# Patient Record
Sex: Female | Born: 1935 | Race: White | Hispanic: No | Smoking: Never smoker
Health system: Southern US, Community
[De-identification: ages and names within clinical notes are randomized; demographics above are authoritative.]

## PROBLEM LIST (undated history)

## (undated) HISTORY — PX: APPENDECTOMY: SHX54

## (undated) HISTORY — PX: ABDOMINAL HYSTERECTOMY: SHX81

## (undated) HISTORY — PX: BLADDER REPAIR: SHX76

---

## 1997-09-08 ENCOUNTER — Ambulatory Visit (HOSPITAL_COMMUNITY): Admission: RE | Admit: 1997-09-08 | Discharge: 1997-09-08 | Payer: Self-pay | Admitting: Internal Medicine

## 1998-03-22 ENCOUNTER — Ambulatory Visit (HOSPITAL_COMMUNITY): Admission: RE | Admit: 1998-03-22 | Discharge: 1998-03-22 | Payer: Self-pay | Admitting: Gastroenterology

## 1998-08-16 ENCOUNTER — Encounter: Payer: Self-pay | Admitting: Internal Medicine

## 1998-08-16 ENCOUNTER — Ambulatory Visit (HOSPITAL_COMMUNITY): Admission: RE | Admit: 1998-08-16 | Discharge: 1998-08-16 | Payer: Self-pay | Admitting: Internal Medicine

## 1999-10-11 ENCOUNTER — Encounter: Payer: Self-pay | Admitting: Internal Medicine

## 1999-10-11 ENCOUNTER — Ambulatory Visit (HOSPITAL_COMMUNITY): Admission: RE | Admit: 1999-10-11 | Discharge: 1999-10-11 | Payer: Self-pay | Admitting: Internal Medicine

## 2000-01-09 ENCOUNTER — Encounter: Admission: RE | Admit: 2000-01-09 | Discharge: 2000-01-09 | Payer: Self-pay | Admitting: Orthopaedic Surgery

## 2000-01-09 ENCOUNTER — Encounter: Payer: Self-pay | Admitting: Orthopaedic Surgery

## 2000-03-27 ENCOUNTER — Encounter: Payer: Self-pay | Admitting: Orthopedic Surgery

## 2000-03-27 ENCOUNTER — Encounter: Admission: RE | Admit: 2000-03-27 | Discharge: 2000-03-27 | Payer: Self-pay | Admitting: Orthopedic Surgery

## 2000-10-12 ENCOUNTER — Encounter: Payer: Self-pay | Admitting: Internal Medicine

## 2000-10-12 ENCOUNTER — Ambulatory Visit (HOSPITAL_COMMUNITY): Admission: RE | Admit: 2000-10-12 | Discharge: 2000-10-12 | Payer: Self-pay | Admitting: Internal Medicine

## 2001-01-02 ENCOUNTER — Other Ambulatory Visit: Admission: RE | Admit: 2001-01-02 | Discharge: 2001-01-02 | Payer: Self-pay | Admitting: Internal Medicine

## 2001-01-09 ENCOUNTER — Encounter: Admission: RE | Admit: 2001-01-09 | Discharge: 2001-01-09 | Payer: Self-pay | Admitting: Internal Medicine

## 2001-01-09 ENCOUNTER — Encounter: Payer: Self-pay | Admitting: Internal Medicine

## 2001-11-20 ENCOUNTER — Ambulatory Visit (HOSPITAL_COMMUNITY): Admission: RE | Admit: 2001-11-20 | Discharge: 2001-11-20 | Payer: Self-pay | Admitting: Internal Medicine

## 2001-11-20 ENCOUNTER — Encounter: Payer: Self-pay | Admitting: Internal Medicine

## 2002-12-02 ENCOUNTER — Encounter: Payer: Self-pay | Admitting: Internal Medicine

## 2002-12-02 ENCOUNTER — Ambulatory Visit (HOSPITAL_COMMUNITY): Admission: RE | Admit: 2002-12-02 | Discharge: 2002-12-02 | Payer: Self-pay | Admitting: Internal Medicine

## 2002-12-07 ENCOUNTER — Emergency Department (HOSPITAL_COMMUNITY): Admission: EM | Admit: 2002-12-07 | Discharge: 2002-12-07 | Payer: Self-pay | Admitting: Emergency Medicine

## 2003-08-28 ENCOUNTER — Other Ambulatory Visit: Admission: RE | Admit: 2003-08-28 | Discharge: 2003-08-28 | Payer: Self-pay | Admitting: Internal Medicine

## 2003-12-03 ENCOUNTER — Ambulatory Visit (HOSPITAL_COMMUNITY): Admission: RE | Admit: 2003-12-03 | Discharge: 2003-12-03 | Payer: Self-pay | Admitting: Internal Medicine

## 2004-04-09 ENCOUNTER — Emergency Department (HOSPITAL_COMMUNITY): Admission: EM | Admit: 2004-04-09 | Discharge: 2004-04-09 | Payer: Self-pay | Admitting: Emergency Medicine

## 2004-06-10 ENCOUNTER — Encounter: Admission: RE | Admit: 2004-06-10 | Discharge: 2004-06-10 | Payer: Self-pay | Admitting: Orthopedic Surgery

## 2004-10-08 ENCOUNTER — Emergency Department (HOSPITAL_COMMUNITY): Admission: EM | Admit: 2004-10-08 | Discharge: 2004-10-09 | Payer: Self-pay | Admitting: Emergency Medicine

## 2004-12-28 ENCOUNTER — Ambulatory Visit (HOSPITAL_COMMUNITY): Admission: RE | Admit: 2004-12-28 | Discharge: 2004-12-28 | Payer: Self-pay | Admitting: Internal Medicine

## 2005-10-10 ENCOUNTER — Observation Stay (HOSPITAL_COMMUNITY): Admission: RE | Admit: 2005-10-10 | Discharge: 2005-10-12 | Payer: Self-pay | Admitting: Obstetrics and Gynecology

## 2005-10-10 ENCOUNTER — Encounter (INDEPENDENT_AMBULATORY_CARE_PROVIDER_SITE_OTHER): Payer: Self-pay | Admitting: Specialist

## 2006-01-11 ENCOUNTER — Ambulatory Visit (HOSPITAL_COMMUNITY): Admission: RE | Admit: 2006-01-11 | Discharge: 2006-01-11 | Payer: Self-pay | Admitting: Internal Medicine

## 2006-02-06 ENCOUNTER — Encounter: Admission: RE | Admit: 2006-02-06 | Discharge: 2006-02-06 | Payer: Self-pay | Admitting: Internal Medicine

## 2006-02-20 ENCOUNTER — Encounter: Admission: RE | Admit: 2006-02-20 | Discharge: 2006-02-20 | Payer: Self-pay | Admitting: Endocrinology

## 2006-02-20 ENCOUNTER — Other Ambulatory Visit: Admission: RE | Admit: 2006-02-20 | Discharge: 2006-02-20 | Payer: Self-pay | Admitting: Interventional Radiology

## 2006-02-20 ENCOUNTER — Encounter (INDEPENDENT_AMBULATORY_CARE_PROVIDER_SITE_OTHER): Payer: Self-pay | Admitting: Specialist

## 2006-04-17 ENCOUNTER — Ambulatory Visit (HOSPITAL_COMMUNITY): Admission: RE | Admit: 2006-04-17 | Discharge: 2006-04-18 | Payer: Self-pay | Admitting: General Surgery

## 2006-04-17 ENCOUNTER — Encounter (INDEPENDENT_AMBULATORY_CARE_PROVIDER_SITE_OTHER): Payer: Self-pay | Admitting: *Deleted

## 2007-01-18 ENCOUNTER — Ambulatory Visit (HOSPITAL_COMMUNITY): Admission: RE | Admit: 2007-01-18 | Discharge: 2007-01-18 | Payer: Self-pay | Admitting: Internal Medicine

## 2008-01-20 ENCOUNTER — Ambulatory Visit (HOSPITAL_COMMUNITY): Admission: RE | Admit: 2008-01-20 | Discharge: 2008-01-20 | Payer: Self-pay | Admitting: Internal Medicine

## 2009-01-26 ENCOUNTER — Ambulatory Visit (HOSPITAL_COMMUNITY): Admission: RE | Admit: 2009-01-26 | Discharge: 2009-01-26 | Payer: Self-pay | Admitting: Internal Medicine

## 2010-01-27 ENCOUNTER — Ambulatory Visit (HOSPITAL_COMMUNITY): Admission: RE | Admit: 2010-01-27 | Discharge: 2010-01-27 | Payer: Self-pay | Admitting: Internal Medicine

## 2010-11-04 NOTE — Op Note (Signed)
[name], Murray NO.:  [id number]   MEDICAL RECORD NO.:  [id number]          PATIENT TYPE:  AMB   LOCATION:  SDC                           FACILITY:  [facility]   PHYSICIAN:  Randye Lobo, M.D.   DATE OF BIRTH:  1931-05-27   DATE OF PROCEDURE:  10/10/2005  DATE OF DISCHARGE:                                 OPERATIVE REPORT   PREOPERATIVE DIAGNOSES:  1.  Incomplete uterovaginal prolapse.  2.  Genuine stress incontinence.   POSTOPERATIVE DIAGNOSES:  1.  Incomplete uterovaginal prolapse.  2.  Genuine stress incontinence.   PROCEDURES:  1.  Total vaginal hysterectomy.  2.  Rogelio Seen culdoplasty.  3.  Anterior and posterior colporrhaphy.  4.  Tension-free vaginal tape.  5.  Cystoscopy.   SURGEON:  Randye Lobo, M.D.   ASSISTANT:  Luvenia Redden, M.D.   ANESTHESIA:  General endotracheal, local with 0.5% lidocaine with 1:200,000  of epinephrine.   IV FLUIDS:  1500 mL Ringer's lactate   ESTIMATED BLOOD LOSS:  75 mL.   URINE OUTPUT:  500 mL.   COMPLICATIONS:  None.   INDICATIONS FOR PROCEDURE:  The patient is a 77 year old Caucasian female  who presented with bladder prolapse and was found to have uterine, anterior  vaginal wall, and posterior vaginal wall prolapse on examination in the  office.  The patient did have evidence of genuine stress incontinence with  multichannel testing in the office.  The patient desires for surgical  treatment of her prolapse and her incontinence, and a plan is made to  proceed with a total vaginal hysterectomy with possible bilateral salpingo-  oophorectomy, anterior and posterior colporrhaphy, a tension-free vaginal  tape and cystoscopy after risks, benefits, and alternatives are discussed.  The patient chooses to proceed.   FINDINGS:  Exam under anesthesia revealed a third degree cystocele, second-  degree uterine prolapse, and a first degree rectocele.  The uterus itself  was noted to be small and unremarkable.  The  tubes and ovaries were very  difficult to visualize as they were noted to be very atrophic and high along  the pelvic sidewalls.   Cystoscopy documented the absence of the foreign body in either the urethra  or the bladder with placement of the TVT.  The ureters were noted to be  patent bilaterally.  The bladder was visualized throughout 360 degrees and  had a normal bladder dome and trigone.   SPECIMENS:  The uterus was sent to pathology.   PROCEDURE:  The patient was reidentified in the preoperative hold area.  She  did receive TED hose and PAS stockings for DVT prophylaxis.  The patient  received clindamycin 900 mg IV for antibiotic prophylaxis.   In the operating room, general endotracheal anesthesia was induced and the  patient was then placed in the dorsal lithotomy position.  The lower  abdomen, vagina and perineum were sterilely prepped and draped and a Foley  catheter was then placed inside the bladder.   A weighted speculum was placed inside the vagina and a single-tooth  tenaculum was placed across the  anterior and the posterior cervical lips.  The cervix was circumferentially injected with 0.5% lidocaine with 1:200,000  of epinephrine.  The cervix was then circumscribed with a scalpel.  The  posterior cul-de-sac was entered sharply with the Mayo scissors and digital  exam confirmed proper entry into the posterior cul-de-sac.  A curved Heaney  clamp was used to clamp each of the uterosacral ligaments, which were then  sharply divided and suture ligated with transfixing sutures of 0 Vicryl.  A  long weighted speculum was placed in the posterior cul-de-sac.  The anterior  cul-de-sac was entered sharply at this time and again digital exam confirmed  proper entry into this location.  Heaney clamps were then used to clamp the  cardinal ligaments, which were sharply divided and suture ligated with 0  Vicryl.  The inferior aspects of the broad ligaments were then clamped,   sharply divided, and suture ligated with 0 Vicryl suture.  A Heaney clamp  was then placed across each of the adnexal pedicles bilaterally.  The  specimen was sharply excised and sent to pathology.  Each of the upper  pedicles were tied with a free tie of 0 Vicryl followed by suture ligature  of the same.  Hemostasis was good.   A short weighted speculum was then placed in the vagina and the posterior  vaginal cuff was closed with a running locked suture of 0 Vicryl, which  created good hemostasis.  A McCall culdoplasty was next performed with a 0  Vicryl suture.  The culdoplasty was performed by bringing the suture through  the vagina and into the posterior cul-de-sac at the 6 o'clock position.  It  was brought through the distal aspect of the uterosacral ligament on the  patient's left-hand side, across the posterior cul-de-sac in a pursestring  fashion, down through the distal right uterosacral ligament and then again  back out through the vagina at the 6 o'clock position.  It was held until  the end of the case, at which time it was tied for excellent support of the  vaginal cuff.   The anterior colporrhaphy was performed next.  Allis clamps were used to  mark the midline of the anterior vaginal wall up to approximately 1 cm below  the urethral meatus.  The anterior vaginal mucosa was injected with 0.5%  lidocaine with 1:200,000 of epinephrine.  The vaginal mucosa was then  incised vertically with a Mayo scissors.  The subvaginal tissue was  dissected off of the overlying vaginal epithelium using sharp dissection.  The dissection was carried back to the level of the pubic rami and down to  the level of the uterosacral ligaments inferiorly.   The TVT was performed next.  The procedure was performed in a top-down  fashion.  Two 1 cm suprapubic incisions were created 2 cm to the right and left of the midline above the pubic symphysis.  The abdominal needle passer  was then placed  through the right suprapubic incision and through the vagina  at the level of the midurethra and lateral to it on the ipsilateral side.  The same procedure that was performed on the right-hand side was then  repeated on the left with that abdominal needle passer.  The Foley catheter  was removed and cystoscopy was performed after the injection of indigo  carmine dye.  The cystoscope was removed and the Foley catheter was replaced  to completely drain the bladder.  The mesh was then attached to the  abdominal needle passers.  The plastic was separated from the surrounding  sling while a Kelly clamp was placed between the urethra and the sling.  Excess sling was then trimmed suprapubically.  The anterior colporrhaphy was  performed with vertical mattress sutures of 2-0 Vicryl, which reduced the  cystocele well.  Excess vaginal mucosa was trimmed and the anterior vaginal  wall was closed with a running locked suture of 2-0 Vicryl.  The suture was  continued along the vaginal cuff to completely close the cuff as well.   The posterior colporrhaphy was performed last.  Allis clamps were placed  along the posterior vaginal mucosa in the midline.  The vaginal mucosa was  injected with 1% lidocaine with 1:200,000 of epinephrine.  A triangular  wedge of epithelium was removed from the perineal body and the posterior  vaginal mucosa was incised vertically with a Mayo scissors.  The perirectal  fascia was dissected off of the overlying vaginal mucosa bilaterally.  The  posterior colporrhaphy was performed using vertical mattress sutures of 2/0  and 0/0 vicryl.  This reduced the rectocele well.  A crown stitch of 0  Vicryl was placed to help support the perineal body.  Excess vaginal mucosa  was trimmed and the posterior vaginal wall was closed with a running locked  suture of 2-0 Vicryl, which continued along the perineum as for an  episiotomy repair.   Rectal exam confirmed the absence of sutures in  the rectum.  The suprapubic  incisions were closed with Dermabond.  A vaginal packing with plain gauze  and Estrace cream was placed and the vagina.   This concluded the patient's surgery.  There were no complications to the  procedure.  All needle, instrument and lap counts were correct.  The patient  was escorted to the recovery room in stable and awake condition.      Randye Lobo, M.D.  Electronically Signed     BES/MEDQ  D:  10/10/2005  T:  10/11/2005  Job:  [id number]

## 2010-11-04 NOTE — Op Note (Signed)
NAMEFERRIN, LIEBIG                 ACCOUNT NO.:  [id number]   MEDICAL RECORD NO.:  [id number]          PATIENT TYPE:  OIB   LOCATION:  1405                         [facility]ACILITY:  [facility]   PHYSICIAN:  Angelia Mould. Derrell Lolling, M.D.DATE OF BIRTH:  January 26, 1931   DATE OF PROCEDURE:  04/17/2006  DATE OF DISCHARGE:                                 OPERATIVE REPORT   PREOPERATIVE DIAGNOSIS:  Bilateral thyroid nodules with indeterminate  cytology.   POSTOPERATIVE DIAGNOSIS:  Bilateral thyroid nodules with indeterminate  cytology.   OPERATION PERFORMED:  Total thyroidectomy.   SURGEON:  Angelia Mould. Derrell Lolling, M.D.   FIRST ASSISTANT:  Dr. Glenna Fellows.   OPERATIVE INDICATIONS:  This is a 77 year old white female who was diagnosed  with hyperactive thyroid 20 years ago and was treated with radioactive  iodine ablative therapy.  She had a fine needle aspiration of a dominant  thyroid nodule 15 years ago and was told that it was benign.  The patient  reported some intermittent dysphonia which sounds more like sinusitis and  sinus drainage.  There is no pressure symptoms or difficulty swallowing.  A  thyroid ultrasound was done which revealed a 2.9 cm solid nodule in the  medial right lobe and 2.2 cm solid nodule in the left upper lobe.  These  both underwent fine needle aspiration cytology and on the left side showed  slight atypia in a microfollicular pattern consistent with hyperplastic  nodule versus follicular lesion or neoplasm.  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  She was advised to have thyroidectomy by Dr. Talmage Nap and the  patient was referred to me.  I felt that total thyroidectomy was probably  the most appropriate management at this time.  The patient was counseled  regarding this as an outpatient.  She agrees with the plan.   OPERATIVE FINDINGS:  The patient had a prominent smooth solid nodule in the  right lobe  medially, not really in the isthmus but fairly anterior. There  was a smaller nodule in the left mid to upper lobe.  There was no  significant adenopathy.  The thyroid gland itself was not enlarged.   OPERATIVE TECHNIQUE:  Following induction of general endotracheal  anesthesia, the patient was identified as to correct procedure and patient.  The neck was prepped and draped in sterile fashion.  A curved transverse  neck incision was made.  Dissection was carried down through the platysma  muscle.  Skin and platysma flaps were raised superiorly and inferiorly and a  self-retaining retractor was placed.  Strap muscles were divided in the  midline and dissected off of the thyroid lobe on both sides.   I dissected the left lobe first.  First I isolated the superior thyroid  vessels and ligated them in continuity with 2-0 silk ties and also placed a  medium metal clip superiorly for security and then divided this.  I then  mobilized the inferior left lobe by dividing venous channels between metal  clips.  I then dissected the thyroid gland  from lateral to medial.  We  clearly identified the inferior parathyroid gland and preserved it and I  thought I saw the superior one as well.  We kept the dissection right  thyroid capsule controlling venous and arterial channels with metal clips.  We identified the recurrent laryngeal nerve on the left side and it was  preserved.  We dissected this up off the trachea without much difficulty.   A similar dissection occurred on the right side, first with isolation and  control of the superior thyroid vessels with 2-0 silk ties and metal clips.  There were a few venous channels holding the inferior pole on the right and  these were divided between metal clips.  I dissected out the inferior  parathyroid gland and preserved that.  The thyroid gland on the right was  dissected from lateral to medial preserving all of the structures.  I did  not exactly see the  recurrent laryngeal nerve on the right side but I felt  that I stayed well way from it due to dissection kept in the capsule of the  thyroid gland.  The thyroid was then dissected off the trachea.  I marked  the left upper pole with silk suture and sent it to the lab for permanent  histology.  I did not feel any adenopathy anywhere in the neck.  The wound  was irrigated with saline.  Surgicel gauze was placed in the bed of the  thyroid on both sides and observed for 5 minutes and it was completely dry.  The strap muscles were closed in midline with interrupted sutures of 3-0  Vicryl.  Platysma muscle was closed with 3-0 Vicryl sutures and then the  skin closed with running subcuticular suture of 4-0 Monocryl and Steri-  Strips.  Clean bandages were placed and the patient was taken to the  recovery room in stable condition.   ESTIMATED BLOOD LOSS:  About 20 mL.   COMPLICATIONS:  None.   Sponge, needle and instrument counts were correct.      Angelia Mould. Derrell Lolling, M.D.  Electronically Signed     HMI/MEDQ  D:  04/17/2006  T:  04/17/2006  Job:  [id number]   cc:   Ladell Pier, M.D.  Fax: [phone]   Dorisann Frames, M.D.  Fax: [phone]

## 2010-12-23 ENCOUNTER — Other Ambulatory Visit (HOSPITAL_COMMUNITY): Payer: Self-pay

## 2010-12-23 DIAGNOSIS — Z1231 Encounter for screening mammogram for malignant neoplasm of breast: Secondary | ICD-10-CM

## 2011-01-30 ENCOUNTER — Ambulatory Visit (HOSPITAL_COMMUNITY)
Admission: RE | Admit: 2011-01-30 | Discharge: 2011-01-30 | Disposition: A | Discharge status: Home / Self Care | Payer: Medicare Other | Source: Ambulatory Visit | Attending: Family Medicine | Admitting: Family Medicine

## 2011-01-30 DIAGNOSIS — Z1231 Encounter for screening mammogram for malignant neoplasm of breast: Secondary | ICD-10-CM

## 2012-01-31 ENCOUNTER — Other Ambulatory Visit (HOSPITAL_COMMUNITY): Payer: Self-pay | Admitting: Family Medicine

## 2012-01-31 DIAGNOSIS — Z1231 Encounter for screening mammogram for malignant neoplasm of breast: Secondary | ICD-10-CM

## 2012-02-20 ENCOUNTER — Ambulatory Visit (HOSPITAL_COMMUNITY)
Admission: RE | Admit: 2012-02-20 | Discharge: 2012-02-20 | Disposition: A | Discharge status: Home / Self Care | Payer: Medicare Other | Source: Ambulatory Visit | Attending: Family Medicine | Admitting: Family Medicine

## 2012-02-20 DIAGNOSIS — Z1231 Encounter for screening mammogram for malignant neoplasm of breast: Secondary | ICD-10-CM

## 2012-06-03 ENCOUNTER — Ambulatory Visit: Payer: Medicare Other | Admitting: Family Medicine

## 2013-01-21 ENCOUNTER — Other Ambulatory Visit (HOSPITAL_COMMUNITY): Payer: Self-pay | Admitting: Family Medicine

## 2013-01-21 DIAGNOSIS — Z1231 Encounter for screening mammogram for malignant neoplasm of breast: Secondary | ICD-10-CM

## 2013-02-20 ENCOUNTER — Ambulatory Visit (HOSPITAL_COMMUNITY)
Admission: RE | Admit: 2013-02-20 | Discharge: 2013-02-20 | Disposition: A | Discharge status: Home / Self Care | Payer: Medicare Other | Source: Ambulatory Visit | Attending: Family Medicine | Admitting: Family Medicine

## 2013-02-20 DIAGNOSIS — Z1231 Encounter for screening mammogram for malignant neoplasm of breast: Secondary | ICD-10-CM | POA: Insufficient documentation

## 2014-01-19 ENCOUNTER — Other Ambulatory Visit (HOSPITAL_COMMUNITY): Payer: Self-pay | Admitting: Family Medicine

## 2014-01-19 DIAGNOSIS — Z1231 Encounter for screening mammogram for malignant neoplasm of breast: Secondary | ICD-10-CM

## 2014-02-24 ENCOUNTER — Ambulatory Visit (HOSPITAL_COMMUNITY)
Admission: RE | Admit: 2014-02-24 | Discharge: 2014-02-24 | Disposition: A | Discharge status: Home / Self Care | Payer: Medicare Other | Source: Ambulatory Visit | Attending: Family Medicine | Admitting: Family Medicine

## 2014-02-24 DIAGNOSIS — Z1231 Encounter for screening mammogram for malignant neoplasm of breast: Secondary | ICD-10-CM | POA: Insufficient documentation

## 2015-02-25 ENCOUNTER — Other Ambulatory Visit (HOSPITAL_COMMUNITY): Payer: Self-pay | Admitting: Family Medicine

## 2015-02-25 DIAGNOSIS — Z1231 Encounter for screening mammogram for malignant neoplasm of breast: Secondary | ICD-10-CM

## 2015-03-02 ENCOUNTER — Ambulatory Visit (HOSPITAL_COMMUNITY)
Admission: RE | Admit: 2015-03-02 | Discharge: 2015-03-02 | Disposition: A | Discharge status: Home / Self Care | Payer: PPO | Source: Ambulatory Visit | Attending: Family Medicine | Admitting: Family Medicine

## 2015-03-02 DIAGNOSIS — Z1231 Encounter for screening mammogram for malignant neoplasm of breast: Secondary | ICD-10-CM | POA: Diagnosis present

## 2015-09-03 DIAGNOSIS — R309 Painful micturition, unspecified: Secondary | ICD-10-CM | POA: Diagnosis not present

## 2015-09-03 DIAGNOSIS — N390 Urinary tract infection, site not specified: Secondary | ICD-10-CM | POA: Diagnosis not present

## 2015-10-06 DIAGNOSIS — Z79899 Other long term (current) drug therapy: Secondary | ICD-10-CM | POA: Diagnosis not present

## 2015-10-06 DIAGNOSIS — K219 Gastro-esophageal reflux disease without esophagitis: Secondary | ICD-10-CM | POA: Diagnosis not present

## 2015-10-06 DIAGNOSIS — Z0000 Encounter for general adult medical examination without abnormal findings: Secondary | ICD-10-CM | POA: Diagnosis not present

## 2015-10-06 DIAGNOSIS — E039 Hypothyroidism, unspecified: Secondary | ICD-10-CM | POA: Diagnosis not present

## 2015-10-06 DIAGNOSIS — M8000XA Age-related osteoporosis with current pathological fracture, unspecified site, initial encounter for fracture: Secondary | ICD-10-CM | POA: Diagnosis not present

## 2015-10-06 DIAGNOSIS — E785 Hyperlipidemia, unspecified: Secondary | ICD-10-CM | POA: Diagnosis not present

## 2015-10-06 DIAGNOSIS — S32000A Wedge compression fracture of unspecified lumbar vertebra, initial encounter for closed fracture: Secondary | ICD-10-CM | POA: Diagnosis not present

## 2015-10-06 DIAGNOSIS — L719 Rosacea, unspecified: Secondary | ICD-10-CM | POA: Diagnosis not present

## 2015-10-06 DIAGNOSIS — B079 Viral wart, unspecified: Secondary | ICD-10-CM | POA: Diagnosis not present

## 2015-10-28 DIAGNOSIS — M810 Age-related osteoporosis without current pathological fracture: Secondary | ICD-10-CM | POA: Diagnosis not present

## 2015-10-28 DIAGNOSIS — M4120 Other idiopathic scoliosis, site unspecified: Secondary | ICD-10-CM | POA: Diagnosis not present

## 2016-02-08 ENCOUNTER — Other Ambulatory Visit: Payer: Self-pay | Admitting: Family Medicine

## 2016-02-08 DIAGNOSIS — Z1231 Encounter for screening mammogram for malignant neoplasm of breast: Secondary | ICD-10-CM

## 2016-02-23 DIAGNOSIS — J189 Pneumonia, unspecified organism: Secondary | ICD-10-CM | POA: Diagnosis not present

## 2016-03-02 ENCOUNTER — Ambulatory Visit: Payer: PPO

## 2016-03-02 DIAGNOSIS — J189 Pneumonia, unspecified organism: Secondary | ICD-10-CM | POA: Diagnosis not present

## 2016-03-02 DIAGNOSIS — Z23 Encounter for immunization: Secondary | ICD-10-CM | POA: Diagnosis not present

## 2016-03-16 ENCOUNTER — Ambulatory Visit: Payer: PPO

## 2016-04-04 ENCOUNTER — Other Ambulatory Visit: Payer: Self-pay | Admitting: Family Medicine

## 2016-04-04 ENCOUNTER — Ambulatory Visit
Admission: RE | Admit: 2016-04-04 | Discharge: 2016-04-04 | Disposition: A | Discharge status: Home / Self Care | Payer: PPO | Source: Ambulatory Visit | Attending: Family Medicine | Admitting: Family Medicine

## 2016-04-04 DIAGNOSIS — J189 Pneumonia, unspecified organism: Secondary | ICD-10-CM

## 2016-04-04 DIAGNOSIS — R05 Cough: Secondary | ICD-10-CM | POA: Diagnosis not present

## 2016-06-05 DIAGNOSIS — R5383 Other fatigue: Secondary | ICD-10-CM | POA: Diagnosis not present

## 2016-06-05 DIAGNOSIS — E559 Vitamin D deficiency, unspecified: Secondary | ICD-10-CM | POA: Diagnosis not present

## 2016-06-05 DIAGNOSIS — M810 Age-related osteoporosis without current pathological fracture: Secondary | ICD-10-CM | POA: Diagnosis not present

## 2016-06-09 DIAGNOSIS — M25511 Pain in right shoulder: Secondary | ICD-10-CM | POA: Diagnosis not present

## 2016-06-09 DIAGNOSIS — M4120 Other idiopathic scoliosis, site unspecified: Secondary | ICD-10-CM | POA: Diagnosis not present

## 2016-06-09 DIAGNOSIS — M810 Age-related osteoporosis without current pathological fracture: Secondary | ICD-10-CM | POA: Diagnosis not present

## 2016-06-09 DIAGNOSIS — M25521 Pain in right elbow: Secondary | ICD-10-CM | POA: Diagnosis not present

## 2016-10-09 DIAGNOSIS — M8000XA Age-related osteoporosis with current pathological fracture, unspecified site, initial encounter for fracture: Secondary | ICD-10-CM | POA: Diagnosis not present

## 2016-10-09 DIAGNOSIS — E039 Hypothyroidism, unspecified: Secondary | ICD-10-CM | POA: Diagnosis not present

## 2016-10-09 DIAGNOSIS — Z79899 Other long term (current) drug therapy: Secondary | ICD-10-CM | POA: Diagnosis not present

## 2016-10-09 DIAGNOSIS — E785 Hyperlipidemia, unspecified: Secondary | ICD-10-CM | POA: Diagnosis not present

## 2016-10-09 DIAGNOSIS — Z0000 Encounter for general adult medical examination without abnormal findings: Secondary | ICD-10-CM | POA: Diagnosis not present

## 2016-10-09 DIAGNOSIS — K449 Diaphragmatic hernia without obstruction or gangrene: Secondary | ICD-10-CM | POA: Diagnosis not present

## 2016-10-09 DIAGNOSIS — M4850XA Collapsed vertebra, not elsewhere classified, site unspecified, initial encounter for fracture: Secondary | ICD-10-CM | POA: Diagnosis not present

## 2016-10-09 DIAGNOSIS — R0602 Shortness of breath: Secondary | ICD-10-CM | POA: Diagnosis not present

## 2016-10-09 DIAGNOSIS — N183 Chronic kidney disease, stage 3 (moderate): Secondary | ICD-10-CM | POA: Diagnosis not present

## 2016-10-09 DIAGNOSIS — F324 Major depressive disorder, single episode, in partial remission: Secondary | ICD-10-CM | POA: Diagnosis not present

## 2016-10-09 DIAGNOSIS — I10 Essential (primary) hypertension: Secondary | ICD-10-CM | POA: Diagnosis not present

## 2016-10-09 DIAGNOSIS — Z23 Encounter for immunization: Secondary | ICD-10-CM | POA: Diagnosis not present

## 2016-10-10 ENCOUNTER — Other Ambulatory Visit: Payer: Self-pay | Admitting: Family Medicine

## 2016-10-10 DIAGNOSIS — R0602 Shortness of breath: Secondary | ICD-10-CM

## 2016-10-19 ENCOUNTER — Other Ambulatory Visit: Payer: Self-pay

## 2016-10-19 ENCOUNTER — Ambulatory Visit (HOSPITAL_COMMUNITY): Payer: PPO | Attending: Cardiology

## 2016-10-19 DIAGNOSIS — R0602 Shortness of breath: Secondary | ICD-10-CM

## 2016-10-19 DIAGNOSIS — I371 Nonrheumatic pulmonary valve insufficiency: Secondary | ICD-10-CM | POA: Diagnosis not present

## 2016-10-19 DIAGNOSIS — I071 Rheumatic tricuspid insufficiency: Secondary | ICD-10-CM | POA: Insufficient documentation

## 2016-12-13 DIAGNOSIS — E559 Vitamin D deficiency, unspecified: Secondary | ICD-10-CM | POA: Diagnosis not present

## 2016-12-13 DIAGNOSIS — M810 Age-related osteoporosis without current pathological fracture: Secondary | ICD-10-CM | POA: Diagnosis not present

## 2017-03-09 DIAGNOSIS — J069 Acute upper respiratory infection, unspecified: Secondary | ICD-10-CM | POA: Diagnosis not present

## 2017-04-26 DIAGNOSIS — Z23 Encounter for immunization: Secondary | ICD-10-CM | POA: Diagnosis not present

## 2017-06-18 DIAGNOSIS — E2839 Other primary ovarian failure: Secondary | ICD-10-CM | POA: Diagnosis not present

## 2017-06-18 DIAGNOSIS — M810 Age-related osteoporosis without current pathological fracture: Secondary | ICD-10-CM | POA: Diagnosis not present

## 2017-06-25 DIAGNOSIS — E559 Vitamin D deficiency, unspecified: Secondary | ICD-10-CM | POA: Diagnosis not present

## 2017-06-25 DIAGNOSIS — M810 Age-related osteoporosis without current pathological fracture: Secondary | ICD-10-CM | POA: Diagnosis not present

## 2017-06-25 DIAGNOSIS — R5383 Other fatigue: Secondary | ICD-10-CM | POA: Diagnosis not present

## 2017-06-27 DIAGNOSIS — M810 Age-related osteoporosis without current pathological fracture: Secondary | ICD-10-CM | POA: Diagnosis not present

## 2017-06-27 DIAGNOSIS — M1712 Unilateral primary osteoarthritis, left knee: Secondary | ICD-10-CM | POA: Diagnosis not present

## 2017-09-13 DIAGNOSIS — R1310 Dysphagia, unspecified: Secondary | ICD-10-CM | POA: Diagnosis not present

## 2017-09-13 DIAGNOSIS — Z1211 Encounter for screening for malignant neoplasm of colon: Secondary | ICD-10-CM | POA: Diagnosis not present

## 2017-09-27 DIAGNOSIS — F324 Major depressive disorder, single episode, in partial remission: Secondary | ICD-10-CM | POA: Diagnosis not present

## 2017-09-27 DIAGNOSIS — J329 Chronic sinusitis, unspecified: Secondary | ICD-10-CM | POA: Diagnosis not present

## 2017-09-27 DIAGNOSIS — K146 Glossodynia: Secondary | ICD-10-CM | POA: Diagnosis not present

## 2017-11-02 DIAGNOSIS — N183 Chronic kidney disease, stage 3 (moderate): Secondary | ICD-10-CM | POA: Diagnosis not present

## 2017-11-02 DIAGNOSIS — F324 Major depressive disorder, single episode, in partial remission: Secondary | ICD-10-CM | POA: Diagnosis not present

## 2017-11-02 DIAGNOSIS — Z0000 Encounter for general adult medical examination without abnormal findings: Secondary | ICD-10-CM | POA: Diagnosis not present

## 2017-11-02 DIAGNOSIS — E039 Hypothyroidism, unspecified: Secondary | ICD-10-CM | POA: Diagnosis not present

## 2017-11-02 DIAGNOSIS — E785 Hyperlipidemia, unspecified: Secondary | ICD-10-CM | POA: Diagnosis not present

## 2017-11-02 DIAGNOSIS — D692 Other nonthrombocytopenic purpura: Secondary | ICD-10-CM | POA: Diagnosis not present

## 2017-11-02 DIAGNOSIS — Z79899 Other long term (current) drug therapy: Secondary | ICD-10-CM | POA: Diagnosis not present

## 2017-11-02 DIAGNOSIS — I10 Essential (primary) hypertension: Secondary | ICD-10-CM | POA: Diagnosis not present

## 2017-11-02 DIAGNOSIS — M8000XA Age-related osteoporosis with current pathological fracture, unspecified site, initial encounter for fracture: Secondary | ICD-10-CM | POA: Diagnosis not present

## 2017-11-02 DIAGNOSIS — M4850XA Collapsed vertebra, not elsewhere classified, site unspecified, initial encounter for fracture: Secondary | ICD-10-CM | POA: Diagnosis not present

## 2017-11-02 DIAGNOSIS — K589 Irritable bowel syndrome without diarrhea: Secondary | ICD-10-CM | POA: Diagnosis not present

## 2017-11-02 DIAGNOSIS — N200 Calculus of kidney: Secondary | ICD-10-CM | POA: Diagnosis not present

## 2017-12-26 DIAGNOSIS — M810 Age-related osteoporosis without current pathological fracture: Secondary | ICD-10-CM | POA: Diagnosis not present

## 2017-12-26 DIAGNOSIS — E559 Vitamin D deficiency, unspecified: Secondary | ICD-10-CM | POA: Diagnosis not present

## 2018-01-20 ENCOUNTER — Emergency Department (HOSPITAL_COMMUNITY)
Admission: EM | Admit: 2018-01-20 | Discharge: 2018-01-20 | Disposition: A | Discharge status: Home / Self Care | Payer: PPO | Attending: Emergency Medicine | Admitting: Emergency Medicine

## 2018-01-20 ENCOUNTER — Emergency Department (HOSPITAL_COMMUNITY): Payer: PPO

## 2018-01-20 ENCOUNTER — Encounter (HOSPITAL_COMMUNITY): Payer: Self-pay | Admitting: Emergency Medicine

## 2018-01-20 DIAGNOSIS — R42 Dizziness and giddiness: Secondary | ICD-10-CM | POA: Diagnosis not present

## 2018-01-20 DIAGNOSIS — I10 Essential (primary) hypertension: Secondary | ICD-10-CM | POA: Diagnosis not present

## 2018-01-20 DIAGNOSIS — R079 Chest pain, unspecified: Secondary | ICD-10-CM

## 2018-01-20 DIAGNOSIS — R531 Weakness: Secondary | ICD-10-CM | POA: Diagnosis not present

## 2018-01-20 DIAGNOSIS — I491 Atrial premature depolarization: Secondary | ICD-10-CM | POA: Diagnosis not present

## 2018-01-20 DIAGNOSIS — R072 Precordial pain: Secondary | ICD-10-CM | POA: Diagnosis not present

## 2018-01-20 LAB — BASIC METABOLIC PANEL
Anion gap: 14 (ref 5–15)
BUN: 14 mg/dL (ref 8–23)
CO2: 21 mmol/L — ABNORMAL LOW (ref 22–32)
Calcium: 9.5 mg/dL (ref 8.9–10.3)
Chloride: 107 mmol/L (ref 98–111)
Creatinine, Ser: 0.87 mg/dL (ref 0.44–1.00)
GFR calc Af Amer: >60 mL/min (ref >60)
GFR calc non Af Amer: 59 mL/min — ABNORMAL LOW (ref >60)
Glucose, Bld: 140 mg/dL — ABNORMAL HIGH (ref 70–99)
Potassium: 3.7 mmol/L (ref 3.5–5.1)
Sodium: 142 mmol/L (ref 135–145)

## 2018-01-20 LAB — TROPONIN I: Troponin I: <0.03 ng/mL (ref <0.03)

## 2018-01-20 LAB — CBC WITH DIFFERENTIAL/PLATELET
Abs Immature Granulocytes: 0 10*3/uL (ref 0.0–0.1)
Basophils Absolute: 0.1 10*3/uL (ref 0.0–0.1)
Basophils Relative: 1 %
Eosinophils Absolute: 0 10*3/uL (ref 0.0–0.7)
Eosinophils Relative: 0 %
HCT: 44.5 % (ref 36.0–46.0)
Hemoglobin: 14.1 g/dL (ref 12.0–15.0)
Immature Granulocytes: 0 %
Lymphocytes Relative: 11 %
Lymphs Abs: 1.1 10*3/uL (ref 0.7–4.0)
MCH: 31.3 pg (ref 26.0–34.0)
MCHC: 31.7 g/dL (ref 30.0–36.0)
MCV: 98.9 fL (ref 78.0–100.0)
Monocytes Absolute: 0.2 10*3/uL (ref 0.1–1.0)
Monocytes Relative: 2 %
Neutro Abs: 8.4 10*3/uL — ABNORMAL HIGH (ref 1.7–7.7)
Neutrophils Relative %: 86 %
Platelets: 184 10*3/uL (ref 150–400)
RBC: 4.5 MIL/uL (ref 3.87–5.11)
RDW: 13 % (ref 11.5–15.5)
WBC: 9.8 10*3/uL (ref 4.0–10.5)

## 2018-01-20 MED ORDER — MECLIZINE HCL 25 MG PO TABS
12.5000 mg | ORAL_TABLET | Freq: Once | ORAL | Status: AC
  Administered 2018-01-20: 12.5 mg via ORAL
  Filled 2018-01-20: qty 1

## 2018-01-20 MED ORDER — MECLIZINE HCL 12.5 MG PO TABS: 12.5000 mg | ORAL_TABLET | Freq: Three times a day (TID) | ORAL | 0 refills | Status: DC | PRN

## 2018-01-20 NOTE — ED Notes (Signed)
Patient transported to X-ray 

## 2018-01-20 NOTE — ED Triage Notes (Signed)
Pt here from home with c/o chest pain that started about 4 am , pt was also nauseated and dizzy , no chest pain at present

## 2018-01-20 NOTE — ED Provider Notes (Signed)
[facility] EMERGENCY DEPARTMENT Provider Note   CSN: [id number] Arrival date & time: 01/20/18  0751     History   Chief Complaint Chief Complaint  Patient presents with  . Chest Pain    HPI Lisa Murray is a 84 y.o. female.  HPI   84 year old female with chest pain and dizziness.  She was laying in bed around 4 AM when symptoms started.  Pain is in the lower sternal area.  She says that it feels sore.  Improved since onset.  She subsequently got up to use the bathroom and felt very dizzy.  She describes sensation of feeling very off balance and having to put out her hands to balance herself.  She felt nauseated and did vomit once.  The dizziness felt better when still..  Currently laying in bed she is not experiencing it anymore.  She does not feel like her chest pain was any different when she was up moving around.  History reviewed. No pertinent past medical history.  There are no active problems to display for this patient.   Past Surgical History:  Procedure Laterality Date  . ABDOMINAL HYSTERECTOMY    . APPENDECTOMY    . BLADDER REPAIR       OB History   None      Home Medications    Prior to Admission medications   Not on File    Family History History reviewed. No pertinent family history.  Social History Social History   Tobacco Use  . Smoking status: Never Smoker  . Smokeless tobacco: Never Used  Substance Use Topics  . Alcohol use: Never    Frequency: Never  . Drug use: Not on file     Allergies   Patient has no allergy information on record.   Review of Systems Review of Systems  All systems reviewed and negative, other than as noted in HPI.  Physical Exam Updated Vital Signs BP (!) 137/100 (BP Location: Left Arm)   Pulse 88   Temp 97.6 F (36.4 C) (Oral)   Resp (!) 22   SpO2 98%   Physical Exam  Constitutional: She appears well-developed and well-nourished. No distress.  HENT:  Head: Normocephalic and  atraumatic.  Eyes: Conjunctivae are normal. Right eye exhibits no discharge. Left eye exhibits no discharge.  Neck: Neck supple.  Cardiovascular: Normal rate, regular rhythm and normal heart sounds. Exam reveals no gallop and no friction rub.  No murmur heard. Pulmonary/Chest: Effort normal and breath sounds normal. No respiratory distress. She exhibits tenderness.  Pain is very reproducible with palpation in the depicted area.  She winces in pain with touch. No overlying skin changes.    Abdominal: Soft. She exhibits no distension. There is no tenderness.  Musculoskeletal: She exhibits no edema or tenderness.  Lower extremities symmetric as compared to each other. No calf tenderness. Negative Homan's. No palpable cords.   Neurological: She is alert.  Skin: Skin is warm and dry.  Psychiatric: She has a normal mood and affect. Her behavior is normal. Thought content normal.  Nursing note and vitals reviewed.    ED Treatments / Results  Labs (all labs ordered are listed, but only abnormal results are displayed) Labs Reviewed  CBC WITH DIFFERENTIAL/PLATELET - Abnormal; Notable for the following components:      Result Value   Neutro Abs 8.4 (*)    All other components within normal limits  BASIC METABOLIC PANEL - Abnormal; Notable for the following components:  CO2 21 (*)    Glucose, Bld 140 (*)    GFR calc non Af Amer 59 (*)    All other components within normal limits  TROPONIN I    EKG None  Radiology No results found.  Procedures Procedures (including critical care time)  Medications Ordered in ED Medications - No data to display   Initial Impression / Assessment and Plan / ED Course  I have reviewed the triage vital signs and the nursing notes.  Pertinent labs & imaging results that were available during my care of the patient were reviewed by me and considered in my medical decision making (see chart for details).     84 year old female with chest pain.  I  suspect that this might be musculoskeletal.  It is very easy to reproduce with palpation in her lower sternum and along the left parasternal border.  Seems atypical for ACS.  Doubt PE, dissection or other emergent process.  I am not sure of her "dizziness" is directly related.  The way she describes it, it sounds like vertigo.  She noticed it as she was getting up out of bed.  Felt very off balance and needed to grab onto things to steady herself.  Nauseated was associated with this.  Feels better at rest.  Nonfocal neurological examination.  Final Clinical Impressions(s) / ED Diagnoses   Final diagnoses:  Chest pain, unspecified type  Vertigo    ED Discharge Orders    None       Raeford RazorKohut, Sherry Rogus, MD 01/23/18 1554

## 2018-01-20 NOTE — ED Notes (Addendum)
Pt given water to rinse out her mouth per Dr. Juleen ChinaKohut

## 2018-01-21 DIAGNOSIS — N390 Urinary tract infection, site not specified: Secondary | ICD-10-CM | POA: Diagnosis not present

## 2018-01-21 DIAGNOSIS — R0789 Other chest pain: Secondary | ICD-10-CM | POA: Diagnosis not present

## 2018-01-22 DIAGNOSIS — N390 Urinary tract infection, site not specified: Secondary | ICD-10-CM | POA: Diagnosis not present

## 2018-04-05 DIAGNOSIS — Z23 Encounter for immunization: Secondary | ICD-10-CM | POA: Diagnosis not present

## 2018-05-06 DIAGNOSIS — R109 Unspecified abdominal pain: Secondary | ICD-10-CM | POA: Diagnosis not present

## 2018-05-06 DIAGNOSIS — F324 Major depressive disorder, single episode, in partial remission: Secondary | ICD-10-CM | POA: Diagnosis not present

## 2018-05-06 DIAGNOSIS — M4850XA Collapsed vertebra, not elsewhere classified, site unspecified, initial encounter for fracture: Secondary | ICD-10-CM | POA: Diagnosis not present

## 2018-05-06 DIAGNOSIS — R51 Headache: Secondary | ICD-10-CM | POA: Diagnosis not present

## 2018-05-20 DIAGNOSIS — J019 Acute sinusitis, unspecified: Secondary | ICD-10-CM | POA: Diagnosis not present

## 2018-07-26 DIAGNOSIS — F4001 Agoraphobia with panic disorder: Secondary | ICD-10-CM | POA: Diagnosis not present

## 2018-07-26 DIAGNOSIS — W19XXXA Unspecified fall, initial encounter: Secondary | ICD-10-CM | POA: Diagnosis not present

## 2018-07-26 DIAGNOSIS — S0081XA Abrasion of other part of head, initial encounter: Secondary | ICD-10-CM | POA: Diagnosis not present

## 2018-08-20 DIAGNOSIS — R5383 Other fatigue: Secondary | ICD-10-CM | POA: Diagnosis not present

## 2018-08-20 DIAGNOSIS — M810 Age-related osteoporosis without current pathological fracture: Secondary | ICD-10-CM | POA: Diagnosis not present

## 2018-08-20 DIAGNOSIS — E559 Vitamin D deficiency, unspecified: Secondary | ICD-10-CM | POA: Diagnosis not present

## 2018-08-22 DIAGNOSIS — M810 Age-related osteoporosis without current pathological fracture: Secondary | ICD-10-CM | POA: Diagnosis not present

## 2018-08-22 DIAGNOSIS — M1712 Unilateral primary osteoarthritis, left knee: Secondary | ICD-10-CM | POA: Diagnosis not present

## 2019-01-06 DIAGNOSIS — Z0000 Encounter for general adult medical examination without abnormal findings: Secondary | ICD-10-CM | POA: Diagnosis not present

## 2019-01-06 DIAGNOSIS — M8000XA Age-related osteoporosis with current pathological fracture, unspecified site, initial encounter for fracture: Secondary | ICD-10-CM | POA: Diagnosis not present

## 2019-01-06 DIAGNOSIS — I10 Essential (primary) hypertension: Secondary | ICD-10-CM | POA: Diagnosis not present

## 2019-01-06 DIAGNOSIS — E785 Hyperlipidemia, unspecified: Secondary | ICD-10-CM | POA: Diagnosis not present

## 2019-01-06 DIAGNOSIS — E441 Mild protein-calorie malnutrition: Secondary | ICD-10-CM | POA: Diagnosis not present

## 2019-01-06 DIAGNOSIS — E039 Hypothyroidism, unspecified: Secondary | ICD-10-CM | POA: Diagnosis not present

## 2019-01-06 DIAGNOSIS — N183 Chronic kidney disease, stage 3 (moderate): Secondary | ICD-10-CM | POA: Diagnosis not present

## 2019-01-06 DIAGNOSIS — Z79899 Other long term (current) drug therapy: Secondary | ICD-10-CM | POA: Diagnosis not present

## 2019-03-13 DIAGNOSIS — M1712 Unilateral primary osteoarthritis, left knee: Secondary | ICD-10-CM | POA: Diagnosis not present

## 2019-03-13 DIAGNOSIS — M810 Age-related osteoporosis without current pathological fracture: Secondary | ICD-10-CM | POA: Diagnosis not present

## 2019-04-10 DIAGNOSIS — M8000XA Age-related osteoporosis with current pathological fracture, unspecified site, initial encounter for fracture: Secondary | ICD-10-CM | POA: Diagnosis not present

## 2019-04-10 DIAGNOSIS — F324 Major depressive disorder, single episode, in partial remission: Secondary | ICD-10-CM | POA: Diagnosis not present

## 2019-04-10 DIAGNOSIS — K5900 Constipation, unspecified: Secondary | ICD-10-CM | POA: Diagnosis not present

## 2019-08-14 DIAGNOSIS — F324 Major depressive disorder, single episode, in partial remission: Secondary | ICD-10-CM | POA: Diagnosis not present

## 2019-10-07 DIAGNOSIS — R5383 Other fatigue: Secondary | ICD-10-CM | POA: Diagnosis not present

## 2019-10-07 DIAGNOSIS — E559 Vitamin D deficiency, unspecified: Secondary | ICD-10-CM | POA: Diagnosis not present

## 2019-10-07 DIAGNOSIS — M1712 Unilateral primary osteoarthritis, left knee: Secondary | ICD-10-CM | POA: Diagnosis not present

## 2019-10-07 DIAGNOSIS — M810 Age-related osteoporosis without current pathological fracture: Secondary | ICD-10-CM | POA: Diagnosis not present

## 2019-10-14 DIAGNOSIS — E559 Vitamin D deficiency, unspecified: Secondary | ICD-10-CM | POA: Diagnosis not present

## 2019-10-14 DIAGNOSIS — M810 Age-related osteoporosis without current pathological fracture: Secondary | ICD-10-CM | POA: Diagnosis not present

## 2019-10-23 DIAGNOSIS — K5909 Other constipation: Secondary | ICD-10-CM | POA: Diagnosis not present

## 2019-10-23 DIAGNOSIS — F410 Panic disorder [episodic paroxysmal anxiety] without agoraphobia: Secondary | ICD-10-CM | POA: Diagnosis not present

## 2019-11-12 DIAGNOSIS — M25551 Pain in right hip: Secondary | ICD-10-CM | POA: Diagnosis not present

## 2019-11-12 DIAGNOSIS — M810 Age-related osteoporosis without current pathological fracture: Secondary | ICD-10-CM | POA: Diagnosis not present

## 2019-11-12 DIAGNOSIS — M25561 Pain in right knee: Secondary | ICD-10-CM | POA: Diagnosis not present

## 2020-01-12 DIAGNOSIS — E041 Nontoxic single thyroid nodule: Secondary | ICD-10-CM | POA: Diagnosis not present

## 2020-01-12 DIAGNOSIS — N183 Chronic kidney disease, stage 3 unspecified: Secondary | ICD-10-CM | POA: Diagnosis not present

## 2020-01-12 DIAGNOSIS — K219 Gastro-esophageal reflux disease without esophagitis: Secondary | ICD-10-CM | POA: Diagnosis not present

## 2020-01-12 DIAGNOSIS — E785 Hyperlipidemia, unspecified: Secondary | ICD-10-CM | POA: Diagnosis not present

## 2020-01-12 DIAGNOSIS — I10 Essential (primary) hypertension: Secondary | ICD-10-CM | POA: Diagnosis not present

## 2020-01-12 DIAGNOSIS — E039 Hypothyroidism, unspecified: Secondary | ICD-10-CM | POA: Diagnosis not present

## 2020-01-12 DIAGNOSIS — R7301 Impaired fasting glucose: Secondary | ICD-10-CM | POA: Diagnosis not present

## 2020-01-12 DIAGNOSIS — F339 Major depressive disorder, recurrent, unspecified: Secondary | ICD-10-CM | POA: Diagnosis not present

## 2020-01-12 DIAGNOSIS — Z79899 Other long term (current) drug therapy: Secondary | ICD-10-CM | POA: Diagnosis not present

## 2020-01-12 DIAGNOSIS — M8000XA Age-related osteoporosis with current pathological fracture, unspecified site, initial encounter for fracture: Secondary | ICD-10-CM | POA: Diagnosis not present

## 2020-01-12 DIAGNOSIS — K589 Irritable bowel syndrome without diarrhea: Secondary | ICD-10-CM | POA: Diagnosis not present

## 2020-01-12 DIAGNOSIS — Z0000 Encounter for general adult medical examination without abnormal findings: Secondary | ICD-10-CM | POA: Diagnosis not present

## 2020-01-12 DIAGNOSIS — K5900 Constipation, unspecified: Secondary | ICD-10-CM | POA: Diagnosis not present

## 2020-01-12 DIAGNOSIS — N390 Urinary tract infection, site not specified: Secondary | ICD-10-CM | POA: Diagnosis not present

## 2020-01-19 DIAGNOSIS — H903 Sensorineural hearing loss, bilateral: Secondary | ICD-10-CM | POA: Diagnosis not present

## 2020-01-23 DIAGNOSIS — E039 Hypothyroidism, unspecified: Secondary | ICD-10-CM | POA: Diagnosis not present

## 2020-01-23 DIAGNOSIS — K5732 Diverticulitis of large intestine without perforation or abscess without bleeding: Secondary | ICD-10-CM | POA: Diagnosis not present

## 2020-01-23 DIAGNOSIS — F339 Major depressive disorder, recurrent, unspecified: Secondary | ICD-10-CM | POA: Diagnosis not present

## 2020-01-23 DIAGNOSIS — Z87440 Personal history of urinary (tract) infections: Secondary | ICD-10-CM | POA: Diagnosis not present

## 2020-01-23 DIAGNOSIS — K5909 Other constipation: Secondary | ICD-10-CM | POA: Diagnosis not present

## 2020-01-23 DIAGNOSIS — K589 Irritable bowel syndrome without diarrhea: Secondary | ICD-10-CM | POA: Diagnosis not present

## 2020-01-23 DIAGNOSIS — E785 Hyperlipidemia, unspecified: Secondary | ICD-10-CM | POA: Diagnosis not present

## 2020-01-23 DIAGNOSIS — M1990 Unspecified osteoarthritis, unspecified site: Secondary | ICD-10-CM | POA: Diagnosis not present

## 2020-01-23 DIAGNOSIS — F419 Anxiety disorder, unspecified: Secondary | ICD-10-CM | POA: Diagnosis not present

## 2020-01-23 DIAGNOSIS — S32002D Unstable burst fracture of unspecified lumbar vertebra, subsequent encounter for fracture with routine healing: Secondary | ICD-10-CM | POA: Diagnosis not present

## 2020-01-23 DIAGNOSIS — E041 Nontoxic single thyroid nodule: Secondary | ICD-10-CM | POA: Diagnosis not present

## 2020-01-23 DIAGNOSIS — J309 Allergic rhinitis, unspecified: Secondary | ICD-10-CM | POA: Diagnosis not present

## 2020-01-23 DIAGNOSIS — E063 Autoimmune thyroiditis: Secondary | ICD-10-CM | POA: Diagnosis not present

## 2020-01-23 DIAGNOSIS — N200 Calculus of kidney: Secondary | ICD-10-CM | POA: Diagnosis not present

## 2020-01-23 DIAGNOSIS — N183 Chronic kidney disease, stage 3 unspecified: Secondary | ICD-10-CM | POA: Diagnosis not present

## 2020-01-23 DIAGNOSIS — K219 Gastro-esophageal reflux disease without esophagitis: Secondary | ICD-10-CM | POA: Diagnosis not present

## 2020-01-23 DIAGNOSIS — Z9181 History of falling: Secondary | ICD-10-CM | POA: Diagnosis not present

## 2020-01-23 DIAGNOSIS — I129 Hypertensive chronic kidney disease with stage 1 through stage 4 chronic kidney disease, or unspecified chronic kidney disease: Secondary | ICD-10-CM | POA: Diagnosis not present

## 2020-01-23 DIAGNOSIS — M810 Age-related osteoporosis without current pathological fracture: Secondary | ICD-10-CM | POA: Diagnosis not present

## 2020-01-23 DIAGNOSIS — M4850XD Collapsed vertebra, not elsewhere classified, site unspecified, subsequent encounter for fracture with routine healing: Secondary | ICD-10-CM | POA: Diagnosis not present

## 2020-01-23 DIAGNOSIS — G4700 Insomnia, unspecified: Secondary | ICD-10-CM | POA: Diagnosis not present

## 2020-02-02 DIAGNOSIS — I129 Hypertensive chronic kidney disease with stage 1 through stage 4 chronic kidney disease, or unspecified chronic kidney disease: Secondary | ICD-10-CM | POA: Diagnosis not present

## 2020-02-02 DIAGNOSIS — M1990 Unspecified osteoarthritis, unspecified site: Secondary | ICD-10-CM | POA: Diagnosis not present

## 2020-02-02 DIAGNOSIS — M810 Age-related osteoporosis without current pathological fracture: Secondary | ICD-10-CM | POA: Diagnosis not present

## 2020-02-02 DIAGNOSIS — E039 Hypothyroidism, unspecified: Secondary | ICD-10-CM | POA: Diagnosis not present

## 2020-02-02 DIAGNOSIS — E063 Autoimmune thyroiditis: Secondary | ICD-10-CM | POA: Diagnosis not present

## 2020-02-02 DIAGNOSIS — G4700 Insomnia, unspecified: Secondary | ICD-10-CM | POA: Diagnosis not present

## 2020-02-02 DIAGNOSIS — E041 Nontoxic single thyroid nodule: Secondary | ICD-10-CM | POA: Diagnosis not present

## 2020-02-02 DIAGNOSIS — N200 Calculus of kidney: Secondary | ICD-10-CM | POA: Diagnosis not present

## 2020-02-02 DIAGNOSIS — K5909 Other constipation: Secondary | ICD-10-CM | POA: Diagnosis not present

## 2020-02-02 DIAGNOSIS — M4850XD Collapsed vertebra, not elsewhere classified, site unspecified, subsequent encounter for fracture with routine healing: Secondary | ICD-10-CM | POA: Diagnosis not present

## 2020-02-02 DIAGNOSIS — Z87440 Personal history of urinary (tract) infections: Secondary | ICD-10-CM | POA: Diagnosis not present

## 2020-02-02 DIAGNOSIS — Z9181 History of falling: Secondary | ICD-10-CM | POA: Diagnosis not present

## 2020-02-02 DIAGNOSIS — F419 Anxiety disorder, unspecified: Secondary | ICD-10-CM | POA: Diagnosis not present

## 2020-02-02 DIAGNOSIS — K589 Irritable bowel syndrome without diarrhea: Secondary | ICD-10-CM | POA: Diagnosis not present

## 2020-02-02 DIAGNOSIS — E785 Hyperlipidemia, unspecified: Secondary | ICD-10-CM | POA: Diagnosis not present

## 2020-02-02 DIAGNOSIS — K5732 Diverticulitis of large intestine without perforation or abscess without bleeding: Secondary | ICD-10-CM | POA: Diagnosis not present

## 2020-02-02 DIAGNOSIS — N183 Chronic kidney disease, stage 3 unspecified: Secondary | ICD-10-CM | POA: Diagnosis not present

## 2020-02-02 DIAGNOSIS — S32002D Unstable burst fracture of unspecified lumbar vertebra, subsequent encounter for fracture with routine healing: Secondary | ICD-10-CM | POA: Diagnosis not present

## 2020-02-02 DIAGNOSIS — J309 Allergic rhinitis, unspecified: Secondary | ICD-10-CM | POA: Diagnosis not present

## 2020-02-02 DIAGNOSIS — K219 Gastro-esophageal reflux disease without esophagitis: Secondary | ICD-10-CM | POA: Diagnosis not present

## 2020-02-02 DIAGNOSIS — F339 Major depressive disorder, recurrent, unspecified: Secondary | ICD-10-CM | POA: Diagnosis not present

## 2020-02-19 ENCOUNTER — Emergency Department (HOSPITAL_COMMUNITY): Payer: PPO

## 2020-02-19 ENCOUNTER — Observation Stay (HOSPITAL_COMMUNITY)
Admission: EM | Admit: 2020-02-19 | Discharge: 2020-02-21 | Disposition: A | Discharge status: Home / Self Care | Payer: PPO | Attending: Surgery | Admitting: Surgery

## 2020-02-19 ENCOUNTER — Encounter (HOSPITAL_COMMUNITY): Payer: Self-pay

## 2020-02-19 ENCOUNTER — Other Ambulatory Visit: Payer: Self-pay

## 2020-02-19 DIAGNOSIS — W01198A Fall on same level from slipping, tripping and stumbling with subsequent striking against other object, initial encounter: Secondary | ICD-10-CM | POA: Diagnosis not present

## 2020-02-19 DIAGNOSIS — M542 Cervicalgia: Secondary | ICD-10-CM | POA: Diagnosis not present

## 2020-02-19 DIAGNOSIS — S2242XA Multiple fractures of ribs, left side, initial encounter for closed fracture: Principal | ICD-10-CM | POA: Insufficient documentation

## 2020-02-19 DIAGNOSIS — Z7409 Other reduced mobility: Secondary | ICD-10-CM | POA: Diagnosis not present

## 2020-02-19 DIAGNOSIS — Y999 Unspecified external cause status: Secondary | ICD-10-CM | POA: Insufficient documentation

## 2020-02-19 DIAGNOSIS — Y92009 Unspecified place in unspecified non-institutional (private) residence as the place of occurrence of the external cause: Secondary | ICD-10-CM | POA: Insufficient documentation

## 2020-02-19 DIAGNOSIS — S299XXA Unspecified injury of thorax, initial encounter: Secondary | ICD-10-CM | POA: Diagnosis not present

## 2020-02-19 DIAGNOSIS — S0990XA Unspecified injury of head, initial encounter: Secondary | ICD-10-CM | POA: Diagnosis not present

## 2020-02-19 DIAGNOSIS — R52 Pain, unspecified: Secondary | ICD-10-CM | POA: Diagnosis not present

## 2020-02-19 DIAGNOSIS — Y9389 Activity, other specified: Secondary | ICD-10-CM | POA: Insufficient documentation

## 2020-02-19 DIAGNOSIS — Z90710 Acquired absence of both cervix and uterus: Secondary | ICD-10-CM | POA: Insufficient documentation

## 2020-02-19 DIAGNOSIS — Z20822 Contact with and (suspected) exposure to covid-19: Secondary | ICD-10-CM | POA: Diagnosis not present

## 2020-02-19 DIAGNOSIS — S2249XA Multiple fractures of ribs, unspecified side, initial encounter for closed fracture: Secondary | ICD-10-CM | POA: Diagnosis present

## 2020-02-19 DIAGNOSIS — R2681 Unsteadiness on feet: Secondary | ICD-10-CM | POA: Diagnosis not present

## 2020-02-19 DIAGNOSIS — S3993XA Unspecified injury of pelvis, initial encounter: Secondary | ICD-10-CM | POA: Diagnosis not present

## 2020-02-19 DIAGNOSIS — J9811 Atelectasis: Secondary | ICD-10-CM | POA: Diagnosis not present

## 2020-02-19 DIAGNOSIS — S2232XA Fracture of one rib, left side, initial encounter for closed fracture: Secondary | ICD-10-CM

## 2020-02-19 DIAGNOSIS — S3992XA Unspecified injury of lower back, initial encounter: Secondary | ICD-10-CM | POA: Diagnosis not present

## 2020-02-19 DIAGNOSIS — S199XXA Unspecified injury of neck, initial encounter: Secondary | ICD-10-CM | POA: Diagnosis not present

## 2020-02-19 DIAGNOSIS — N179 Acute kidney failure, unspecified: Secondary | ICD-10-CM | POA: Diagnosis not present

## 2020-02-19 DIAGNOSIS — W19XXXA Unspecified fall, initial encounter: Secondary | ICD-10-CM | POA: Diagnosis not present

## 2020-02-19 DIAGNOSIS — K5901 Slow transit constipation: Secondary | ICD-10-CM | POA: Diagnosis not present

## 2020-02-19 DIAGNOSIS — R0789 Other chest pain: Secondary | ICD-10-CM | POA: Diagnosis not present

## 2020-02-19 DIAGNOSIS — Z79899 Other long term (current) drug therapy: Secondary | ICD-10-CM | POA: Diagnosis not present

## 2020-02-19 DIAGNOSIS — S2242XD Multiple fractures of ribs, left side, subsequent encounter for fracture with routine healing: Secondary | ICD-10-CM | POA: Diagnosis not present

## 2020-02-19 DIAGNOSIS — R0781 Pleurodynia: Secondary | ICD-10-CM | POA: Diagnosis not present

## 2020-02-19 LAB — URINALYSIS, ROUTINE W REFLEX MICROSCOPIC
Bilirubin Urine: NEGATIVE
Glucose, UA: NEGATIVE mg/dL
Ketones, ur: NEGATIVE mg/dL
Nitrite: NEGATIVE
Protein, ur: NEGATIVE mg/dL
Specific Gravity, Urine: 1.01 (ref 1.005–1.030)
pH: 6 (ref 5.0–8.0)

## 2020-02-19 LAB — CBC WITH DIFFERENTIAL/PLATELET
Abs Immature Granulocytes: 0.06 10*3/uL (ref 0.00–0.07)
Basophils Absolute: 0.1 10*3/uL (ref 0.0–0.1)
Basophils Relative: 1 %
Eosinophils Absolute: 0.3 10*3/uL (ref 0.0–0.5)
Eosinophils Relative: 2 %
HCT: 42.1 % (ref 36.0–46.0)
Hemoglobin: 13.2 g/dL (ref 12.0–15.0)
Immature Granulocytes: 1 %
Lymphocytes Relative: 13 %
Lymphs Abs: 1.6 10*3/uL (ref 0.7–4.0)
MCH: 31 pg (ref 26.0–34.0)
MCHC: 31.4 g/dL (ref 30.0–36.0)
MCV: 98.8 fL (ref 80.0–100.0)
Monocytes Absolute: 0.7 10*3/uL (ref 0.1–1.0)
Monocytes Relative: 6 %
Neutro Abs: 9.1 10*3/uL — ABNORMAL HIGH (ref 1.7–7.7)
Neutrophils Relative %: 77 %
Platelets: 205 10*3/uL (ref 150–400)
RBC: 4.26 MIL/uL (ref 3.87–5.11)
RDW: 13.6 % (ref 11.5–15.5)
WBC: 11.8 10*3/uL — ABNORMAL HIGH (ref 4.0–10.5)
nRBC: 0 % (ref 0.0–0.2)

## 2020-02-19 LAB — COMPREHENSIVE METABOLIC PANEL
ALT: 18 U/L (ref 0–44)
AST: 26 U/L (ref 15–41)
Albumin: 3.6 g/dL (ref 3.5–5.0)
Alkaline Phosphatase: 39 U/L (ref 38–126)
Anion gap: 9 (ref 5–15)
BUN: 13 mg/dL (ref 8–23)
CO2: 25 mmol/L (ref 22–32)
Calcium: 9.8 mg/dL (ref 8.9–10.3)
Chloride: 109 mmol/L (ref 98–111)
Creatinine, Ser: 1.05 mg/dL — ABNORMAL HIGH (ref 0.44–1.00)
GFR calc Af Amer: 55 mL/min — ABNORMAL LOW (ref >60)
GFR calc non Af Amer: 48 mL/min — ABNORMAL LOW (ref >60)
Glucose, Bld: 103 mg/dL — ABNORMAL HIGH (ref 70–99)
Potassium: 3.7 mmol/L (ref 3.5–5.1)
Sodium: 143 mmol/L (ref 135–145)
Total Bilirubin: 0.7 mg/dL (ref 0.3–1.2)
Total Protein: 7.3 g/dL (ref 6.5–8.1)

## 2020-02-19 LAB — SARS CORONAVIRUS 2 BY RT PCR (HOSPITAL ORDER, PERFORMED IN ~~LOC~~ HOSPITAL LAB): SARS Coronavirus 2: NEGATIVE

## 2020-02-19 LAB — TSH: TSH: 1.881 u[IU]/mL (ref 0.350–4.500)

## 2020-02-19 LAB — TROPONIN I (HIGH SENSITIVITY)
Troponin I (High Sensitivity): 8 ng/L (ref <18)
Troponin I (High Sensitivity): 9 ng/L (ref <18)

## 2020-02-19 MED ORDER — ONDANSETRON HCL 4 MG/2ML IJ SOLN: 4.0000 mg | Freq: Four times a day (QID) | INTRAMUSCULAR | Status: DC | PRN

## 2020-02-19 MED ORDER — MORPHINE SULFATE (PF) 2 MG/ML IV SOLN
2.0000 mg | INTRAVENOUS | Status: DC | PRN
  Administered 2020-02-19: 2 mg via INTRAVENOUS
  Filled 2020-02-19: qty 1

## 2020-02-19 MED ORDER — DOCUSATE SODIUM 100 MG PO CAPS
100.0000 mg | ORAL_CAPSULE | Freq: Two times a day (BID) | ORAL | Status: DC
  Administered 2020-02-19 – 2020-02-21 (×5): 100 mg via ORAL
  Filled 2020-02-19 (×5): qty 1

## 2020-02-19 MED ORDER — ONDANSETRON HCL 4 MG/2ML IJ SOLN
4.0000 mg | Freq: Once | INTRAMUSCULAR | Status: AC
  Administered 2020-02-19: 4 mg via INTRAVENOUS
  Filled 2020-02-19: qty 2

## 2020-02-19 MED ORDER — LIDOCAINE 5 % EX PTCH
1.0000 | MEDICATED_PATCH | CUTANEOUS | Status: DC
  Administered 2020-02-19 – 2020-02-20 (×2): 1 via TRANSDERMAL
  Filled 2020-02-19 (×2): qty 1

## 2020-02-19 MED ORDER — CHOLECALCIFEROL 10 MCG (400 UNIT) PO TABS
400.0000 [IU] | ORAL_TABLET | Freq: Every day | ORAL | Status: DC
  Administered 2020-02-19 – 2020-02-21 (×3): 400 [IU] via ORAL
  Filled 2020-02-19 (×4): qty 1

## 2020-02-19 MED ORDER — ALPRAZOLAM 0.5 MG PO TABS
0.5000 mg | ORAL_TABLET | Freq: Every day | ORAL | Status: DC
  Administered 2020-02-19 – 2020-02-20 (×2): 0.5 mg via ORAL
  Filled 2020-02-19 (×2): qty 1

## 2020-02-19 MED ORDER — ENOXAPARIN SODIUM 30 MG/0.3ML ~~LOC~~ SOLN
30.0000 mg | SUBCUTANEOUS | Status: DC
  Administered 2020-02-19 – 2020-02-20 (×2): 30 mg via SUBCUTANEOUS
  Filled 2020-02-19 (×2): qty 0.3

## 2020-02-19 MED ORDER — ALPRAZOLAM 0.25 MG PO TABS
0.2500 mg | ORAL_TABLET | Freq: Every day | ORAL | Status: DC
  Administered 2020-02-20 – 2020-02-21 (×2): 0.25 mg via ORAL
  Filled 2020-02-19 (×2): qty 1

## 2020-02-19 MED ORDER — SODIUM CHLORIDE 0.9 % IV BOLUS
500.0000 mL | Freq: Once | INTRAVENOUS | Status: AC
  Administered 2020-02-19: 500 mL via INTRAVENOUS

## 2020-02-19 MED ORDER — BUPROPION HCL ER (SR) 150 MG PO TB12
150.0000 mg | ORAL_TABLET | Freq: Two times a day (BID) | ORAL | Status: DC
  Administered 2020-02-19 – 2020-02-21 (×5): 150 mg via ORAL
  Filled 2020-02-19 (×5): qty 1

## 2020-02-19 MED ORDER — ONDANSETRON 4 MG PO TBDP: 4.0000 mg | ORAL_TABLET | Freq: Four times a day (QID) | ORAL | Status: DC | PRN

## 2020-02-19 MED ORDER — MORPHINE SULFATE (PF) 2 MG/ML IV SOLN
2.0000 mg | Freq: Once | INTRAVENOUS | Status: AC
  Administered 2020-02-19: 2 mg via INTRAVENOUS
  Filled 2020-02-19: qty 1

## 2020-02-19 MED ORDER — MORPHINE SULFATE (PF) 4 MG/ML IV SOLN
4.0000 mg | Freq: Once | INTRAVENOUS | Status: AC
  Administered 2020-02-19: 4 mg via INTRAVENOUS
  Filled 2020-02-19: qty 1

## 2020-02-19 MED ORDER — METHOCARBAMOL 500 MG PO TABS
500.0000 mg | ORAL_TABLET | Freq: Three times a day (TID) | ORAL | Status: DC | PRN
  Administered 2020-02-19 – 2020-02-21 (×2): 500 mg via ORAL
  Filled 2020-02-19 (×2): qty 1

## 2020-02-19 MED ORDER — AZELASTINE HCL 0.1 % NA SOLN
2.0000 | Freq: Every day | NASAL | Status: DC
  Administered 2020-02-20 – 2020-02-21 (×2): 2 via NASAL
  Filled 2020-02-19: qty 30

## 2020-02-19 MED ORDER — MIRTAZAPINE 30 MG PO TBDP
30.0000 mg | ORAL_TABLET | Freq: Every day | ORAL | Status: DC
  Administered 2020-02-20: 30 mg via ORAL
  Filled 2020-02-19 (×3): qty 1

## 2020-02-19 MED ORDER — AMLODIPINE BESYLATE 5 MG PO TABS: 2.5000 mg | ORAL_TABLET | Freq: Every day | ORAL | Status: DC

## 2020-02-19 MED ORDER — TRAMADOL HCL 50 MG PO TABS
50.0000 mg | ORAL_TABLET | Freq: Four times a day (QID) | ORAL | Status: DC | PRN
  Administered 2020-02-20 – 2020-02-21 (×2): 50 mg via ORAL
  Filled 2020-02-19 (×2): qty 1

## 2020-02-19 MED ORDER — LEVOTHYROXINE SODIUM 88 MCG PO TABS
88.0000 ug | ORAL_TABLET | Freq: Every morning | ORAL | Status: DC
  Administered 2020-02-20 – 2020-02-21 (×2): 88 ug via ORAL
  Filled 2020-02-19 (×2): qty 1

## 2020-02-19 MED ORDER — AMLODIPINE BESYLATE 2.5 MG PO TABS
2.5000 mg | ORAL_TABLET | Freq: Every day | ORAL | Status: DC
  Administered 2020-02-21: 2.5 mg via ORAL
  Filled 2020-02-19 (×2): qty 1

## 2020-02-19 MED ORDER — HYDRALAZINE HCL 10 MG PO TABS: 10.0000 mg | ORAL_TABLET | Freq: Three times a day (TID) | ORAL | Status: DC | PRN

## 2020-02-19 NOTE — Progress Notes (Signed)
Admitted to 6n29 from ED. A/Ox4, moved self from stretcher to bed without difficulty- placed in low bed with mats in place.

## 2020-02-19 NOTE — ED Notes (Signed)
Pt ambulated in the hall. Pt required some assistance as she was a little unsteady. Pt reported some pain in her back, but claimed it felt better than it had previously. Overall pt tolerated the activity well. Provider notified of findings.

## 2020-02-19 NOTE — H&P (Signed)
[facility] Surgery Admission Note  Lisa Murray 02-11-1931  [id number].     Requesting MD: Particia Nearing, MD Chief Complaint/Reason for Consult: Fall, multiple left rib fractures HPI:  Ms. Lisa Murray is a [name] with a PMH osteoporosis, hypothyroidism, and dizziness who presented to the hospital after a ground level fall at home this morning. Patient states she was walking from her room to the living room this morning around 0700 when she fell and hit her left side on a metal magazine rack. She denies LOC. Was unable to get up from the ground independently. Luckily was holding her portable house phone so was able to call a family member for help. Her cc in left posterior rib pain, worse with inspiration/movement. She denies the use of blood thinners. Reports having a "wobbly" gait lately, and has had 3 falls at home in the last 6 weeks. For this reason, she says her PCP (Dr. Dan Humphreys) has recently ordered her home health PT and was in the process of ordering her an assistive device for walking. She recently had her drivers licence renewed and still drives herself to all of her doctors appointments. She lives at home by herself. She states she has received her COVID vaccines (mederna).   ROS: Review of Systems  Constitutional: Negative.   HENT: Positive for hearing loss (chronic, HOH, uses hearing aids) and sinus pain. Negative for nosebleeds, sore throat and tinnitus.   Eyes: Negative.   Respiratory: Negative.   Cardiovascular: Positive for chest pain. Negative for palpitations, orthopnea, claudication and leg swelling.  Gastrointestinal: Negative.   Genitourinary: Negative.   Musculoskeletal: Negative for neck pain.  Skin: Negative.   Neurological: Negative.   Endo/Heme/Allergies: Negative.   Psychiatric/Behavioral: Negative.     No family history on file.  History reviewed. No pertinent past medical history.  Past Surgical History:  Procedure Laterality Date  . ABDOMINAL  HYSTERECTOMY    . APPENDECTOMY    . BLADDER REPAIR      Social History:  reports that she has never smoked. She has never used smokeless tobacco. She reports that she does not drink alcohol. No history on file for drug use.  Allergies: No Known Allergies  (Not in a hospital admission)   Blood pressure 130/66, pulse 77, temperature 98 F (36.7 C), temperature source Oral, resp. rate (!) 24, height 5\' 2"  (1.575 m), weight 50.8 kg, SpO2 99 %. Physical Exam: Constitutional: NAD; conversant; no deformities Eyes: Moist conjunctiva; no lid lag; anicteric; PERRL Neck: Trachea midline; no thyromegaly Lungs: appropriately tender left posterior chest wall with overlying ecchymosis, no palpable subcutaneous emphysema, Normal respiratory effort; no tactile fremitus, CTAB CV: RRR; no palpable thrills; no pitting edema GI: Abd soft and non-tender; no palpable hepatosplenomegaly MSK: extremities are symmetrical, no clubbing/cyanosis, grip strength 5/5 bilaterally Psychiatric: Appropriate affect; alert and oriented x3 Lymphatic: No palpable cervical or axillary lymphadenopathy  Results for orders placed or performed during the hospital encounter of 02/19/20 (from the past 48 hour(s))  Comprehensive metabolic panel     Status: Abnormal   Collection Time: 02/19/20  9:04 AM  Result Value Ref Range   Sodium 143 135 - 145 mmol/L   Potassium 3.7 3.5 - 5.1 mmol/L   Chloride 109 98 - 111 mmol/L   CO2 25 22 - 32 mmol/L   Glucose, Bld 103 (H) 70 - 99 mg/dL    Comment: Glucose reference range applies only to samples taken after fasting for at least 8 hours.   BUN  13 8 - 23 mg/dL   Creatinine, Ser 0.93 (H) 0.44 - 1.00 mg/dL   Calcium 9.8 8.9 - 23.5 mg/dL   Total Protein 7.3 6.5 - 8.1 g/dL   Albumin 3.6 3.5 - 5.0 g/dL   AST 26 15 - 41 U/L   ALT 18 0 - 44 U/L   Alkaline Phosphatase 39 38 - 126 U/L   Total Bilirubin 0.7 0.3 - 1.2 mg/dL   GFR calc non Af Amer 48 (L) >60 mL/min   GFR calc Af Amer 55 (L)  >60 mL/min   Anion gap 9 5 - 15    Comment: Performed at [facility], [address] [place], [place] [address]  CBC with Differential     Status: Abnormal   Collection Time: 02/19/20  9:04 AM  Result Value Ref Range   WBC 11.8 (H) 4.0 - 10.5 K/uL   RBC 4.26 3.87 - 5.11 MIL/uL   Hemoglobin 13.2 12.0 - 15.0 g/dL   HCT 02.5 36 - 46 %   MCV 98.8 80.0 - 100.0 fL   MCH 31.0 26.0 - 34.0 pg   MCHC 31.4 30.0 - 36.0 g/dL   RDW 42.7 06.2 - 37.6 %   Platelets 205 150 - 400 K/uL   nRBC 0.0 0.0 - 0.2 %   Neutrophils Relative % 77 %   Neutro Abs 9.1 (H) 1.7 - 7.7 K/uL   Lymphocytes Relative 13 %   Lymphs Abs 1.6 0.7 - 4.0 K/uL   Monocytes Relative 6 %   Monocytes Absolute 0.7 0 - 1 K/uL   Eosinophils Relative 2 %   Eosinophils Absolute 0.3 0 - 0 K/uL   Basophils Relative 1 %   Basophils Absolute 0.1 0 - 0 K/uL   Immature Granulocytes 1 %   Abs Immature Granulocytes 0.06 0.00 - 0.07 K/uL    Comment: Performed at [facility], [address], [place], [place] [address]  Troponin I (High Sensitivity)     Status: None   Collection Time: 02/19/20  9:04 AM  Result Value Ref Range   Troponin I (High Sensitivity) 8 <18 ng/L    Comment: (NOTE) Elevated high sensitivity troponin I (hsTnI) values and significant  changes across serial measurements may suggest ACS but many other  chronic and acute conditions are known to elevate hsTnI results.  Refer to the "Links" section for chest pain algorithms and additional  guidance. Performed at [facility], [address], [place], [place] [address]   Urinalysis, Routine w reflex microscopic Urine, Clean Catch     Status: Abnormal   Collection Time: 02/19/20  9:04 AM  Result Value Ref Range   Color, Urine YELLOW YELLOW   APPearance CLEAR CLEAR   Specific Gravity, Urine 1.010 1.005 - 1.030   pH 6.0 5.0 - 8.0   Glucose, UA NEGATIVE NEGATIVE mg/dL   Hgb urine dipstick SMALL (A) NEGATIVE   Bilirubin Urine NEGATIVE NEGATIVE    Ketones, ur NEGATIVE NEGATIVE mg/dL   Protein, ur NEGATIVE NEGATIVE mg/dL   Nitrite NEGATIVE NEGATIVE   Leukocytes,Ua MODERATE (A) NEGATIVE   RBC / HPF 0-5 0 - 5 RBC/hpf   WBC, UA 11-20 0 - 5 WBC/hpf   Bacteria, UA RARE (A) NONE SEEN   Squamous Epithelial / LPF 0-5 0 - 5   Hyaline Casts, UA PRESENT     Comment: Performed at [facility], [address], [place], [place] [address]  TSH  Status: None   Collection Time: 02/19/20  9:06 AM  Result Value Ref Range   TSH 1.881 0.350 - 4.500 uIU/mL    Comment: Performed by a 3rd Generation assay with a functional sensitivity of <=0.01 uIU/mL. Performed at [facility], [address] [place], [place] [address]   Troponin I (High Sensitivity)     Status: None   Collection Time: 02/19/20 11:04 AM  Result Value Ref Range   Troponin I (High Sensitivity) 9 <18 ng/L    Comment: (NOTE) Elevated high sensitivity troponin I (hsTnI) values and significant  changes across serial measurements may suggest ACS but many other  chronic and acute conditions are known to elevate hsTnI results.  Refer to the "Links" section for chest pain algorithms and additional  guidance. Performed at [facility], [address] [place], [place] [address]    DG Chest 2 View  Result Date: 02/19/2020 CLINICAL DATA:  Fall. EXAM: CHEST - 2 VIEW COMPARISON:  January 20, 2018. FINDINGS: The heart size and mediastinal contours are within normal limits. Both lungs are clear. Stable large hiatal or left diaphragmatic hernia is noted. No pneumothorax or pleural effusion is noted. The visualized skeletal structures are unremarkable. IMPRESSION: No active cardiopulmonary disease. Stable large hiatal or diaphragmatic cardia. Electronically Signed   By: Lupita RaiderJames  Green Jr M.D.   On: 02/19/2020 09:40   DG Lumbar Spine Complete  Result Date: 02/19/2020 CLINICAL DATA:  Fall. EXAM: LUMBAR SPINE - COMPLETE 4+ VIEW COMPARISON:  January 20, 2018. FINDINGS: Severe compression  deformity of L1 vertebral body is noted with moderate compression deformity of L2 vertebral body, consistent with old fractures. Mild superior endplate depression of L4 vertebral body is noted most consistent with old fracture. No definite acute fracture or spondylolisthesis is noted. Moderate levoscoliosis of lumbar spine is noted. IMPRESSION: Probable old fractures involving L1 and L2 vertebral bodies. Mild superior endplate depression of L4 vertebral body is noted most consistent with old fracture. No definite acute fracture is noted. Electronically Signed   By: Lupita RaiderJames  Green Jr M.D.   On: 02/19/2020 09:42   DG Pelvis 1-2 Views  Result Date: 02/19/2020 CLINICAL DATA:  Fall. EXAM: PELVIS - 1-2 VIEW COMPARISON:  Nov 12, 2019. FINDINGS: There is no evidence of pelvic fracture or diastasis. No pelvic bone lesions are seen. IMPRESSION: Negative. Electronically Signed   By: Lupita RaiderJames  Green Jr M.D.   On: 02/19/2020 09:43   CT Head Wo Contrast  Result Date: 02/19/2020 CLINICAL DATA:  Head trauma. EXAM: CT HEAD WITHOUT CONTRAST TECHNIQUE: Contiguous axial images were obtained from the base of the skull through the vertex without intravenous contrast. COMPARISON:  None. FINDINGS: Brain: No evidence of acute large vascular territory infarction, hemorrhage, hydrocephalus, extra-axial collection or mass lesion/mass effect. Small hypodensity in the right thalamus. Mild to moderate diffuse cerebral volume loss with ex vacuo ventricular dilation. Patchy white matter hypoattenuation is nonspecific but likely the sequela of chronic microvascular ischemic disease. Vascular: Calcific intracranial atherosclerosis. No hyperdense vessel identified. Skull: Normal. Negative for fracture or focal lesion.89 Sinuses/Orbits: Mild scattered paranasal sinus mucosal thickening. Other: No mastoid effusions. See CT chest for characterization a findings seen on the SCOUT. IMPRESSION: 1. No acute traumatic intracranial abnormality. 2. Small  hypodensity in the right thalamus is consistent with an age-indeterminate lacunar infarct, favored remote. MRI could further evaluate if there is concern for acute infarct. Electronically Signed   By: Feliberto HartsFrederick S Jones MD   On: 02/19/2020 10:49  CT Chest Wo Contrast  Result Date: 02/19/2020 CLINICAL DATA:  Minor chest trauma.  Left lower back pain. EXAM: CT CHEST WITHOUT CONTRAST TECHNIQUE: Multidetector CT imaging of the chest was performed following the standard protocol without IV contrast. COMPARISON:  None. FINDINGS: Cardiovascular: Normal heart size. No pericardial effusion. Aortic and coronary atherosclerosis. Mediastinum/Nodes: Large hiatal hernia containing pancreas body/tail, stomach, and colon. No mediastinal adenopathy. Thyroidectomy. Lungs/Pleura: Mild scarring over the diaphragmatic hernia. There is no edema, consolidation, effusion, or pneumothorax. Upper Abdomen: Large hernia as noted above. No acute finding in the abdomen. Small low-density in the right liver, likely cystic. Musculoskeletal: Remote L1 and L2 compression fractures. Posterior left tenth through twelfth rib fractures with only mild displacement. Severe glenohumeral osteoarthritis on the right IMPRESSION: 1. Posterior left tenth through twelfth rib fractures with up to mild displacement. No hemothorax or pneumothorax. 2. Remote L1 and L2 compression fractures. 3. No evidence of intrathoracic injury. 4. Large diaphragmatic hernia containing colon, pancreas, and stomach. Electronically Signed   By: Marnee Spring M.D.   On: 02/19/2020 10:47   CT Cervical Spine Wo Contrast  Result Date: 02/19/2020 CLINICAL DATA:  Neck trauma.  Fell at home. EXAM: CT CERVICAL SPINE WITHOUT CONTRAST TECHNIQUE: Multidetector CT imaging of the cervical spine was performed without intravenous contrast. Multiplanar CT image reconstructions were also generated. COMPARISON:  None. FINDINGS: Alignment: Normal. Skull base and vertebrae: No acute fracture. No  primary bone lesion or focal pathologic process. Diffuse osteopenia. Soft tissues and spinal canal: No prevertebral fluid or swelling. No visible large canal hematoma. Degenerative changes: Craniocervical degenerative change. Multilevel mild-to-moderate degenerative disc disease, greatest at C5-C6. Posterior disc bulge at C4-C5 which effaces the ventral CSF and contacts the ventral cord without evidence high-grade stenosis. Upper chest: Negative. Other: Atherosclerotic vascular calcifications. IMPRESSION: 1. No evidence of acute fracture or malalignment. 2. Degenerative changes, including C4-C5 posterior disc bulge which effaces the ventral CSF without evidence of advanced canal stenosis. Electronically Signed   By: Feliberto Harts MD   On: 02/19/2020 10:56    Assessment/Plan Ground level fall Left Rib FX 10-12 - multimodal pain control, IS 10x q 1h, CXR in AM  AKI - slight elevated of creatinine 1.05, give IVF bolus, and encourage PO fluids, re-check BMP in AM Hypothyroidism - home synthroid HTN - home Norvasc re-ordered Home meds re-ordered   FEN: regular ID: none VTE: SCD's, Lovenox Dispo: admit to med-surg observation, PT/OT evals, multimodal pain control   Adam Phenix, PA-C [facility] Please see Amion for pager number during day hours 7:00am-4:30pm 02/19/2020, 2:51 PM

## 2020-02-19 NOTE — ED Notes (Signed)
Patient transported to CT 

## 2020-02-19 NOTE — ED Notes (Signed)
Patient transported to X-ray 

## 2020-02-19 NOTE — ED Notes (Signed)
Will   [phone]  [phone] call with an update

## 2020-02-19 NOTE — ED Notes (Signed)
Trey Paula nephew [phone] would like an update

## 2020-02-19 NOTE — ED Notes (Signed)
Attempted to call report

## 2020-02-19 NOTE — ED Triage Notes (Signed)
PT BIB [facility] from home, pt lost her balance and fell, hit her Left flank on her magazine box, denies hitting her head or LOC, called family because she was unable to get herself up, force entry into home. Pt c/o Left flank pain and numbness to BLE > Left side. Pt HR irregular, no known hx of a-fib or irregular HR  C-collar placed en route   PT x1 month for balance concerns  A/o x4  BP 150/80 HR 70 96% RA  RR 20

## 2020-02-19 NOTE — ED Provider Notes (Signed)
[facility] Provider Note   CSN: [id number] Arrival date & time: 02/19/20  0850     History Chief Complaint  Patient presents with  . Fall  . Atrial Fibrillation    Lisa Murray is a 86 y.o. female.  Pt presents to the ED today with a fall and left flank pain.  Pt has had balance problems and has had home PT for about a month to help with that. She lost her balance and fell hitting her left ribs.  She was unable to get up.  She was able to call her family.  They called EMS who had to do a force entry into her home.  Pt does have an irregular HR, but no hx of this.  No loc.  No neck pain.        History reviewed. No pertinent past medical history.  There are no problems to display for this patient.   Past Surgical History:  Procedure Laterality Date  . ABDOMINAL HYSTERECTOMY    . APPENDECTOMY    . BLADDER REPAIR       OB History   No obstetric history on file.     No family history on file.  Social History   Tobacco Use  . Smoking status: Never Smoker  . Smokeless tobacco: Never Used  Substance Use Topics  . Alcohol use: Never  . Drug use: Not on file    Home Medications Prior to Admission medications   Medication Sig Start Date End Date Taking? Authorizing Provider  meclizine (ANTIVERT) 12.5 MG tablet Take 1-2 tablets (12.5-25 mg total) by mouth 3 (three) times daily as needed for dizziness. 01/20/18   Raeford RazorKohut, Stephen, MD    Allergies    Patient has no allergy information on record.  Review of Systems   Review of Systems  Musculoskeletal:       Chest wall pain  All other systems reviewed and are negative.   Physical Exam Updated Vital Signs BP (!) 156/92 (BP Location: Right Arm)   Pulse 79   Temp 98 F (36.7 C) (Oral)   Resp (!) 24   Ht 5\' 2"  (1.575 m)   Wt 50.8 kg   SpO2 99%   BMI 20.49 kg/m   Physical Exam Vitals and nursing note reviewed.  Constitutional:      Appearance: Normal appearance.    HENT:     Head: Normocephalic and atraumatic.     Right Ear: External ear normal.     Left Ear: External ear normal.     Nose: Nose normal.     Mouth/Throat:     Mouth: Mucous membranes are moist.     Pharynx: Oropharynx is clear.  Eyes:     Extraocular Movements: Extraocular movements intact.     Conjunctiva/sclera: Conjunctivae normal.     Pupils: Pupils are equal, round, and reactive to light.  Cardiovascular:     Rate and Rhythm: Normal rate. Rhythm irregular.     Pulses: Normal pulses.     Heart sounds: Normal heart sounds.  Pulmonary:     Effort: Pulmonary effort is normal.     Breath sounds: Normal breath sounds.  Abdominal:     General: Abdomen is flat. Bowel sounds are normal.     Palpations: Abdomen is soft.  Musculoskeletal:        General: Normal range of motion.     Cervical back: Normal range of motion and neck supple.  Back:  Skin:    General: Skin is warm.     Capillary Refill: Capillary refill takes less than 2 seconds.  Neurological:     General: No focal deficit present.     Mental Status: She is alert and oriented to person, place, and time.  Psychiatric:        Mood and Affect: Mood normal.        Behavior: Behavior normal.     ED Results / Procedures / Treatments   Labs (all labs ordered are listed, but only abnormal results are displayed) Labs Reviewed  COMPREHENSIVE METABOLIC PANEL - Abnormal; Notable for the following components:      Result Value   Glucose, Bld 103 (*)    Creatinine, Ser 1.05 (*)    GFR calc non Af Amer 48 (*)    GFR calc Af Amer 55 (*)    All other components within normal limits  CBC WITH DIFFERENTIAL/PLATELET - Abnormal; Notable for the following components:   WBC 11.8 (*)    Neutro Abs 9.1 (*)    All other components within normal limits  URINALYSIS, ROUTINE W REFLEX MICROSCOPIC - Abnormal; Notable for the following components:   Hgb urine dipstick SMALL (*)    Leukocytes,Ua MODERATE (*)    Bacteria, UA  RARE (*)    All other components within normal limits  SARS CORONAVIRUS 2 BY RT PCR (HOSPITAL ORDER, PERFORMED IN [facility])  TSH  TROPONIN I (HIGH SENSITIVITY)  TROPONIN I (HIGH SENSITIVITY)    EKG EKG Interpretation  Date/Time:  Thursday February 19 2020 09:02:56 EDT Ventricular Rate:  75 PR Interval:    QRS Duration: 110 QT Interval:  378 QTC Calculation: 423 R Axis:   49 Text Interpretation: Sinus rhythm Multiple premature complexes, vent & supraven RSR' in V1 or V2, right VCD or RVH Borderline T abnormalities, diffuse leads No significant change since last tracing Confirmed by Jacalyn Lefevre [id number]) on 02/19/2020 11:32:43 AM   Radiology DG Chest 2 View  Result Date: 02/19/2020 CLINICAL DATA:  Fall. EXAM: CHEST - 2 VIEW COMPARISON:  January 20, 2018. FINDINGS: The heart size and mediastinal contours are within normal limits. Both lungs are clear. Stable large hiatal or left diaphragmatic hernia is noted. No pneumothorax or pleural effusion is noted. The visualized skeletal structures are unremarkable. IMPRESSION: No active cardiopulmonary disease. Stable large hiatal or diaphragmatic cardia. Electronically Signed   By: Lupita Raider M.D.   On: 02/19/2020 09:40   DG Lumbar Spine Complete  Result Date: 02/19/2020 CLINICAL DATA:  Fall. EXAM: LUMBAR SPINE - COMPLETE 4+ VIEW COMPARISON:  January 20, 2018. FINDINGS: Severe compression deformity of L1 vertebral body is noted with moderate compression deformity of L2 vertebral body, consistent with old fractures. Mild superior endplate depression of L4 vertebral body is noted most consistent with old fracture. No definite acute fracture or spondylolisthesis is noted. Moderate levoscoliosis of lumbar spine is noted. IMPRESSION: Probable old fractures involving L1 and L2 vertebral bodies. Mild superior endplate depression of L4 vertebral body is noted most consistent with old fracture. No definite acute fracture is noted.  Electronically Signed   By: Lupita Raider M.D.   On: 02/19/2020 09:42   DG Pelvis 1-2 Views  Result Date: 02/19/2020 CLINICAL DATA:  Fall. EXAM: PELVIS - 1-2 VIEW COMPARISON:  Nov 12, 2019. FINDINGS: There is no evidence of pelvic fracture or diastasis. No pelvic bone lesions are seen. IMPRESSION: Negative. Electronically Signed   By: Fayrene Fearing  Christen Butter M.D.   On: 02/19/2020 09:43   CT Head Wo Contrast  Result Date: 02/19/2020 CLINICAL DATA:  Head trauma. EXAM: CT HEAD WITHOUT CONTRAST TECHNIQUE: Contiguous axial images were obtained from the base of the skull through the vertex without intravenous contrast. COMPARISON:  None. FINDINGS: Brain: No evidence of acute large vascular territory infarction, hemorrhage, hydrocephalus, extra-axial collection or mass lesion/mass effect. Small hypodensity in the right thalamus. Mild to moderate diffuse cerebral volume loss with ex vacuo ventricular dilation. Patchy white matter hypoattenuation is nonspecific but likely the sequela of chronic microvascular ischemic disease. Vascular: Calcific intracranial atherosclerosis. No hyperdense vessel identified. Skull: Normal. Negative for fracture or focal lesion.89 Sinuses/Orbits: Mild scattered paranasal sinus mucosal thickening. Other: No mastoid effusions. See CT chest for characterization a findings seen on the SCOUT. IMPRESSION: 1. No acute traumatic intracranial abnormality. 2. Small hypodensity in the right thalamus is consistent with an age-indeterminate lacunar infarct, favored remote. MRI could further evaluate if there is concern for acute infarct. Electronically Signed   By: Feliberto Harts MD   On: 02/19/2020 10:49   CT Chest Wo Contrast  Result Date: 02/19/2020 CLINICAL DATA:  Minor chest trauma.  Left lower back pain. EXAM: CT CHEST WITHOUT CONTRAST TECHNIQUE: Multidetector CT imaging of the chest was performed following the standard protocol without IV contrast. COMPARISON:  None. FINDINGS: Cardiovascular:  Normal heart size. No pericardial effusion. Aortic and coronary atherosclerosis. Mediastinum/Nodes: Large hiatal hernia containing pancreas body/tail, stomach, and colon. No mediastinal adenopathy. Thyroidectomy. Lungs/Pleura: Mild scarring over the diaphragmatic hernia. There is no edema, consolidation, effusion, or pneumothorax. Upper Abdomen: Large hernia as noted above. No acute finding in the abdomen. Small low-density in the right liver, likely cystic. Musculoskeletal: Remote L1 and L2 compression fractures. Posterior left tenth through twelfth rib fractures with only mild displacement. Severe glenohumeral osteoarthritis on the right IMPRESSION: 1. Posterior left tenth through twelfth rib fractures with up to mild displacement. No hemothorax or pneumothorax. 2. Remote L1 and L2 compression fractures. 3. No evidence of intrathoracic injury. 4. Large diaphragmatic hernia containing colon, pancreas, and stomach. Electronically Signed   By: Marnee Spring M.D.   On: 02/19/2020 10:47   CT Cervical Spine Wo Contrast  Result Date: 02/19/2020 CLINICAL DATA:  Neck trauma.  Fell at home. EXAM: CT CERVICAL SPINE WITHOUT CONTRAST TECHNIQUE: Multidetector CT imaging of the cervical spine was performed without intravenous contrast. Multiplanar CT image reconstructions were also generated. COMPARISON:  None. FINDINGS: Alignment: Normal. Skull base and vertebrae: No acute fracture. No primary bone lesion or focal pathologic process. Diffuse osteopenia. Soft tissues and spinal canal: No prevertebral fluid or swelling. No visible large canal hematoma. Degenerative changes: Craniocervical degenerative change. Multilevel mild-to-moderate degenerative disc disease, greatest at C5-C6. Posterior disc bulge at C4-C5 which effaces the ventral CSF and contacts the ventral cord without evidence high-grade stenosis. Upper chest: Negative. Other: Atherosclerotic vascular calcifications. IMPRESSION: 1. No evidence of acute fracture or  malalignment. 2. Degenerative changes, including C4-C5 posterior disc bulge which effaces the ventral CSF without evidence of advanced canal stenosis. Electronically Signed   By: Feliberto Harts MD   On: 02/19/2020 10:56    Procedures Procedures (including critical care time)  Medications Ordered in ED Medications  morphine 4 MG/ML injection 4 mg (has no administration in time range)  sodium chloride 0.9 % bolus 500 mL (500 mLs Intravenous New Bag/Given 02/19/20 1041)  morphine 2 MG/ML injection 2 mg (2 mg Intravenous Given 02/19/20 1203)  ondansetron (ZOFRAN) injection 4 mg (  4 mg Intravenous Given 02/19/20 1203)    ED Course  I have reviewed the triage vital signs and the nursing notes.  Pertinent labs & imaging results that were available during my care of the patient were reviewed by me and considered in my medical decision making (see chart for details).    MDM Rules/Calculators/A&P                          Pt is aware of the multiple compression fractures.  C-collar removed due to nothing acute on ct and no pain.  She is not in afib.  She has an irregular HR with PVCs and PACs.  This EKG looks unchanged from prior.  Pt is able to walk with assistance, but pain is severe.  Pt d/w trauma who will see pt.  Trauma saw her and admitted her for pain control.  Final Clinical Impression(s) / ED Diagnoses Final diagnoses:  Closed fracture of multiple ribs of left side, initial encounter    Rx / DC Orders ED Discharge Orders    None       Jacalyn Lefevre, MD 02/24/20 0700

## 2020-02-20 ENCOUNTER — Observation Stay (HOSPITAL_COMMUNITY): Payer: PPO

## 2020-02-20 DIAGNOSIS — R0781 Pleurodynia: Secondary | ICD-10-CM | POA: Diagnosis not present

## 2020-02-20 DIAGNOSIS — M6281 Muscle weakness (generalized): Secondary | ICD-10-CM | POA: Diagnosis not present

## 2020-02-20 DIAGNOSIS — S2242XA Multiple fractures of ribs, left side, initial encounter for closed fracture: Secondary | ICD-10-CM | POA: Diagnosis not present

## 2020-02-20 DIAGNOSIS — N179 Acute kidney failure, unspecified: Secondary | ICD-10-CM | POA: Diagnosis not present

## 2020-02-20 LAB — BASIC METABOLIC PANEL
Anion gap: 8 (ref 5–15)
BUN: 10 mg/dL (ref 8–23)
CO2: 25 mmol/L (ref 22–32)
Calcium: 9.1 mg/dL (ref 8.9–10.3)
Chloride: 109 mmol/L (ref 98–111)
Creatinine, Ser: 0.87 mg/dL (ref 0.44–1.00)
GFR calc Af Amer: >60 mL/min (ref >60)
GFR calc non Af Amer: 60 mL/min — ABNORMAL LOW (ref >60)
Glucose, Bld: 104 mg/dL — ABNORMAL HIGH (ref 70–99)
Potassium: 3.9 mmol/L (ref 3.5–5.1)
Sodium: 142 mmol/L (ref 135–145)

## 2020-02-20 LAB — CBC
HCT: 37.7 % (ref 36.0–46.0)
Hemoglobin: 12.1 g/dL (ref 12.0–15.0)
MCH: 32 pg (ref 26.0–34.0)
MCHC: 32.1 g/dL (ref 30.0–36.0)
MCV: 99.7 fL (ref 80.0–100.0)
Platelets: 185 10*3/uL (ref 150–400)
RBC: 3.78 MIL/uL — ABNORMAL LOW (ref 3.87–5.11)
RDW: 13.7 % (ref 11.5–15.5)
WBC: 8.9 10*3/uL (ref 4.0–10.5)
nRBC: 0 % (ref 0.0–0.2)

## 2020-02-20 NOTE — TOC CAGE-AID Note (Signed)
Transition of Care [facility]) - CAGE-AID Screening   Patient Details  Name: Lisa Murray MRN: [id number] Date of Birth: 01-08-1931  [facility]) CM/SW Contact:    Emeterio Reeve, [place] Phone Number: 02/20/2020, 1:49 PM   Clinical Narrative:  CSW met with pt at bedside. CSW introduced self and explained her role at the hospital.  PT denies alcohol use and substance use. Pt did not need any resources at this time.   CAGE-AID Screening:    Have You Ever Felt You Ought to Cut Down on Your Drinking or Drug Use?: No Have People Annoyed You By Critizing Your Drinking Or Drug Use?: No Have You Felt Bad Or Guilty About Your Drinking Or Drug Use?: No Have You Ever Had a Drink or Used Drugs First Thing In The Morning to Steady Your Nerves or to Get Rid of a Hangover?: No CAGE-AID Score: 0  Substance Abuse Education Offered: Yes     Blima Ledger, [place] Social Worker [phone]

## 2020-02-20 NOTE — Progress Notes (Signed)
[facility] Progress Note     Subjective: CC:  Pain currently controlled - she is sitting in chair and says sitting up helps. Worked with PT this AM. Tolerating PO. Voiding independently.   States she has 3 family members (great neices/a nephew) that live close by but all work full time so they cant stay with her at home, but on a few random days that they are off may be able to help her. States they are all working this weekend. She says is going to reach out to friends from church to see if anyone can stay with her.  Objective: Vital signs in last 24 hours: Temp:  [97.7 F (36.5 C)-98.2 F (36.8 C)] 97.7 F (36.5 C) (09/03 0558) Pulse Rate:  [68-110] 79 (09/03 0558) Resp:  [16-24] 16 (09/03 0558) BP: (106-156)/(63-92) 131/75 (09/03 0558) SpO2:  [96 %-99 %] 98 % (09/03 0558) Weight:  [50.8 kg] 50.8 kg (09/02 0905) Last BM Date: 02/17/20  Intake/Output from previous day: 09/02 0701 - 09/03 0700 In: 620 [P.O.:120; IV Piggyback:500] Out: 250 [Urine:250] Intake/Output this shift: No intake/output data recorded.  PE: Gen:  Alert, NAD, pleasant Card:  Regular rate and rhythm, pedal pulses 2+ BL Pulm:  Normal effort, clear to auscultation bilaterally, appropriately tender left posterior and inferior chest wall without crepitus  Abd: Soft, non-tender, non-distended, bowel sounds present Skin: warm and dry, no rashes  Psych: A&Ox3   Lab Results:  Recent Labs    02/19/20 0904 02/20/20 0216  WBC 11.8* 8.9  HGB 13.2 12.1  HCT 42.1 37.7  PLT 205 185   BMET Recent Labs    02/19/20 0904 02/20/20 0216  NA 143 142  K 3.7 3.9  CL 109 109  CO2 25 25  GLUCOSE 103* 104*  BUN 13 10  CREATININE 1.05* 0.87  CALCIUM 9.8 9.1   PT/INR No results for input(s): LABPROT, INR in the last 72 hours. CMP     Component Value Date/Time   NA 142 02/20/2020 0216   K 3.9 02/20/2020 0216   CL 109 02/20/2020 0216   CO2 25 02/20/2020 0216   GLUCOSE 104 (H) 02/20/2020 0216    BUN 10 02/20/2020 0216   CREATININE 0.87 02/20/2020 0216   CALCIUM 9.1 02/20/2020 0216   PROT 7.3 02/19/2020 0904   ALBUMIN 3.6 02/19/2020 0904   AST 26 02/19/2020 0904   ALT 18 02/19/2020 0904   ALKPHOS 39 02/19/2020 0904   BILITOT 0.7 02/19/2020 0904   GFRNONAA 60 (L) 02/20/2020 0216   GFRAA >60 02/20/2020 0216   Lipase  No results found for: LIPASE     Studies/Results: DG Chest 2 View  Result Date: 02/19/2020 CLINICAL DATA:  Fall. EXAM: CHEST - 2 VIEW COMPARISON:  January 20, 2018. FINDINGS: The heart size and mediastinal contours are within normal limits. Both lungs are clear. Stable large hiatal or left diaphragmatic hernia is noted. No pneumothorax or pleural effusion is noted. The visualized skeletal structures are unremarkable. IMPRESSION: No active cardiopulmonary disease. Stable large hiatal or diaphragmatic cardia. Electronically Signed   By: Lupita Raider M.D.   On: 02/19/2020 09:40   DG Lumbar Spine Complete  Result Date: 02/19/2020 CLINICAL DATA:  Fall. EXAM: LUMBAR SPINE - COMPLETE 4+ VIEW COMPARISON:  January 20, 2018. FINDINGS: Severe compression deformity of L1 vertebral body is noted with moderate compression deformity of L2 vertebral body, consistent with old fractures. Mild superior endplate depression of L4 vertebral body is noted most consistent with old  fracture. No definite acute fracture or spondylolisthesis is noted. Moderate levoscoliosis of lumbar spine is noted. IMPRESSION: Probable old fractures involving L1 and L2 vertebral bodies. Mild superior endplate depression of L4 vertebral body is noted most consistent with old fracture. No definite acute fracture is noted. Electronically Signed   By: Lupita Raider M.D.   On: 02/19/2020 09:42   DG Pelvis 1-2 Views  Result Date: 02/19/2020 CLINICAL DATA:  Fall. EXAM: PELVIS - 1-2 VIEW COMPARISON:  Nov 12, 2019. FINDINGS: There is no evidence of pelvic fracture or diastasis. No pelvic bone lesions are seen.  IMPRESSION: Negative. Electronically Signed   By: Lupita Raider M.D.   On: 02/19/2020 09:43   CT Head Wo Contrast  Result Date: 02/19/2020 CLINICAL DATA:  Head trauma. EXAM: CT HEAD WITHOUT CONTRAST TECHNIQUE: Contiguous axial images were obtained from the base of the skull through the vertex without intravenous contrast. COMPARISON:  None. FINDINGS: Brain: No evidence of acute large vascular territory infarction, hemorrhage, hydrocephalus, extra-axial collection or mass lesion/mass effect. Small hypodensity in the right thalamus. Mild to moderate diffuse cerebral volume loss with ex vacuo ventricular dilation. Patchy white matter hypoattenuation is nonspecific but likely the sequela of chronic microvascular ischemic disease. Vascular: Calcific intracranial atherosclerosis. No hyperdense vessel identified. Skull: Normal. Negative for fracture or focal lesion.89 Sinuses/Orbits: Mild scattered paranasal sinus mucosal thickening. Other: No mastoid effusions. See CT chest for characterization a findings seen on the SCOUT. IMPRESSION: 1. No acute traumatic intracranial abnormality. 2. Small hypodensity in the right thalamus is consistent with an age-indeterminate lacunar infarct, favored remote. MRI could further evaluate if there is concern for acute infarct. Electronically Signed   By: Feliberto Harts MD   On: 02/19/2020 10:49   CT Chest Wo Contrast  Result Date: 02/19/2020 CLINICAL DATA:  Minor chest trauma.  Left lower back pain. EXAM: CT CHEST WITHOUT CONTRAST TECHNIQUE: Multidetector CT imaging of the chest was performed following the standard protocol without IV contrast. COMPARISON:  None. FINDINGS: Cardiovascular: Normal heart size. No pericardial effusion. Aortic and coronary atherosclerosis. Mediastinum/Nodes: Large hiatal hernia containing pancreas body/tail, stomach, and colon. No mediastinal adenopathy. Thyroidectomy. Lungs/Pleura: Mild scarring over the diaphragmatic hernia. There is no edema,  consolidation, effusion, or pneumothorax. Upper Abdomen: Large hernia as noted above. No acute finding in the abdomen. Small low-density in the right liver, likely cystic. Musculoskeletal: Remote L1 and L2 compression fractures. Posterior left tenth through twelfth rib fractures with only mild displacement. Severe glenohumeral osteoarthritis on the right IMPRESSION: 1. Posterior left tenth through twelfth rib fractures with up to mild displacement. No hemothorax or pneumothorax. 2. Remote L1 and L2 compression fractures. 3. No evidence of intrathoracic injury. 4. Large diaphragmatic hernia containing colon, pancreas, and stomach. Electronically Signed   By: Marnee Spring M.D.   On: 02/19/2020 10:47   CT Cervical Spine Wo Contrast  Result Date: 02/19/2020 CLINICAL DATA:  Neck trauma.  Fell at home. EXAM: CT CERVICAL SPINE WITHOUT CONTRAST TECHNIQUE: Multidetector CT imaging of the cervical spine was performed without intravenous contrast. Multiplanar CT image reconstructions were also generated. COMPARISON:  None. FINDINGS: Alignment: Normal. Skull base and vertebrae: No acute fracture. No primary bone lesion or focal pathologic process. Diffuse osteopenia. Soft tissues and spinal canal: No prevertebral fluid or swelling. No visible large canal hematoma. Degenerative changes: Craniocervical degenerative change. Multilevel mild-to-moderate degenerative disc disease, greatest at C5-C6. Posterior disc bulge at C4-C5 which effaces the ventral CSF and contacts the ventral cord without evidence high-grade stenosis.  Upper chest: Negative. Other: Atherosclerotic vascular calcifications. IMPRESSION: 1. No evidence of acute fracture or malalignment. 2. Degenerative changes, including C4-C5 posterior disc bulge which effaces the ventral CSF without evidence of advanced canal stenosis. Electronically Signed   By: Feliberto Harts MD   On: 02/19/2020 10:56   DG Chest Port 1 View  Result Date: 02/20/2020 CLINICAL DATA:   Fall with LEFT chest and rib pain. EXAM: PORTABLE CHEST 1 VIEW COMPARISON:  02/19/2020 FINDINGS: The cardiomediastinal silhouette is unchanged with a very large LEFT diaphragmatic hernia again noted. There is no evidence of focal airspace disease, pulmonary edema, suspicious pulmonary nodule/mass, pleural effusion, or pneumothorax. Known LEFT rib fractures are difficult to visualize on this study. IMPRESSION: 1. No evidence of acute cardiopulmonary disease. 2. Very large LEFT diaphragmatic hernia. Electronically Signed   By: Harmon Pier M.D.   On: 02/20/2020 06:55    Anti-infectives: Anti-infectives (From admission, onward)   None     Assessment/Plan Ground level fall Left Rib FX 10-12 - multimodal pain control, IS 10x q 1h, CXR this morning without PTX AKI - resolved, Cr 0.87  Left diaphragmatic hernia - asymptomatic  Hypothyroidism - home synthroid HTN - home Norvasc re-ordered Home meds re-ordered       FEN: regular ID: none VTE: SCD's, Lovenox Dispo: Med-surg observation, PT/OT evals   I called her nephew, Trey Paula [phone]), and gave him an update. He is going to assist with getting the patient some help/supervision at home.    LOS: 0 days    Hosie Spangle, [facility] Please see Amion for pager number during day hours 7:00am-4:30pm

## 2020-02-20 NOTE — TOC Initial Note (Signed)
Transition of Care (TOC) - Initial/Assessment Note    Patient Details  Name: Lisa Murray MRN: [id number] Date of Birth: 09/18/1930  Transition of Care (TOC) CM/SW Contact:      , LCSWA Phone Number: 02/20/2020, 1:52 PM  Clinical Narrative:                  CSW met with pt at bedside. CSW introduced self and explained her role at the hospital.  Pt stated that she lives alone at home. Pt reports PTA she was independent and able to complete all ADL's.   CSW reviewed pt/ot reccs of Home with HH. CSW inquired if pt goings home will she be able to get 24 hour support for a few weeks. Pt stated she can get some of her church members to sit with her for a few hours but doubts she can have someone 24 hours a day.   Pt states she is already set of [facility].   Expected Discharge Plan: Home w Home Health Services Barriers to Discharge: Continued Medical Work up   Patient Goals and CMS Choice Patient states their goals for this hospitalization and ongoing recovery are:: To return home CMS Medicare.gov Compare Post Acute Care list provided to:: Patient Choice offered to / list presented to : Patient  Expected Discharge Plan and Services Expected Discharge Plan: Home w Home Health Services       Living arrangements for the past 2 months: Single Family Home                                      Prior Living Arrangements/Services Living arrangements for the past 2 months: Single Family Home Lives with:: Self Patient language and need for interpreter reviewed:: Yes        Need for Family Participation in Patient Care: Yes (Comment) Care giver support system in place?: Yes (comment)   Criminal Activity/Legal Involvement Pertinent to Current Situation/Hospitalization: No - Comment as needed  Activities of Daily Living      Permission Sought/Granted   Permission granted to share information with : Yes, Verbal Permission Granted      Permission granted to share info w AGENCY: Home Health        Emotional Assessment Appearance:: Appears stated age Attitude/Demeanor/Rapport: Engaged Affect (typically observed): Appropriate Orientation: : Oriented to Self, Oriented to Place, Oriented to  Time, Oriented to Situation Alcohol / Substance Use: Not Applicable Psych Involvement: No (comment)  Admission diagnosis:  Multiple rib fractures [S22.49XA] Left rib fracture [S22.32XA] Closed fracture of multiple ribs of left side, initial encounter [S22.42XA] Patient Active Problem List   Diagnosis Date Noted  . Multiple rib fractures 02/19/2020   PCP:  Wolters, Sharon, MD Pharmacy:   [id number] - [place], [place] - [address] [place] [address] Phone: [phone] Fax: [phone]     Social Determinants of Health (SDOH) Interventions    Readmission Risk Interventions No flowsheet data found.   , LCSWA, LCASA Clinical Social Worker [phone]  

## 2020-02-20 NOTE — Discharge Summary (Signed)
[facility] Discharge Summary   Patient ID: Lisa Murray MRN: [id number] DOB/AGE: 84-Jan-1932 84 y.o.  Admit date: 02/19/2020 Discharge date: 02/21/2020  Admitting Diagnosis: Ground level fall Left rib fractures   Discharge Diagnosis Patient Active Problem List   Diagnosis Date Noted  . Multiple rib fractures 02/19/2020   Consultants N/A  Imaging: DG Chest 2 View  Result Date: 02/19/2020 CLINICAL DATA:  Fall. EXAM: CHEST - 2 VIEW COMPARISON:  January 20, 2018. FINDINGS: The heart size and mediastinal contours are within normal limits. Both lungs are clear. Stable large hiatal or left diaphragmatic hernia is noted. No pneumothorax or pleural effusion is noted. The visualized skeletal structures are unremarkable. IMPRESSION: No active cardiopulmonary disease. Stable large hiatal or diaphragmatic cardia. Electronically Signed   By: Lupita Raider M.D.   On: 02/19/2020 09:40   DG Lumbar Spine Complete  Result Date: 02/19/2020 CLINICAL DATA:  Fall. EXAM: LUMBAR SPINE - COMPLETE 4+ VIEW COMPARISON:  January 20, 2018. FINDINGS: Severe compression deformity of L1 vertebral body is noted with moderate compression deformity of L2 vertebral body, consistent with old fractures. Mild superior endplate depression of L4 vertebral body is noted most consistent with old fracture. No definite acute fracture or spondylolisthesis is noted. Moderate levoscoliosis of lumbar spine is noted. IMPRESSION: Probable old fractures involving L1 and L2 vertebral bodies. Mild superior endplate depression of L4 vertebral body is noted most consistent with old fracture. No definite acute fracture is noted. Electronically Signed   By: Lupita Raider M.D.   On: 02/19/2020 09:42   DG Pelvis 1-2 Views  Result Date: 02/19/2020 CLINICAL DATA:  Fall. EXAM: PELVIS - 1-2 VIEW COMPARISON:  Nov 12, 2019. FINDINGS: There is no evidence of pelvic fracture or diastasis. No pelvic bone lesions are seen. IMPRESSION: Negative.  Electronically Signed   By: Lupita Raider M.D.   On: 02/19/2020 09:43   CT Head Wo Contrast  Result Date: 02/19/2020 CLINICAL DATA:  Head trauma. EXAM: CT HEAD WITHOUT CONTRAST TECHNIQUE: Contiguous axial images were obtained from the base of the skull through the vertex without intravenous contrast. COMPARISON:  None. FINDINGS: Brain: No evidence of acute large vascular territory infarction, hemorrhage, hydrocephalus, extra-axial collection or mass lesion/mass effect. Small hypodensity in the right thalamus. Mild to moderate diffuse cerebral volume loss with ex vacuo ventricular dilation. Patchy white matter hypoattenuation is nonspecific but likely the sequela of chronic microvascular ischemic disease. Vascular: Calcific intracranial atherosclerosis. No hyperdense vessel identified. Skull: Normal. Negative for fracture or focal lesion.89 Sinuses/Orbits: Mild scattered paranasal sinus mucosal thickening. Other: No mastoid effusions. See CT chest for characterization a findings seen on the SCOUT. IMPRESSION: 1. No acute traumatic intracranial abnormality. 2. Small hypodensity in the right thalamus is consistent with an age-indeterminate lacunar infarct, favored remote. MRI could further evaluate if there is concern for acute infarct. Electronically Signed   By: Feliberto Harts MD   On: 02/19/2020 10:49   CT Chest Wo Contrast  Result Date: 02/19/2020 CLINICAL DATA:  Minor chest trauma.  Left lower back pain. EXAM: CT CHEST WITHOUT CONTRAST TECHNIQUE: Multidetector CT imaging of the chest was performed following the standard protocol without IV contrast. COMPARISON:  None. FINDINGS: Cardiovascular: Normal heart size. No pericardial effusion. Aortic and coronary atherosclerosis. Mediastinum/Nodes: Large hiatal hernia containing pancreas body/tail, stomach, and colon. No mediastinal adenopathy. Thyroidectomy. Lungs/Pleura: Mild scarring over the diaphragmatic hernia. There is no edema, consolidation, effusion,  or pneumothorax. Upper Abdomen: Large hernia as noted above. No acute  finding in the abdomen. Small low-density in the right liver, likely cystic. Musculoskeletal: Remote L1 and L2 compression fractures. Posterior left tenth through twelfth rib fractures with only mild displacement. Severe glenohumeral osteoarthritis on the right IMPRESSION: 1. Posterior left tenth through twelfth rib fractures with up to mild displacement. No hemothorax or pneumothorax. 2. Remote L1 and L2 compression fractures. 3. No evidence of intrathoracic injury. 4. Large diaphragmatic hernia containing colon, pancreas, and stomach. Electronically Signed   By: Marnee Spring M.D.   On: 02/19/2020 10:47   CT Cervical Spine Wo Contrast  Result Date: 02/19/2020 CLINICAL DATA:  Neck trauma.  Fell at home. EXAM: CT CERVICAL SPINE WITHOUT CONTRAST TECHNIQUE: Multidetector CT imaging of the cervical spine was performed without intravenous contrast. Multiplanar CT image reconstructions were also generated. COMPARISON:  None. FINDINGS: Alignment: Normal. Skull base and vertebrae: No acute fracture. No primary bone lesion or focal pathologic process. Diffuse osteopenia. Soft tissues and spinal canal: No prevertebral fluid or swelling. No visible large canal hematoma. Degenerative changes: Craniocervical degenerative change. Multilevel mild-to-moderate degenerative disc disease, greatest at C5-C6. Posterior disc bulge at C4-C5 which effaces the ventral CSF and contacts the ventral cord without evidence high-grade stenosis. Upper chest: Negative. Other: Atherosclerotic vascular calcifications. IMPRESSION: 1. No evidence of acute fracture or malalignment. 2. Degenerative changes, including C4-C5 posterior disc bulge which effaces the ventral CSF without evidence of advanced canal stenosis. Electronically Signed   By: Feliberto Harts MD   On: 02/19/2020 10:56   DG Chest Port 1 View  Result Date: 02/20/2020 CLINICAL DATA:  Fall with LEFT chest and  rib pain. EXAM: PORTABLE CHEST 1 VIEW COMPARISON:  02/19/2020 FINDINGS: The cardiomediastinal silhouette is unchanged with a very large LEFT diaphragmatic hernia again noted. There is no evidence of focal airspace disease, pulmonary edema, suspicious pulmonary nodule/mass, pleural effusion, or pneumothorax. Known LEFT rib fractures are difficult to visualize on this study. IMPRESSION: 1. No evidence of acute cardiopulmonary disease. 2. Very large LEFT diaphragmatic hernia. Electronically Signed   By: Harmon Pier M.D.   On: 02/20/2020 06:55    Procedures None   HPI:  Ms. Dalayla Aldredge is a [name] with a PMH osteoporosis, hypothyroidism, and dizziness who presented to the hospital after a ground level fall at home this morning. Patient states she was walking from her room to the living room this morning around 0700 when she fell and hit her left side on a metal magazine rack. She denies LOC. Was unable to get up from the ground independently. Luckily was holding her portable house phone so was able to call a family member for help.   Her cc in left posterior rib pain, worse with inspiration/movement. She denies the use of blood thinners. Reports having a "wobbly" gait lately, and has had 3 falls at home in the last 6 weeks. For this reason, she says her PCP (Dr. Dan Humphreys) has recently ordered her home health PT and was in the process of ordering her an assistive device for walking. She recently had her drivers licence renewed and still drives herself to all of her doctors appointments. She lives at home by herself. She states she has received her COVID vaccines (mederna).   Hospital Course:  Work up revealed left rib FX 10-12 and AKI (SCr 1.05) and the patient was admitted for pain control and PT/OT evals. Physical and occupational therapies recommend home with home health and 24 hours supervision.  On 02/21/20 the patients vitals were stable, pain controlled  on PO meds, mobilizing, and felt stable for  discharge home.  Physical Exam:  Gen:  Alert, NAD, pleasant Card:  Regular rate and rhythm, pedal pulses 2+ BL Pulm:  Normal effort, clear to auscultation bilaterally, appropriately tender left posterior and inferior chest wall without crepitus   Poor inspiratory effort - she pulls about 400 cc on the IS, but part of this is how well she understands Abd: Soft, non-tender, non-distended, bowel sounds present Skin: warm and dry, no rashes  Psych: A&Ox3   Allergies as of 02/21/2020   No Known Allergies     Medication List    STOP taking these medications   meclizine 12.5 MG tablet Commonly known as: ANTIVERT     TAKE these medications   ALPRAZolam 0.25 MG tablet Commonly known as: XANAX Take 0.25-0.5 mg by mouth See admin instructions. Take 1 tablet (0.25 mg) in the morning and take 2 tablet (0.50mg )at night   amLODipine 2.5 MG tablet Commonly known as: NORVASC Take 2.5 mg by mouth daily.   APAP ARTHRITIS PO Take 2 tablets by mouth every 6 (six) hours as needed (arthritis).   Azelastine HCl 0.15 % Soln Place 2 sprays into both nostrils daily.   buPROPion 150 MG 12 hr tablet Commonly known as: WELLBUTRIN SR Take 150 mg by mouth 2 (two) times daily.   docusate sodium 50 MG capsule Commonly known as: COLACE Take 50 mg by mouth every 8 (eight) hours as needed for mild constipation.   FISH OIL PO Take 1 capsule by mouth daily.   Icy Hot Advanced Pain Relief 16-11 % Crea Generic drug: Menthol-Camphor Apply 1 application topically at bedtime as needed (pain to shoulders and lower back).   mirtazapine 30 MG disintegrating tablet Commonly known as: REMERON SOL-TAB Take 30 mg by mouth at bedtime.   MUCINEX PO Take 1 tablet by mouth every 6 (six) hours as needed (mucas).   rosuvastatin 10 MG tablet Commonly known as: CRESTOR Take 10 mg by mouth daily.   Synthroid 88 MCG tablet Generic drug: levothyroxine Take 88 mcg by mouth every morning.   VITAMIN D PO Take 2  capsules by mouth daily.            Durable Medical Equipment  (From admission, onward)         Start     Ordered   02/20/20 1209  For home use only DME Walker rolling  Once       Question Answer Comment  Walker: With 5 Inch Wheels   Patient needs a walker to treat with the following condition Weakness      02/20/20 1208         Spoke with nephew, Dan Maker.  He is going to arrange for people to visit her.  His daughter, Madolyn Frieze, is going to pick her up. She lives by herself.  But she is followed by [facility].  She has PT and OT. She is going home with a walker. She knows that she needs to make an appt with her PCP within 2 to 3 weeks after discharge.  Signed: Hosie Spangle, [facility] 02/21/2020, 8:17 AM   Ovidio Kin, MD, [facility] Office phone:  [phone]

## 2020-02-20 NOTE — Care Management (Signed)
Assessment completed by another [facility] member.  Patient active with [facility] prior to admission. Patient wants to stay with [facility]. Tiffany with [facility] aware.   Patient already has bedside commode ( 3 in1 ) at home , but does not a walker. Walker ordered with Debbie with [employer].   Patient's great niece or great nephew will provide transportation home at discharge.   Ronny Flurry RN

## 2020-02-20 NOTE — Evaluation (Signed)
Physical Therapy Evaluation Patient Details Name: Lisa Murray MRN: [id number] DOB: 09-May-1931 Today's Date: 02/20/2020   History of Present Illness  Pt is a 86 y.o. F with significant PMH of osteoporosis, hypothyroidism who presents after a fall with L rib fractures 10-12. Also found to have AKI.   Clinical Impression  Prior to admission, pt lives alone and is independent. Has history of ~3 recent falls. Pt presents with decreased functional mobility secondary to pain, balance deficits and decreased endurance. Pt ambulating 30 feet with a walker at a min guard assist level. Provided pt with IS and pt performed. Recommended increased supervision/assist initially upon discharge home given pt is refusing SNF. Pt verbalized understanding. Will continue to progress as tolerated.     Follow Up Recommendations Home health PT;Supervision/Assistance - 24 hour (would be helpful initially)    Equipment Recommendations  Rolling walker with 5" wheels;3in1 (PT)    Recommendations for Other Services       Precautions / Restrictions Precautions Precautions: Fall Restrictions Weight Bearing Restrictions: No      Mobility  Bed Mobility Overal bed mobility: Needs Assistance Bed Mobility: Supine to Sit     Supine to sit: Min assist     General bed mobility comments: Light minA to progress trunk to upright. Increased time/effort  Transfers Overall transfer level: Needs assistance Equipment used: Rolling walker (2 wheeled) Transfers: Sit to/from Stand Sit to Stand: Min guard         General transfer comment: Min guard to rise to stand, pt pulling from walker  Ambulation/Gait Ambulation/Gait assistance: Min guard Gait Distance (Feet): 30 Feet Assistive device: Rolling walker (2 wheeled) Gait Pattern/deviations: Step-through pattern;Decreased stride length Gait velocity: decreased Gait velocity interpretation: <1.8 ft/sec, indicate of risk for recurrent falls General Gait Details:  Very slow pace but overall no overt LOB. Cues for walker proximity  Stairs            Wheelchair Mobility    Modified Rankin (Stroke Patients Only)       Balance Overall balance assessment: Needs assistance Sitting-balance support: Feet supported Sitting balance-Leahy Scale: Good     Standing balance support: Bilateral upper extremity supported Standing balance-Leahy Scale: Poor Standing balance comment: reliant on external support                             Pertinent Vitals/Pain Pain Assessment: Faces Faces Pain Scale: Hurts even more Pain Location: ribs, back, neck Pain Descriptors / Indicators: Grimacing;Guarding Pain Intervention(s): Limited activity within patient's tolerance;Monitored during session;Repositioned    Home Living Family/patient expects to be discharged to:: Private residence Living Arrangements: Alone Available Help at Discharge: Family;Available PRN/intermittently (nephew, great nephew, great niece) Type of Home: House Home Access: Stairs to enter Entrance Stairs-Rails: Doctor, general practice of Steps: 2 Home Layout: One level Home Equipment: None      Prior Function Level of Independence: Independent         Comments: History of recent falls (~3)     Hand Dominance        Extremity/Trunk Assessment   Upper Extremity Assessment Upper Extremity Assessment: Defer to OT evaluation    Lower Extremity Assessment Lower Extremity Assessment: Overall WFL for tasks assessed       Communication   Communication: HOH  Cognition Arousal/Alertness: Awake/alert Behavior During Therapy: WFL for tasks assessed/performed Overall Cognitive Status: Within Functional Limits for tasks assessed  General Comments      Exercises Other Exercises Other Exercises: IS use x 10 reps   Assessment/Plan    PT Assessment Patient needs continued PT services  PT  Problem List Decreased strength;Decreased activity tolerance;Decreased balance;Decreased mobility;Pain       PT Treatment Interventions DME instruction;Gait training;Functional mobility training;Therapeutic activities;Therapeutic exercise;Balance training;Patient/family education;Stair training    PT Goals (Current goals can be found in the Care Plan section)  Acute Rehab PT Goals Patient Stated Goal: go home PT Goal Formulation: With patient Time For Goal Achievement: 03/05/20 Potential to Achieve Goals: Good    Frequency Min 5X/week   Barriers to discharge        Co-evaluation               AM-PAC PT "6 Clicks" Mobility  Outcome Measure Help needed turning from your back to your side while in a flat bed without using bedrails?: None Help needed moving from lying on your back to sitting on the side of a flat bed without using bedrails?: None Help needed moving to and from a bed to a chair (including a wheelchair)?: A Little Help needed standing up from a chair using your arms (e.g., wheelchair or bedside chair)?: A Little Help needed to walk in hospital room?: A Little Help needed climbing 3-5 steps with a railing? : A Lot 6 Click Score: 19    End of Session Equipment Utilized During Treatment: Gait belt Activity Tolerance: Patient tolerated treatment well Patient left: in chair;with call bell/phone within reach;with chair alarm set Nurse Communication: Mobility status PT Visit Diagnosis: Unsteadiness on feet (R26.81);History of falling (Z91.81);Difficulty in walking, not elsewhere classified (R26.2);Pain Pain - part of body:  (ribs, back)    Time: 5993-5701 PT Time Calculation (min) (ACUTE ONLY): 27 min   Charges:   PT Evaluation $PT Eval Moderate Complexity: 1 Mod PT Treatments $Gait Training: 8-22 mins          Lillia Pauls, PT, DPT Acute Rehabilitation Services Pager [phone] Office [phone]   Norval Morton 02/20/2020, 9:30 AM

## 2020-02-20 NOTE — Evaluation (Signed)
Occupational Therapy Evaluation Patient Details Name: Lisa Murray MRN: [id number] DOB: 06-Jun-1931 Today's Date: 02/20/2020    History of Present Illness Pt is a 86 y.o. F with significant PMH of osteoporosis, hypothyroidism who presents after a fall with L rib fractures 10-12. Also found to have AKI.    Clinical Impression   Patient sitting in recliner, finished breakfast.  Agreed to up and grooming task sink side.  Patient presents with L rib and neck pain, decreased safety with RW, decreased balance, declines to LB ADL and decreased functional mobility.  Patient stands very erect and guarding with bending neck and trunk forward to see her surroundings better.  PLOF: patient states 5 weeks of HH OT/PT, hx of falls, but living alone and driving.  No external supports for home management, meds or meal prep.  Continue to follow in acute 2x/wk with continued HH upon discharge.      Follow Up Recommendations  Home health OT    Equipment Recommendations  Tub/shower seat;3 in 1 bedside commode    Recommendations for Other Services       Precautions / Restrictions Precautions Precautions: Fall Restrictions Weight Bearing Restrictions: No      Mobility Bed Mobility Overal bed mobility: Needs Assistance Bed Mobility: Supine to Sit     Supine to sit: Min assist     General bed mobility comments: Light minA to progress trunk to upright. Increased time/effort  Transfers Overall transfer level: Needs assistance Equipment used: Rolling walker (2 wheeled) Transfers: Sit to/from [employer] Sit to Stand: Min assist;Mod assist Stand pivot transfers: Min guard       General transfer comment: Min guard to rise to stand, pt pulling from walker    Balance Overall balance assessment: Needs assistance Sitting-balance support: Feet supported Sitting balance-Leahy Scale: Good     Standing balance support: Bilateral upper extremity supported Standing balance-Leahy  Scale: Fair Standing balance comment: reliant on external support                           ADL either performed or assessed with clinical judgement   ADL Overall ADL's : Needs assistance/impaired Eating/Feeding: Set up;Sitting   Grooming: Set up;Min guard;Sitting       Lower Body Bathing: Moderate assistance;Supervison/ safety;Set up;Sit to/from stand   Upper Body Dressing : Moderate assistance;Sitting;Standing Upper Body Dressing Details (indicate cue type and reason): lines and leads Lower Body Dressing: Moderate assistance;Sit to/from stand Lower Body Dressing Details (indicate cue type and reason): rib pain Toilet Transfer: Supervision/safety;Set up;Min Writer Details (indicate cue type and reason): Cues for RW management Toileting- Clothing Manipulation and Hygiene: Minimal assistance;Sit to/from stand       Functional mobility during ADLs: Min guard;Rolling walker General ADL Comments: patient is standing very upright, hesitant to look down or bend at waist due to neck and rib pain.  VC's for positioning of toilet prior to sitting.     Vision Baseline Vision/History: No visual deficits Patient Visual Report: No change from baseline       Perception     Praxis      Pertinent Vitals/Pain Pain Assessment: 0-10 Pain Score: 9  Faces Pain Scale: Hurts even more Pain Location: ribs, back, neck Pain Descriptors / Indicators: Grimacing;Guarding;Sharp Pain Intervention(s): Monitored during session;RN gave pain meds during session     Hand Dominance Right   Extremity/Trunk Assessment Upper Extremity Assessment Upper Extremity Assessment: Overall WFL for tasks assessed  Lower Extremity Assessment Lower Extremity Assessment: Defer to PT evaluation   Cervical / Trunk Assessment Cervical / Trunk Assessment: Kyphotic   Communication Communication Communication: HOH   Cognition Arousal/Alertness: Awake/alert Behavior  During Therapy: WFL for tasks assessed/performed Overall Cognitive Status: Within Functional Limits for tasks assessed                                     General Comments       Exercises Exercises: Other exercises Other Exercises Other Exercises: IS use x 10 reps   Shoulder Instructions      Home Living Family/patient expects to be discharged to:: Private residence Living Arrangements: Alone Available Help at Discharge: Available PRN/intermittently;Friend(s);Other (Comment) (was receiving HH times 5 weeks) Type of Home: House Home Access: Stairs to enter [employer] of Steps: 2 Entrance Stairs-Rails: Right;Left Home Layout: One level     Bathroom Shower/Tub: Chief Strategy Officer: Standard     Home Equipment: None          Prior Functioning/Environment Level of Independence: Independent        Comments: History of recent falls (~3)        OT Problem List: Decreased strength;Decreased activity tolerance;Impaired balance (sitting and/or standing);Decreased knowledge of use of DME or AE;Pain      OT Treatment/Interventions: Self-care/ADL training;Therapeutic exercise;DME and/or AE instruction;Therapeutic activities;Balance training    OT Goals(Current goals can be found in the care plan section) Acute Rehab OT Goals Patient Stated Goal: I need to walk better so I can live by myself, and take care of myself. OT Goal Formulation: With patient Time For Goal Achievement: 03/04/20 Potential to Achieve Goals: Fair ADL Goals Pt Will Perform Grooming: with set-up;standing Pt Will Perform Upper Body Bathing: with set-up;with supervision;sitting Pt Will Perform Lower Body Bathing: with set-up;with supervision;with adaptive equipment;sit to/from stand Pt Will Perform Upper Body Dressing: with set-up;with supervision;sitting;standing Pt Will Perform Lower Body Dressing: with set-up;with supervision;sit to/from stand Pt Will Transfer  to Toilet: with supervision;ambulating;regular height toilet Pt Will Perform Tub/Shower Transfer: with set-up;with supervision;with min guard assist;ambulating  OT Frequency: Min 2X/week   Barriers to D/C: Decreased caregiver support          Co-evaluation              AM-PAC OT "6 Clicks" Daily Activity     Outcome Measure Help from another person eating meals?: None Help from another person taking care of personal grooming?: A Little Help from another person toileting, which includes using toliet, bedpan, or urinal?: A Little Help from another person bathing (including washing, rinsing, drying)?: A Lot Help from another person to put on and taking off regular upper body clothing?: A Lot Help from another person to put on and taking off regular lower body clothing?: A Lot 6 Click Score: 16   End of Session Equipment Utilized During Treatment: Rolling walker  Activity Tolerance: Patient limited by pain Patient left: in chair;with call bell/phone within reach;with chair alarm set;with nursing/sitter in room  OT Visit Diagnosis: Unsteadiness on feet (R26.81);Repeated falls (R29.6);Pain;History of falling (Z91.81);Muscle weakness (generalized) (M62.81) Pain - Right/Left: Left (ribs)                Time: 2725-3664 OT Time Calculation (min): 30 min Charges:  OT General Charges $OT Visit: 1 Visit OT Evaluation $OT Eval Moderate Complexity: 1 Mod  02/20/2020  Rich, OTR/L  Acute  Rehabilitation Services  Office:  [phone]   Lisa Murray 02/20/2020, 11:24 AM

## 2020-02-21 ENCOUNTER — Emergency Department (HOSPITAL_COMMUNITY)
Admission: EM | Admit: 2020-02-21 | Discharge: 2020-02-24 | Disposition: A | Discharge status: Home / Self Care | Payer: PPO | Source: Home / Self Care | Attending: Emergency Medicine | Admitting: Emergency Medicine

## 2020-02-21 ENCOUNTER — Encounter (HOSPITAL_COMMUNITY): Payer: Self-pay | Admitting: Emergency Medicine

## 2020-02-21 ENCOUNTER — Other Ambulatory Visit: Payer: Self-pay

## 2020-02-21 DIAGNOSIS — Y999 Unspecified external cause status: Secondary | ICD-10-CM | POA: Insufficient documentation

## 2020-02-21 DIAGNOSIS — K5901 Slow transit constipation: Secondary | ICD-10-CM

## 2020-02-21 DIAGNOSIS — Z20822 Contact with and (suspected) exposure to covid-19: Secondary | ICD-10-CM | POA: Insufficient documentation

## 2020-02-21 DIAGNOSIS — Z7409 Other reduced mobility: Secondary | ICD-10-CM | POA: Insufficient documentation

## 2020-02-21 DIAGNOSIS — S2242XD Multiple fractures of ribs, left side, subsequent encounter for fracture with routine healing: Secondary | ICD-10-CM | POA: Insufficient documentation

## 2020-02-21 DIAGNOSIS — Y939 Activity, unspecified: Secondary | ICD-10-CM | POA: Insufficient documentation

## 2020-02-21 DIAGNOSIS — I10 Essential (primary) hypertension: Secondary | ICD-10-CM | POA: Diagnosis not present

## 2020-02-21 DIAGNOSIS — W228XXA Striking against or struck by other objects, initial encounter: Secondary | ICD-10-CM | POA: Insufficient documentation

## 2020-02-21 DIAGNOSIS — S2242XA Multiple fractures of ribs, left side, initial encounter for closed fracture: Secondary | ICD-10-CM | POA: Diagnosis not present

## 2020-02-21 DIAGNOSIS — Y92009 Unspecified place in unspecified non-institutional (private) residence as the place of occurrence of the external cause: Secondary | ICD-10-CM | POA: Insufficient documentation

## 2020-02-21 DIAGNOSIS — Z79899 Other long term (current) drug therapy: Secondary | ICD-10-CM | POA: Insufficient documentation

## 2020-02-21 DIAGNOSIS — W19XXXA Unspecified fall, initial encounter: Secondary | ICD-10-CM | POA: Diagnosis not present

## 2020-02-21 NOTE — ED Triage Notes (Signed)
Pt to triage via [facility].  Discharged from hospital around noon today.  Went home and unable to care for herself.  Nephew called PCP and told for pt to come back to ED for possible rehab or SNF placement.  Reports L rib fractures x 3.

## 2020-02-22 ENCOUNTER — Emergency Department (HOSPITAL_COMMUNITY): Payer: PPO

## 2020-02-22 DIAGNOSIS — S2242XA Multiple fractures of ribs, left side, initial encounter for closed fracture: Secondary | ICD-10-CM | POA: Diagnosis not present

## 2020-02-22 DIAGNOSIS — J9811 Atelectasis: Secondary | ICD-10-CM | POA: Diagnosis not present

## 2020-02-22 DIAGNOSIS — R0781 Pleurodynia: Secondary | ICD-10-CM | POA: Diagnosis not present

## 2020-02-22 LAB — URINALYSIS, ROUTINE W REFLEX MICROSCOPIC
Bilirubin Urine: NEGATIVE
Glucose, UA: NEGATIVE mg/dL
Ketones, ur: 5 mg/dL — AB
Nitrite: NEGATIVE
Protein, ur: NEGATIVE mg/dL
Specific Gravity, Urine: 1.013 (ref 1.005–1.030)
pH: 5 (ref 5.0–8.0)

## 2020-02-22 LAB — CBC
HCT: 42.4 % (ref 36.0–46.0)
Hemoglobin: 13.5 g/dL (ref 12.0–15.0)
MCH: 31.3 pg (ref 26.0–34.0)
MCHC: 31.8 g/dL (ref 30.0–36.0)
MCV: 98.1 fL (ref 80.0–100.0)
Platelets: 226 10*3/uL (ref 150–400)
RBC: 4.32 MIL/uL (ref 3.87–5.11)
RDW: 13.7 % (ref 11.5–15.5)
WBC: 12.5 10*3/uL — ABNORMAL HIGH (ref 4.0–10.5)
nRBC: 0 % (ref 0.0–0.2)

## 2020-02-22 LAB — BASIC METABOLIC PANEL
Anion gap: 12 (ref 5–15)
BUN: 16 mg/dL (ref 8–23)
CO2: 22 mmol/L (ref 22–32)
Calcium: 9.6 mg/dL (ref 8.9–10.3)
Chloride: 107 mmol/L (ref 98–111)
Creatinine, Ser: 0.89 mg/dL (ref 0.44–1.00)
GFR calc Af Amer: >60 mL/min (ref >60)
GFR calc non Af Amer: 58 mL/min — ABNORMAL LOW (ref >60)
Glucose, Bld: 105 mg/dL — ABNORMAL HIGH (ref 70–99)
Potassium: 3.7 mmol/L (ref 3.5–5.1)
Sodium: 141 mmol/L (ref 135–145)

## 2020-02-22 LAB — SARS CORONAVIRUS 2 BY RT PCR (HOSPITAL ORDER, PERFORMED IN ~~LOC~~ HOSPITAL LAB): SARS Coronavirus 2: NEGATIVE

## 2020-02-22 MED ORDER — MIRTAZAPINE 30 MG PO TBDP
30.0000 mg | ORAL_TABLET | Freq: Every day | ORAL | Status: DC
  Administered 2020-02-23 (×2): 30 mg via ORAL
  Filled 2020-02-22 (×3): qty 1

## 2020-02-22 MED ORDER — DOCUSATE SODIUM 100 MG PO CAPS
100.0000 mg | ORAL_CAPSULE | Freq: Two times a day (BID) | ORAL | Status: DC
  Administered 2020-02-22 – 2020-02-24 (×4): 100 mg via ORAL
  Filled 2020-02-22 (×4): qty 1

## 2020-02-22 MED ORDER — AMLODIPINE BESYLATE 5 MG PO TABS
2.5000 mg | ORAL_TABLET | Freq: Every day | ORAL | Status: DC
  Administered 2020-02-22 – 2020-02-24 (×3): 2.5 mg via ORAL
  Filled 2020-02-22 (×4): qty 1

## 2020-02-22 MED ORDER — LEVOTHYROXINE SODIUM 88 MCG PO TABS
88.0000 ug | ORAL_TABLET | Freq: Every morning | ORAL | Status: DC
  Administered 2020-02-22 – 2020-02-24 (×3): 88 ug via ORAL
  Filled 2020-02-22 (×3): qty 1

## 2020-02-22 MED ORDER — ROSUVASTATIN CALCIUM 5 MG PO TABS
10.0000 mg | ORAL_TABLET | Freq: Every day | ORAL | Status: DC
  Administered 2020-02-22 – 2020-02-24 (×3): 10 mg via ORAL
  Filled 2020-02-22 (×3): qty 2

## 2020-02-22 MED ORDER — OXYCODONE-ACETAMINOPHEN 5-325 MG PO TABS
1.0000 | ORAL_TABLET | Freq: Four times a day (QID) | ORAL | Status: DC | PRN
  Administered 2020-02-22 (×2): 2 via ORAL
  Administered 2020-02-24: 1 via ORAL
  Filled 2020-02-22: qty 1
  Filled 2020-02-22: qty 2
  Filled 2020-02-22: qty 1
  Filled 2020-02-22: qty 2

## 2020-02-22 MED ORDER — BUPROPION HCL ER (SR) 150 MG PO TB12
150.0000 mg | ORAL_TABLET | Freq: Two times a day (BID) | ORAL | Status: DC
  Administered 2020-02-22 – 2020-02-24 (×5): 150 mg via ORAL
  Filled 2020-02-22 (×6): qty 1

## 2020-02-22 MED ORDER — PANTOPRAZOLE SODIUM 20 MG PO TBEC
20.0000 mg | DELAYED_RELEASE_TABLET | Freq: Every day | ORAL | Status: DC
  Administered 2020-02-23 – 2020-02-24 (×2): 20 mg via ORAL
  Filled 2020-02-22 (×4): qty 1

## 2020-02-22 MED ORDER — POLYETHYLENE GLYCOL 3350 17 G PO PACK
17.0000 g | PACK | Freq: Every day | ORAL | Status: DC
  Administered 2020-02-22 – 2020-02-24 (×3): 17 g via ORAL
  Filled 2020-02-22 (×3): qty 1

## 2020-02-22 NOTE — ED Notes (Signed)
Pt is alert and oriented X4. However pt continues to ask repeat questions, such as, will I get a room or am I going to stay here. Am I going to be placed somewhere or what is happening.   This RN continues to answer questions.

## 2020-02-22 NOTE — Care Management (Signed)
Patient nephew called and spoke to him about SNF recommendation. He would prefer [facility] if able. Patient is fully vaccinated Moderna.

## 2020-02-22 NOTE — Care Management (Signed)
PT paged and they returned call, the patient has been assigned to be seen  By PT

## 2020-02-22 NOTE — NC FL2 (Signed)
[place] MEDICAID FL2 LEVEL OF CARE SCREENING TOOL     IDENTIFICATION  Patient Name: Lisa Murray Birthdate: 1931-05-26 Sex: female Admission Date (Current Location): 02/21/2020  [facility] and [place] Number:  Producer, television/film/video and Address:  [facility], [address] [place], [place] [address]      Provider Number: [id number]  Attending Physician Name and Address:  Default, Provider, MD  Relative Name and Phone Number:  Dan Maker [phone]    Current Level of Care: Hospital Recommended Level of Care: Skilled Nursing Facility Prior Approval Number:    Date Approved/Denied:   PASRR Number: Under Review  Discharge Plan: SNF    Current Diagnoses: Patient Active Problem List   Diagnosis Date Noted   Multiple rib fractures 02/19/2020    Orientation RESPIRATION BLADDER Height & Weight     Self  Normal Continent Weight:   Height:     BEHAVIORAL SYMPTOMS/MOOD NEUROLOGICAL BOWEL NUTRITION STATUS      Continent  (See discharge instructions)  AMBULATORY STATUS COMMUNICATION OF NEEDS Skin   Limited Assist Verbally Bruising                       Personal Care Assistance Level of Assistance  Bathing, Dressing Bathing Assistance: Maximum assistance   Dressing Assistance: Maximum assistance     Functional Limitations Info  Hearing   Hearing Info: Impaired      SPECIAL CARE FACTORS FREQUENCY  PT (By licensed PT), OT (By licensed OT)     PT Frequency: 5 x week OT Frequency: 5x a week            Contractures Contractures Info: Not present    Additional Factors Info  Code Status Code Status Info: Full code             Current Medications (02/22/2020):  This is the current hospital active medication list Current Facility-Administered Medications  Medication Dose Route Frequency Provider Last Rate Last Admin   amLODipine (NORVASC) tablet 2.5 mg  2.5 mg Oral Daily Pfeiffer, Marcy, MD   2.5 mg at 02/22/20 2841    buPROPion (WELLBUTRIN SR) 12 hr tablet 150 mg  150 mg Oral BID Arby Barrette, MD   150 mg at 02/22/20 3244   docusate sodium (COLACE) capsule 100 mg  100 mg Oral BID Arby Barrette, MD   100 mg at 02/22/20 0102   levothyroxine (SYNTHROID) tablet 88 mcg  88 mcg Oral q morning - 10a Arby Barrette, MD   88 mcg at 02/22/20 7253   mirtazapine (REMERON SOL-TAB) disintegrating tablet 30 mg  30 mg Oral QHS Pfeiffer, Lebron Conners, MD       oxyCODONE-acetaminophen (PERCOCET/ROXICET) 5-325 MG per tablet 1-2 tablet  1-2 tablet Oral Q6H PRN Arby Barrette, MD   2 tablet at 02/22/20 6644   pantoprazole (PROTONIX) EC tablet 20 mg  20 mg Oral Daily Pfeiffer, Lebron Conners, MD       polyethylene glycol (MIRALAX / GLYCOLAX) packet 17 g  17 g Oral Daily Arby Barrette, MD   17 g at 02/22/20 1231   rosuvastatin (CRESTOR) tablet 10 mg  10 mg Oral Daily Arby Barrette, MD   10 mg at 02/22/20 1231   Current Outpatient Medications  Medication Sig Dispense Refill   Acetaminophen (APAP ARTHRITIS PO) Take 2 tablets by mouth every 6 (six) hours as needed (arthritis).     ALPRAZolam (XANAX) 0.25 MG tablet Take 0.25-0.5 mg by mouth See admin instructions.  Take 1 tablet (0.25 mg) in the morning and take 2 tablet (0.50mg )at night     amLODipine (NORVASC) 2.5 MG tablet Take 2.5 mg by mouth daily.     Azelastine HCl 0.15 % SOLN Place 2 sprays into both nostrils daily.     buPROPion (WELLBUTRIN SR) 150 MG 12 hr tablet Take 150 mg by mouth 2 (two) times daily.      docusate sodium (COLACE) 50 MG capsule Take 50 mg by mouth every 8 (eight) hours as needed for mild constipation.     guaiFENesin (MUCINEX PO) Take 1 tablet by mouth every 6 (six) hours as needed (mucas).     Menthol-Camphor (ICY HOT ADVANCED PAIN RELIEF) 16-11 % CREA Apply 1 application topically at bedtime as needed (pain to shoulders and lower back).      mirtazapine (REMERON SOL-TAB) 30 MG disintegrating tablet Take 30 mg by mouth at bedtime.      Omega-3 Fatty Acids (FISH OIL PO) Take 1 capsule by mouth daily.     rosuvastatin (CRESTOR) 10 MG tablet Take 10 mg by mouth daily.     SYNTHROID 88 MCG tablet Take 88 mcg by mouth every morning.     VITAMIN D PO Take 2 capsules by mouth daily.       Discharge Medications: Please see discharge summary for a list of discharge medications.  Relevant Imaging Results:  Relevant Lab Results:   Additional Information SSN [id number]  Lockie Pares, RN

## 2020-02-22 NOTE — TOC Initial Note (Signed)
Transition of Care [facility]) - Initial/Assessment Note    Patient Details  Name: Lisa Murray MRN: [id number] Date of Birth: 19-Oct-1930  Transition of Care [facility]) CM/SW Contact:    Lockie Pares, RN Phone Number: 02/22/2020, 10:21 AM  Clinical Narrative:                 Patient just discharged from the hospital , came back to the ED stating she could not take care of herself. In the hospital, the patient was assesed by PT and was recommended HH and rolling walker, which were obtained for her. She stated that she did not have 24 hour care, but church members would assist her. [facility] was consulted and agreed to see the patient, as she had been active with them previously. She is now requesting SNF placement./ rehabi. She was sent by PCP MD. To the ED.     Barriers to Discharge: Insurance Authorization   Patient Goals and CMS Choice        Expected Discharge Plan and Services    SNF versus home with home health. PT re-consulted.                                             Prior Living Arrangements/Services                       Activities of Daily Living      Permission Sought/Granted                  Emotional Assessment              Admission diagnosis:  Broken Rib/Pain Patient Active Problem List   Diagnosis Date Noted  . Multiple rib fractures 02/19/2020   PCP:  Mila Palmer, MD Pharmacy:   [phone] - [place], [place] - [address] AT [address] OF [facility] [address] [place] [address] Phone: [phone] Fax: [phone]     Social Determinants of Health (SDOH) Interventions    Readmission Risk Interventions No flowsheet data found.

## 2020-02-22 NOTE — ED Notes (Signed)
Spoke to pt nephew, Trey Paula, per pt request and provided update. This RN stated pt was walking with a steady gait to restroom. Stand by assist for safety. Pain is being managed. The goal for pt, per jeff, is to [facility] rehab facility for at least a week to receive PT and pain management. Trey Paula explained at home pt was in such pain, pt could not get up on own and could not support owns weight. Trey Paula feels she is not safe at home by herself at this time.  Trey Paula will come by and would like to speak to Dr. Managing care at this point.

## 2020-02-22 NOTE — Progress Notes (Signed)
Occupational Therapy Evaluation Patient Details Name: Lisa Murray MRN: [id number] DOB: 08-03-1930 Today's Date: 02/22/2020    History of Present Illness Patient is a 86 y/o female who presents with pain, weakness and inability to care for self at home after recent d/c on 9/3. PMH includes recent admission 9/2-9/3 after fall with rib fxs, osteoporosis, hypothyroidism.   Clinical Impression   Pt admitted with above. She demonstrates the below listed deficits and will benefit from continued OT to maximize safety and independence with BADLs.  Pt presents to OT with decreased activity tolerance, generalized weakness, impaired balance, and impaired cognition. She currently requires min A for ADL due to pain and balance impairment.  She is oriented to self and situation.  She lives alone with intermittent assist, and discharge home yesterday.  She does not demonstrate adequate endurance to manage herself over a 24 hour period of time without support/assist, therefore, recommend SNF.  I don't see a history of cognitive deficit in her chart, if this persists, transition to ALF after SNF stay be be safest option if family can't provide increased assist/support.  Will follow acutely.       Follow Up Recommendations  SNF;Supervision/Assistance - 24 hour    Equipment Recommendations  None recommended by OT    Recommendations for Other Services       Precautions / Restrictions Precautions Precautions: Fall Restrictions Weight Bearing Restrictions: No      Mobility Bed Mobility Overal bed mobility: Needs Assistance Bed Mobility: Rolling;Sidelying to Sit;Sit to Supine Rolling: Min guard Sidelying to sit: Min guard Supine to sit: Min guard     General bed mobility comments: HOB flat, increased time, no assist needed. Good demo of log roll technique.  Transfers Overall transfer level: Needs assistance Equipment used: None Transfers: Sit to/from [employer] Sit to Stand:  Min assist Stand pivot transfers: Min assist       General transfer comment: Assist to power to standing with multiple attempts from EOB.    Balance Overall balance assessment: Needs assistance Sitting-balance support: Feet supported;No upper extremity supported Sitting balance-Leahy Scale: Good Sitting balance - Comments: Able to donn shoe sitting EOB without LOB.   Standing balance support: During functional activity Standing balance-Leahy Scale: Poor Standing balance comment: reliant on external support for dynamic tasks.                           ADL either performed or assessed with clinical judgement   ADL Overall ADL's : Needs assistance/impaired Eating/Feeding: Set up;Sitting   Grooming: Wash/dry hands;Wash/dry face;Oral care;Min guard;Standing   Upper Body Bathing: Minimal assistance;Sitting   Lower Body Bathing: Minimal assistance;Sit to/from stand   Upper Body Dressing : Minimal assistance;Sitting   Lower Body Dressing: Minimal assistance;Sit to/from stand   Toilet Transfer: Minimal assistance;Ambulation;Comfort height toilet;BSC;Grab bars   Toileting- Clothing Manipulation and Hygiene: Minimal assistance;Sit to/from stand       Functional mobility during ADLs: Minimal assistance General ADL Comments: assist due to pain and balance      Vision Patient Visual Report: No change from baseline       Perception     Praxis      Pertinent Vitals/Pain Pain Assessment: 0-10 Pain Score: 5  Pain Location: left ribs Pain Descriptors / Indicators: Sore Pain Intervention(s): Monitored during session;Repositioned;Limited activity within patient's tolerance     Hand Dominance Right   Extremity/Trunk Assessment Upper Extremity Assessment Upper Extremity Assessment: Generalized weakness  Lower Extremity Assessment Lower Extremity Assessment: Defer to PT evaluation   Cervical / Trunk Assessment Cervical / Trunk Assessment: Kyphotic    Communication Communication Communication: HOH   Cognition Arousal/Alertness: Awake/alert Behavior During Therapy: WFL for tasks assessed/performed Overall Cognitive Status: No family/caregiver present to determine baseline cognitive functioning Area of Impairment: Orientation;Memory;Safety/judgement;Awareness                 Orientation Level: Disoriented to;Time;Place   Memory: Decreased short-term memory   Safety/Judgement: Decreased awareness of safety Awareness: Intellectual   General Comments: Pt tangential with speech, not answering questions appropriately. Confused about recent timeline of events. Thinks she is at home despite being told she is in the hospital. Poor awareness of safety, impaired memory.   General Comments  VSS on RA     Exercises     Shoulder Instructions      Home Living Family/patient expects to be discharged to:: Skilled nursing facility                                 Additional Comments: Pt was living alone prior to admission       Prior Functioning/Environment Level of Independence: Independent        Comments: Pt was independent prior to recent admission         OT Problem List: Decreased strength;Decreased activity tolerance;Impaired balance (sitting and/or standing);Decreased knowledge of use of DME or AE;Pain      OT Treatment/Interventions: Self-care/ADL training;Therapeutic exercise;DME and/or AE instruction;Therapeutic activities;Balance training    OT Goals(Current goals can be found in the care plan section) Acute Rehab OT Goals Patient Stated Goal: take care of myself OT Goal Formulation: With patient Time For Goal Achievement: 03/07/20 Potential to Achieve Goals: Good ADL Goals Pt Will Perform Grooming: with supervision;standing Pt Will Perform Lower Body Bathing: with supervision;sit to/from stand Pt Will Perform Lower Body Dressing: with supervision;sit to/from stand Pt Will Transfer to Toilet:  with supervision;ambulating;regular height toilet;grab bars Pt Will Perform Toileting - Clothing Manipulation and hygiene: with supervision;sit to/from stand  OT Frequency: Min 2X/week   Barriers to D/C: Decreased caregiver support          Co-evaluation              AM-PAC OT "6 Clicks" Daily Activity     Outcome Measure Help from another person eating meals?: A Little Help from another person taking care of personal grooming?: A Little Help from another person toileting, which includes using toliet, bedpan, or urinal?: A Little Help from another person bathing (including washing, rinsing, drying)?: A Little Help from another person to put on and taking off regular upper body clothing?: A Little Help from another person to put on and taking off regular lower body clothing?: A Little 6 Click Score: 18   End of Session Nurse Communication: Mobility status  Activity Tolerance: No increased pain;Patient limited by fatigue Patient left: in bed  OT Visit Diagnosis: Unsteadiness on feet (R26.81);Cognitive communication deficit (R41.841);Pain Pain - Right/Left: Left Pain - part of body:  (ribs )                Time: 2774-1287 OT Time Calculation (min): 16 min Charges:  OT General Charges $OT Visit: 1 Visit OT Evaluation $OT Eval Moderate Complexity: 1 Mod  Eber Jones., OTR/L Acute Rehabilitation Services Pager [phone] Office [phone]   Jeani Hawking M 02/22/2020, 4:51 PM

## 2020-02-22 NOTE — TOC Progression Note (Signed)
Transition of Care [facility]) - Progression Note    Patient Details  Name: Lisa Murray MRN: [id number] Date of Birth: Apr 24, 1931  Transition of Care [facility]) CM/SW Contact  Lockie Pares, RN Phone Number: 02/22/2020, 3:59 PM  Clinical Narrative:     Sherron Monday to Carolin Sicks who is at bedside. Talked about patient potentially going home and following with Home health that had already been set up with [facility].Marland Kitchen He stated he would be looking for people to check in on her if the case was that she would be going home. I stated that PT still has to evaluate her, however she has been ambulating to the bathroom without difficultly after pain medication, and likely will not qualify for SNF/Rehab.Marland Kitchen  He mentioned we would reconvene tomorrow, I stated that PT is here to evaluate, and if they feel she is able to go home then we will be discharging her today. He stated he had to go to try to make arrangements with other family, church , and friends to assist in checking in on her. I told him I would call him with update or discharge.    Barriers to Discharge: English as a second language teacher  Expected Discharge Plan and Services                                                 Social Determinants of Health (SDOH) Interventions    Readmission Risk Interventions No flowsheet data found.

## 2020-02-22 NOTE — ED Notes (Signed)
Pt sat up and ate dinner

## 2020-02-22 NOTE — ED Provider Notes (Signed)
[facility] EMERGENCY DEPARTMENT Provider Note   CSN: [id number] Arrival date & time: 02/21/20  1703     History Chief Complaint  Patient presents with  . SNF/Rehab placement    Lisa Murray is a 86 y.o. female.  HPI She was discharged from the hospital [phone] for left rib fractures.  She had fallen in her home and struck her chest wall on a metal magazine rack.  Patient had fractures of 10th 11th and 12th ribs.  She was admitted for pain control and discharged home.  Patient reports she thought that she could manage at home but she cannot.  She reports her pain is severe and she is unable to do any of her necessary activities.  Reports she feels extremely weak and is having trouble walking and requiring full assistance from family members.  She denies feeling more short of breath but is having a lot of pain with deep inspiration or any movement to try to care for herself.  She reports her niece came to pick her up and she practically had to be carried to be able to get into the car.  Patient has that she has been taking Colace but she has not had a bowel movement in 4 days.  She thinks she is getting constipated.    History reviewed. No pertinent past medical history.  Patient Active Problem List   Diagnosis Date Noted  . Multiple rib fractures 02/19/2020    Past Surgical History:  Procedure Laterality Date  . ABDOMINAL HYSTERECTOMY    . APPENDECTOMY    . BLADDER REPAIR       OB History   No obstetric history on file.     No family history on file.  Social History   Tobacco Use  . Smoking status: Never Smoker  . Smokeless tobacco: Never Used  Substance Use Topics  . Alcohol use: Never  . Drug use: Not on file    Home Medications Prior to Admission medications   Medication Sig Start Date End Date Taking? Authorizing Provider  Acetaminophen (APAP ARTHRITIS PO) Take 2 tablets by mouth every 6 (six) hours as needed (arthritis).    [provider]  ALPRAZolam Prudy Feeler) 0.25 MG tablet Take 0.25-0.5 mg by mouth See admin instructions. Take 1 tablet (0.25 mg) in the morning and take 2 tablet (0.50mg )at night 01/27/20   [provider]  amLODipine (NORVASC) 2.5 MG tablet Take 2.5 mg by mouth daily. 11/24/19   [provider]  Azelastine HCl 0.15 % SOLN Place 2 sprays into both nostrils daily. 01/14/20   [provider]  buPROPion (WELLBUTRIN SR) 150 MG 12 hr tablet Take 150 mg by mouth 2 (two) times daily.  01/13/20   [provider]  docusate sodium (COLACE) 50 MG capsule Take 50 mg by mouth every 8 (eight) hours as needed for mild constipation.    [provider]  guaiFENesin (MUCINEX PO) Take 1 tablet by mouth every 6 (six) hours as needed (mucas).    [provider]  Menthol-Camphor (ICY HOT ADVANCED PAIN RELIEF) 16-11 % CREA Apply 1 application topically at bedtime as needed (pain to shoulders and lower back).     [provider]  mirtazapine (REMERON SOL-TAB) 30 MG disintegrating tablet Take 30 mg by mouth at bedtime. 12/23/19   [provider]  Omega-3 Fatty Acids (FISH OIL PO) Take 1 capsule by mouth daily.    [provider]  rosuvastatin (CRESTOR) 10 MG tablet  Take 10 mg by mouth daily. 12/23/19   [provider]  SYNTHROID 88 MCG tablet Take 88 mcg by mouth every morning. 11/24/19   [provider]  VITAMIN D PO Take 2 capsules by mouth daily.    [provider]    Allergies    Patient has no known allergies.  Review of Systems   Review of Systems 10 systems reviewed and negative except as per HPI Physical Exam Updated Vital Signs BP 121/80   Pulse 87   Temp 97.6 F (36.4 C)   Resp 20   SpO2 98%   Physical Exam Constitutional:      Comments: Alert with clear mental status.  No respiratory distress at rest.  Frail appearance  HENT:     Head: Normocephalic and atraumatic.     Mouth/Throat:     Mouth: Mucous  membranes are moist.     Pharynx: Oropharynx is clear.  Eyes:     Extraocular Movements: Extraocular movements intact.  Cardiovascular:     Rate and Rhythm: Normal rate.     Comments: Heart regular with frequent ectopy.  No gross rub murmur gallop. Pulmonary:     Comments: No respiratory distress.  Breath sounds grossly clear but diminished at left base.  Severe pain to palpation left lateral chest wall.  Ecchymotic bruising over left lower chest wall laterally to posterior thorax. Abdominal:     General: There is no distension.     Palpations: Abdomen is soft.     Tenderness: There is no abdominal tenderness. There is no guarding.  Musculoskeletal:        General: No swelling or tenderness. Normal range of motion.     Right lower leg: No edema.     Left lower leg: No edema.  Skin:    General: Skin is warm and dry.  Neurological:     General: No focal deficit present.     Mental Status: She is oriented to person, place, and time.     Coordination: Coordination normal.  Psychiatric:        Mood and Affect: Mood normal.     ED Results / Procedures / Treatments   Labs (all labs ordered are listed, but only abnormal results are displayed) Labs Reviewed  BASIC METABOLIC PANEL - Abnormal; Notable for the following components:      Result Value   Glucose, Bld 105 (*)    GFR calc non Af Amer 58 (*)    All other components within normal limits  CBC - Abnormal; Notable for the following components:   WBC 12.5 (*)    All other components within normal limits  URINALYSIS, ROUTINE W REFLEX MICROSCOPIC - Abnormal; Notable for the following components:   Hgb urine dipstick MODERATE (*)    Ketones, ur 5 (*)    Leukocytes,Ua SMALL (*)    Bacteria, UA RARE (*)    All other components within normal limits  SARS CORONAVIRUS 2 BY RT PCR (HOSPITAL ORDER, PERFORMED IN [facility] LAB)    EKG None  Radiology DG Chest 2 View  Result Date: 02/22/2020 CLINICAL DATA:  Left rib  pain. EXAM: CHEST - 2 VIEW COMPARISON:  February 19, 2020.  February 20, 2020. FINDINGS: The heart size and mediastinal contours are within normal limits. No pneumothorax is noted. Right lung is clear. Stable elevated left hemidiaphragm is noted with mild left basilar subsegmental atelectasis. The visualized skeletal structures are unremarkable. IMPRESSION: Stable elevated left hemidiaphragm with mild left  basilar subsegmental atelectasis. Electronically Signed   By: Lupita Raider M.D.   On: 02/22/2020 08:30    Procedures Procedures (including critical care time)  Medications Ordered in ED Medications  amLODipine (NORVASC) tablet 2.5 mg (2.5 mg Oral Given 02/22/20 0924)  buPROPion (WELLBUTRIN SR) 12 hr tablet 150 mg (150 mg Oral Given 02/22/20 0922)  mirtazapine (REMERON SOL-TAB) disintegrating tablet 30 mg (has no administration in time range)  rosuvastatin (CRESTOR) tablet 10 mg (10 mg Oral Given 02/22/20 1231)  levothyroxine (SYNTHROID) tablet 88 mcg (88 mcg Oral Given 02/22/20 0925)  oxyCODONE-acetaminophen (PERCOCET/ROXICET) 5-325 MG per tablet 1-2 tablet (2 tablets Oral Given 02/22/20 0923)  pantoprazole (PROTONIX) EC tablet 20 mg (20 mg Oral Not Given 02/22/20 1403)  docusate sodium (COLACE) capsule 100 mg (100 mg Oral Given 02/22/20 0923)  polyethylene glycol (MIRALAX / GLYCOLAX) packet 17 g (17 g Oral Given 02/22/20 1231)    ED Course  I have reviewed the triage vital signs and the nursing notes.  Pertinent labs & imaging results that were available during my care of the patient were reviewed by me and considered in my medical decision making (see chart for details).    MDM Rules/Calculators/A&P                         Patient presents with failure to manage ADLs at home after rib fracture.  She reports she is too weak to even ambulate independently and has significant pain associated with any movement or breathing.  Patient is frail in appearance.  Chest x-ray does not show acute pneumonia or  pneumothorax.  At this time symptoms appear predominantly due to pain associated with acute injury.  Patient will be evaluated for rehab placement.  Social work and PT OT consulted.  I have had extensive conversation with the patient's nephew.  He will come to spend some time with the patient.  Final disposition will depend on patient's evaluation by PT\OT and case management.  Patient has gotten good pain relief with Percocet.  She has tolerated it well without confusion or somnolence.  Would be appropriate to continue 1-2 Percocet every 6 hours.  Patient also reports she is developed some constipation.  Would also continue Colace 100 mg twice daily and MiraLAX 17 g packet daily as needed. Final Clinical Impression(s) / ED Diagnoses Final diagnoses:  Impaired mobility and ADLs  Closed fracture of multiple ribs of left side with routine healing, subsequent encounter    Rx / DC Orders ED Discharge Orders    None       Arby Barrette, MD 02/22/20 1533

## 2020-02-22 NOTE — TOC Progression Note (Signed)
Transition of Care [facility]) - Progression Note    Patient Details  Name: Lisa Murray MRN: [id number] Date of Birth: 04-13-1931  Transition of Care [facility] CM/SW Contact  Lockie Pares, RN Phone Number: 02/22/2020, 4:55 PM  Clinical Narrative:      Screening has been submitted and your MUST ID for reference is [id number]. PASSAR level 2 review due to depression and medication taken for depression    Barriers to Discharge: Insurance Authorization  Expected Discharge Plan and Services                                                 Social Determinants of Health (SDOH) Interventions    Readmission Risk Interventions No flowsheet data found.

## 2020-02-22 NOTE — ED Notes (Signed)
Pt eating lunch

## 2020-02-22 NOTE — ED Notes (Signed)
Pt ambulated to restroom unassisted. Pt got dressed into personal clothes. When pt got back into bed, she stated I wasn't sure what kind of floor I would be in or how to be dressed.

## 2020-02-22 NOTE — Evaluation (Signed)
Physical Therapy Evaluation Patient Details Name: Lisa Murray MRN: [id number] DOB: [birth year] Today's Date: 02/22/2020   History of Present Illness  Patient is a 86 y/o female who presents with pain, weakness and inability to care for self at home after recent d/c on 9/3. PMH includes recent admission 9/2-9/3 after fall with rib fxs, osteoporosis, hypothyroidism.  Clinical Impression  Patient presents with pain, generalized weakness, impaired balance, impaired cognition and impaired mobility s/p above. Pt lives at home alone and reports difficulty mobilizing and caring for self since being d/ced home on 9/3. Pt with cognitive deficits relating to orientation, memory, awareness and safety. Requires Min A for transfers and short distance ambulation due to weakness and balance deficits. Pt is a high fall risk and not safe to be home alone at this time. Would benefit from SNF to maximize independence and mobility prior to return home. Will follow acutely.    Follow Up Recommendations SNF;Supervision for mobility/OOB    Equipment Recommendations  None recommended by PT    Recommendations for Other Services       Precautions / Restrictions Precautions Precautions: Fall Restrictions Weight Bearing Restrictions: No      Mobility  Bed Mobility Overal bed mobility: Needs Assistance Bed Mobility: Rolling;Sidelying to Sit;Sit to Supine Rolling: Min guard Sidelying to sit: Min guard Supine to sit: Min guard     General bed mobility comments: HOB flat, increased time, no assist needed. Good demo of log roll technique.  Transfers Overall transfer level: Needs assistance Equipment used: None Transfers: Sit to/from Stand Sit to Stand: Min assist         General transfer comment: Assist to power to standing with multiple attempts from EOB.  Ambulation/Gait Ambulation/Gait assistance: Min assist Gait Distance (Feet): 25 Feet Assistive device: None;1 person hand held assist Gait  Pattern/deviations: Step-through pattern;Decreased stride length;Narrow base of support;Trunk flexed;Drifts right/left Gait velocity: decreased Gait velocity interpretation: <1.8 ft/sec, indicate of risk for recurrent falls General Gait Details: Slow, unsteady gait with Min A for balance; limited distance due to weakness/pain.  Stairs            Wheelchair Mobility    Modified Rankin (Stroke Patients Only)       Balance Overall balance assessment: Needs assistance Sitting-balance support: Feet supported;No upper extremity supported Sitting balance-Leahy Scale: Good Sitting balance - Comments: Able to donn shoe sitting EOB without LOB.   Standing balance support: During functional activity Standing balance-Leahy Scale: Fair Standing balance comment: reliant on external support for dynamic tasks.                             Pertinent Vitals/Pain Pain Assessment: 0-10 Pain Score: 5  Pain Location: left ribs Pain Descriptors / Indicators: Sore Pain Intervention(s): Monitored during session;Repositioned    Home Living Family/patient expects to be discharged to:: Skilled nursing facility                      Prior Function                 Hand Dominance        Extremity/Trunk Assessment   Upper Extremity Assessment Upper Extremity Assessment: Defer to OT evaluation    Lower Extremity Assessment Lower Extremity Assessment: Generalized weakness    Cervical / Trunk Assessment Cervical / Trunk Assessment: Kyphotic  Communication      Cognition Arousal/Alertness: Awake/alert Behavior During Therapy: [facility] for tasks assessed/performed  Overall Cognitive Status: Impaired/Different from baseline Area of Impairment: Orientation;Memory;Safety/judgement;Awareness                 Orientation Level: Disoriented to;Time;Place   Memory: Decreased short-term memory   Safety/Judgement: Decreased awareness of safety Awareness:  Intellectual   General Comments: Pt tangential with speech, not answering questions appropriately. Confused about recent timeline of events. Thinks she is at home despite being told she is in the hospital. Poor awareness of safety, impaired memory.      General Comments General comments (skin integrity, edema, etc.): VSS on RA. Reports subjective SOB with mask donned.    Exercises     Assessment/Plan    PT Assessment Patient needs continued PT services  PT Problem List Decreased strength;Decreased activity tolerance;Decreased balance;Decreased mobility;Pain;Decreased safety awareness;Decreased cognition;Cardiopulmonary status limiting activity       PT Treatment Interventions DME instruction;Gait training;Functional mobility training;Therapeutic activities;Therapeutic exercise;Balance training;Patient/family education;Stair training;Cognitive remediation    PT Goals (Current goals can be found in the Care Plan section)  Acute Rehab PT Goals Patient Stated Goal: take care of myself PT Goal Formulation: With patient Time For Goal Achievement: 03/07/20 Potential to Achieve Goals: Good    Frequency Min 2X/week   Barriers to discharge Decreased caregiver support lives alone    Co-evaluation               AM-PAC PT "6 Clicks" Mobility  Outcome Measure Help needed turning from your back to your side while in a flat bed without using bedrails?: None Help needed moving from lying on your back to sitting on the side of a flat bed without using bedrails?: None Help needed moving to and from a bed to a chair (including a wheelchair)?: A Little Help needed standing up from a chair using your arms (e.g., wheelchair or bedside chair)?: A Little Help needed to walk in hospital room?: A Little Help needed climbing 3-5 steps with a railing? : A Lot 6 Click Score: 19    End of Session   Activity Tolerance: Patient tolerated treatment well;Patient limited by fatigue Patient left: in  bed;with call bell/phone within reach;with bed alarm set Nurse Communication: Mobility status PT Visit Diagnosis: Unsteadiness on feet (R26.81);History of falling (Z91.81);Difficulty in walking, not elsewhere classified (R26.2);Pain Pain - Right/Left: Left Pain - part of body:  (ribs)    Time: 6226-3335 PT Time Calculation (min) (ACUTE ONLY): 17 min   Charges:   PT Evaluation $PT Eval Moderate Complexity: 1 Mod          Vale Haven, PT, DPT Acute Rehabilitation Services Pager [phone] Office [phone]      Blake Divine A Lanier Ensign 02/22/2020, 4:23 PM

## 2020-02-22 NOTE — ED Notes (Signed)
Case management at bedside.

## 2020-02-23 DIAGNOSIS — S2242XA Multiple fractures of ribs, left side, initial encounter for closed fracture: Secondary | ICD-10-CM | POA: Diagnosis not present

## 2020-02-23 NOTE — ED Notes (Signed)
Pt sleeping, chest rising and falling, pt in NAD 

## 2020-02-23 NOTE — ED Notes (Signed)
Nephew at bedside to visit for few minutes

## 2020-02-23 NOTE — ED Notes (Addendum)
Pt resting, calm and cooperative, chest rising and falling, pt in NAD

## 2020-02-23 NOTE — ED Notes (Addendum)
Patient is resting comfortably; no needs

## 2020-02-23 NOTE — ED Notes (Signed)
Patient is resting comfortably. 

## 2020-02-23 NOTE — ED Notes (Signed)
Pt provided with breakfast tray.

## 2020-02-23 NOTE — ED Notes (Signed)
Pt provided lunch tray.

## 2020-02-23 NOTE — ED Notes (Signed)
Patient is resting comfortably. No needs at this time.

## 2020-02-23 NOTE — ED Notes (Signed)
Pt ambulates to restroom with walker. Pt was given bath at bedside- clothing changed into gown and socks, oral care provided. Pt verbalized appreciation. States she did not realize how weak and SOB she had gotten.

## 2020-02-23 NOTE — ED Notes (Signed)
Pt updated on Plan- RN asked her if she wanted to choose Rehab facility to go to and she voiced that she would like nephew to choose. RN called Nephew and he chose [facility]. Okey Regal, SW, made aware.

## 2020-02-23 NOTE — Progress Notes (Addendum)
CSW spoke with patient to present her with bed offers - Lanora Manis, RN spoke with patient's nephew to discuss offers as well. Both patient and nephew agree to accept the bed offer from [facility].  CSW spoke with Lowella Bandy at [facility] to inform her of the bed acceptance.  CSW initated insurance authorization with [employer].  Edwin Dada, MSW, LCSW-A Transitions of Care  Clinical Social Worker  [facility] Emergency Departments  Medical ICU [phone]

## 2020-02-24 DIAGNOSIS — J302 Other seasonal allergic rhinitis: Secondary | ICD-10-CM | POA: Diagnosis not present

## 2020-02-24 DIAGNOSIS — M6281 Muscle weakness (generalized): Secondary | ICD-10-CM | POA: Diagnosis not present

## 2020-02-24 DIAGNOSIS — R52 Pain, unspecified: Secondary | ICD-10-CM | POA: Diagnosis not present

## 2020-02-24 DIAGNOSIS — E441 Mild protein-calorie malnutrition: Secondary | ICD-10-CM | POA: Diagnosis not present

## 2020-02-24 DIAGNOSIS — E039 Hypothyroidism, unspecified: Secondary | ICD-10-CM | POA: Diagnosis not present

## 2020-02-24 DIAGNOSIS — F331 Major depressive disorder, recurrent, moderate: Secondary | ICD-10-CM | POA: Diagnosis not present

## 2020-02-24 DIAGNOSIS — K5901 Slow transit constipation: Secondary | ICD-10-CM | POA: Diagnosis not present

## 2020-02-24 DIAGNOSIS — R2681 Unsteadiness on feet: Secondary | ICD-10-CM | POA: Diagnosis not present

## 2020-02-24 DIAGNOSIS — Z789 Other specified health status: Secondary | ICD-10-CM | POA: Diagnosis not present

## 2020-02-24 DIAGNOSIS — Z7409 Other reduced mobility: Secondary | ICD-10-CM | POA: Diagnosis not present

## 2020-02-24 DIAGNOSIS — M810 Age-related osteoporosis without current pathological fracture: Secondary | ICD-10-CM | POA: Diagnosis not present

## 2020-02-24 DIAGNOSIS — R42 Dizziness and giddiness: Secondary | ICD-10-CM | POA: Diagnosis not present

## 2020-02-24 DIAGNOSIS — R0781 Pleurodynia: Secondary | ICD-10-CM | POA: Diagnosis not present

## 2020-02-24 DIAGNOSIS — S2242XA Multiple fractures of ribs, left side, initial encounter for closed fracture: Secondary | ICD-10-CM | POA: Diagnosis not present

## 2020-02-24 DIAGNOSIS — E559 Vitamin D deficiency, unspecified: Secondary | ICD-10-CM | POA: Diagnosis not present

## 2020-02-24 DIAGNOSIS — Z7401 Bed confinement status: Secondary | ICD-10-CM | POA: Diagnosis not present

## 2020-02-24 DIAGNOSIS — R41841 Cognitive communication deficit: Secondary | ICD-10-CM | POA: Diagnosis not present

## 2020-02-24 DIAGNOSIS — M2550 Pain in unspecified joint: Secondary | ICD-10-CM | POA: Diagnosis not present

## 2020-02-24 DIAGNOSIS — E119 Type 2 diabetes mellitus without complications: Secondary | ICD-10-CM | POA: Diagnosis not present

## 2020-02-24 DIAGNOSIS — R488 Other symbolic dysfunctions: Secondary | ICD-10-CM | POA: Diagnosis not present

## 2020-02-24 DIAGNOSIS — E785 Hyperlipidemia, unspecified: Secondary | ICD-10-CM | POA: Diagnosis not present

## 2020-02-24 DIAGNOSIS — N183 Chronic kidney disease, stage 3 unspecified: Secondary | ICD-10-CM | POA: Diagnosis not present

## 2020-02-24 DIAGNOSIS — F411 Generalized anxiety disorder: Secondary | ICD-10-CM | POA: Diagnosis not present

## 2020-02-24 DIAGNOSIS — M1712 Unilateral primary osteoarthritis, left knee: Secondary | ICD-10-CM | POA: Diagnosis not present

## 2020-02-24 DIAGNOSIS — R0602 Shortness of breath: Secondary | ICD-10-CM | POA: Diagnosis not present

## 2020-02-24 DIAGNOSIS — I129 Hypertensive chronic kidney disease with stage 1 through stage 4 chronic kidney disease, or unspecified chronic kidney disease: Secondary | ICD-10-CM | POA: Diagnosis not present

## 2020-02-24 DIAGNOSIS — I10 Essential (primary) hypertension: Secondary | ICD-10-CM | POA: Diagnosis not present

## 2020-02-24 DIAGNOSIS — S2242XD Multiple fractures of ribs, left side, subsequent encounter for fracture with routine healing: Secondary | ICD-10-CM | POA: Diagnosis not present

## 2020-02-24 DIAGNOSIS — Z9181 History of falling: Secondary | ICD-10-CM | POA: Diagnosis not present

## 2020-02-24 DIAGNOSIS — R296 Repeated falls: Secondary | ICD-10-CM | POA: Diagnosis not present

## 2020-02-24 MED ORDER — POLYETHYLENE GLYCOL 3350 17 G PO PACK: 17.0000 g | PACK | Freq: Every day | ORAL | 0 refills | Status: AC | PRN

## 2020-02-24 MED ORDER — DOCUSATE SODIUM 100 MG PO CAPS: 100.0000 mg | ORAL_CAPSULE | Freq: Two times a day (BID) | ORAL | 0 refills | Status: AC

## 2020-02-24 NOTE — Discharge Instructions (Signed)
Take Colace 100 mg twice a day and you can take MiraLAX as needed. Continue your other home medications as previously prescribed. Return to the ER if you start to experience abdominal pain, chest pain, shortness of breath, injuries or falls.

## 2020-02-24 NOTE — ED Provider Notes (Signed)
Emergency Medicine Observation Re-evaluation Note  Lisa Murray is a 86 y.o. female, seen on rounds today.  Pt initially presented to the ED for complaints of SNF/Rehab placement Currently, the patient is watching TV, no acute distress.  Physical Exam  BP (!) 151/83   Pulse 76   Temp 97.8 F (36.6 C)   Resp 16   SpO2 99%  Physical Exam General: No acute distress Cardiac: Normal heart rate Lungs: Equal rise and fall of chest Psych: Speaking to me about her son  ED Course / MDM  EKG:    I have reviewed the labs performed to date as well as medications administered while in observation.  Recent changes in the last 24 hours include patient being transferred to [facility] for placement.  Her nephew Trey Paula will complete paperwork regarding placement and transfer.  Patient informed of plan and she is agreeable.  She is requesting someone to get her things to bring her when she gets there.  She is also stating that "my bowels do not feel regular."  Chart review shows that she was placed on Colace and MiraLAX. Will provide her with rx for this to take for the next week. Abdomen is soft here.  Plan  Current plan is for discharge to [facility]. Patient is not under full IVC at this time.   Dietrich Pates, PA-C 02/24/20 1159    Gerhard Munch, MD 02/25/20 [phone]

## 2020-02-24 NOTE — ED Notes (Signed)
ED Provider at bedside. 

## 2020-02-24 NOTE — Progress Notes (Addendum)
1pm: CSW received copy of patient's COVID vaccine card from her nephew, Trey Paula. CSW forwarded it to [place] at [facility]. Trey Paula to complete admission paperwork at 2pm today.  Patient will go to room 102 at [facility]. Patient will be transported via [facility], RN to call when ready. The number to call for report is [phone].  12pm: CSW faxed patient's AVS to [facility] for review.  10:45am: CSW received return call from [place] at [place] with information regarding the insurance authorization for this patient.  SNF # [id number] PTAR # [id number]  10am: CSW corrected patient's PASSR and obtained a number - [id number].  CSW spoke with Crystal at [facility] [employer] who states the patient's insurance authorization is under review by the Medical Director at this time.   CSW spoke with patient's nephew Trey Paula who will go to [facility] at 1pm to complete paperwork. Trey Paula to take the patient's COVID card with him to the facility.  Edwin Dada, MSW, LCSW-A Transitions of Care  Clinical Social Worker  [facility] Emergency Departments  Medical ICU [phone]

## 2020-02-25 ENCOUNTER — Other Ambulatory Visit: Payer: Self-pay | Admitting: Family

## 2020-02-25 MED ORDER — OXYCODONE-ACETAMINOPHEN 5-325 MG PO TABS: 1.0000 | ORAL_TABLET | Freq: Four times a day (QID) | ORAL | 0 refills | Status: DC | PRN

## 2020-02-25 MED ORDER — ALPRAZOLAM 0.25 MG PO TABS
0.2500 mg | ORAL_TABLET | ORAL | 0 refills | Status: DC
Start: 2020-02-25 — End: 2020-03-08

## 2020-02-25 NOTE — Progress Notes (Signed)
Nurse request Percocet 5-325 mg tablet 1-2 tablets every 6 hrs PRN script to be send to pharmacy states Hospital did not send script on discharge patient in pain for multiple left fracture ribs.Script e-scribed to pharmacy.

## 2020-02-25 NOTE — Progress Notes (Signed)
Nurse request patient's Alprazolam refilled.Script e-scribed to [facility].

## 2020-02-27 ENCOUNTER — Non-Acute Institutional Stay (SKILLED_NURSING_FACILITY): Payer: PPO | Admitting: Internal Medicine

## 2020-02-27 ENCOUNTER — Encounter: Payer: Self-pay | Admitting: Internal Medicine

## 2020-02-27 DIAGNOSIS — E039 Hypothyroidism, unspecified: Secondary | ICD-10-CM | POA: Insufficient documentation

## 2020-02-27 DIAGNOSIS — M810 Age-related osteoporosis without current pathological fracture: Secondary | ICD-10-CM

## 2020-02-27 DIAGNOSIS — Z7409 Other reduced mobility: Secondary | ICD-10-CM | POA: Diagnosis not present

## 2020-02-27 DIAGNOSIS — S2242XA Multiple fractures of ribs, left side, initial encounter for closed fracture: Secondary | ICD-10-CM | POA: Diagnosis not present

## 2020-02-27 DIAGNOSIS — R296 Repeated falls: Secondary | ICD-10-CM

## 2020-02-27 DIAGNOSIS — R42 Dizziness and giddiness: Secondary | ICD-10-CM

## 2020-02-27 DIAGNOSIS — Z789 Other specified health status: Secondary | ICD-10-CM

## 2020-02-27 DIAGNOSIS — K5901 Slow transit constipation: Secondary | ICD-10-CM

## 2020-02-27 HISTORY — DX: Hypothyroidism, unspecified: E03.9

## 2020-02-27 HISTORY — DX: Age-related osteoporosis without current pathological fracture: M81.0

## 2020-02-27 HISTORY — DX: Dizziness and giddiness: R42

## 2020-02-27 NOTE — Progress Notes (Signed)
Provider:  Gwenith Spitz. Renato Gails, D.O., C.M.D. Location:  [facility] Room Number: 102 P Place of Service:  SNF (31)  PCP: Mila Palmer, MD Patient Care Team: Mila Palmer, MD as PCP - General (Family Medicine)  Extended Emergency Contact Information Primary Emergency Contact: Maren Reamer of [place] Home Phone: [phone] Mobile Phone: [phone] Relation: Nephew  Code Status: FULL CODE Goals of Care: Advanced Directive information Advanced Directives 02/27/2020  Does Patient Have a Medical Advance Directive? No  Would patient like information on creating a medical advance directive? No - Patient declined    Chief Complaint  Patient presents with  . New Admit To SNF    New Admit     HPI: Patient is a 86 y.o. female with h/o osteoporosis, hypothyroidism, and dizziness seen today for admission to [facility] s/p hospitalization with a fall sustaining multiple rib fxs on the left 9/2-9/4 (had lived alone and fallen 3 times in 6 wks, was getting HH PT and an assistive device).  Her pain was stabilized, major injuries ruled out and she was sent home with a walker.  On 9/4, it was determined that she needed to come here due to impaired mobility and ADLs.  She was also having constipation and had just been started on colace and miralax, but appears they had not been started.  Her nephew Trey Paula appears to be her primary contact.  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  She's been on xanax.  She also was previously on meclizine for her dizziness/vertigo.    She had her moderna covid vaccines.  When seen, she did admit to considerable pain.  It appears that she was ordered some percocet when needed and Rx sent to pharmacy but no order for the medication was on file so she was only receiving tylenol.  She had declined  her therapy early this am until she could get better pain relief for the day.    Past Medical History:  Diagnosis Date  . Dizziness 02/27/2020  . Hypothyroidism 02/27/2020  . Senile osteoporosis 02/27/2020   Past Surgical History:  Procedure Laterality Date  . ABDOMINAL HYSTERECTOMY    . APPENDECTOMY    . BLADDER REPAIR      Social History   Socioeconomic History  . Marital status: Divorced    Spouse name: Not on file  . Number of children: Not on file  . Years of education: Not on file  . Highest education level: Not on file  Occupational History  . Not on file  Tobacco Use  . Smoking status: Never Smoker  . Smokeless tobacco: Never Used  Substance and Sexual Activity  . Alcohol use: Never  . Drug use: Not on file  . Sexual activity: Not on file  Other Topics Concern  . Not on file  Social History Narrative  . Not on file   Social Determinants of Health   Financial Resource Strain:   . Difficulty of Paying Living Expenses: Not on file  Food Insecurity:   . Worried About Programme researcher, broadcasting/film/video in the Last Year: Not on file  . Ran Out of Food in the Last Year: Not on file  Transportation Needs:   . Lack of Transportation (Medical): Not on file  . Lack of Transportation (Non-Medical): Not on file  Physical Activity:   .  Days of Exercise per Week: Not on file  . Minutes of Exercise per Session: Not on file  Stress:   . Feeling of Stress : Not on file  Social Connections:   . Frequency of Communication with Friends and Family: Not on file  . Frequency of Social Gatherings with Friends and Family: Not on file  . Attends Religious Services: Not on file  . Active Member of Clubs or Organizations: Not on file  . Attends Banker Meetings: Not on file  . Marital Status: Not on file    reports that she has never smoked. She has never used smokeless tobacco. She reports that she does not drink alcohol. No history on file for drug use.  Functional Status  Survey:    History reviewed. No pertinent family history.  Health Maintenance  Topic Date Due  . COVID-19 Vaccine (1) Never done  . TETANUS/TDAP  Never done  . PNA vac Low Risk Adult (1 of 2 - PCV13) Never done  . INFLUENZA VACCINE  01/18/2020  . DEXA SCAN  Completed    No Known Allergies  Outpatient Encounter Medications as of 02/27/2020  Medication Sig  . ALPRAZolam (XANAX) 0.25 MG tablet Take 1-2 tablets (0.25-0.5 mg total) by mouth See admin instructions. Take 1 tablet (0.25 mg) in the morning and take 2 tablet (0.50mg )at night  . amLODipine (NORVASC) 2.5 MG tablet Take 2.5 mg by mouth daily.  . Azelastine HCl 0.15 % SOLN Place 2 sprays into both nostrils daily.  . bisacodyl (DULCOLAX) 10 MG suppository Place 10 mg rectally as needed for moderate constipation.  Marland Kitchen buPROPion (WELLBUTRIN SR) 150 MG 12 hr tablet Take 150 mg by mouth 2 (two) times daily.   Marland Kitchen docusate sodium (COLACE) 100 MG capsule Take 1 capsule (100 mg total) by mouth 2 (two) times daily.  Marland Kitchen guaiFENesin (MUCINEX) 600 MG 12 hr tablet Take by mouth 2 (two) times daily. 1 tablet by mouth every 12 hours as needed for mucous secretions.  . Menthol, Topical Analgesic, (BIOFREEZE) 4 % GEL Apply topically.  . mirtazapine (REMERON SOL-TAB) 30 MG disintegrating tablet Take 30 mg by mouth at bedtime.  . Omega-3 Fatty Acids (FISH OIL PO) Take 1 capsule by mouth daily.  Marland Kitchen oxyCODONE-acetaminophen (PERCOCET/ROXICET) 5-325 MG tablet Take 1-2 tablets by mouth every 6 (six) hours as needed for severe pain.  . polyethylene glycol (MIRALAX) 17 g packet Take 17 g by mouth daily as needed.  . Psyllium (METAMUCIL FIBER PO) Take by mouth. Mix 1 packet with 8 oz of water, and drink daily for constipation. Hold for loose stool  . rosuvastatin (CRESTOR) 10 MG tablet Take 10 mg by mouth daily.  Marland Kitchen SYNTHROID 88 MCG tablet Take 88 mcg by mouth every morning.  Marland Kitchen VITAMIN D PO Take 2 capsules by mouth daily.  . [DISCONTINUED] Acetaminophen (APAP  ARTHRITIS PO) Take 2 tablets by mouth every 6 (six) hours as needed (arthritis).  . [DISCONTINUED] guaiFENesin (MUCINEX PO) Take 1 tablet by mouth every 6 (six) hours as needed (mucas).  . [DISCONTINUED] Menthol-Camphor (ICY HOT ADVANCED PAIN RELIEF) 16-11 % CREA Apply 1 application topically at bedtime as needed (pain to shoulders and lower back).    No facility-administered encounter medications on file as of 02/27/2020.    Review of Systems  Constitutional: Positive for malaise/fatigue. Negative for chills and fever.  HENT: Negative for congestion and sore throat.   Eyes: Negative for blurred vision.  Respiratory: Negative for cough and shortness of  breath.   Cardiovascular: Negative for chest pain, palpitations and leg swelling.  Gastrointestinal: Negative for abdominal pain, blood in stool, constipation, diarrhea and melena.  Genitourinary: Negative for dysuria.  Musculoskeletal: Positive for back pain, falls and joint pain.       Rib pain worst on left at this time, but also has bruising of shoulder and thoracic region posteriorly  Skin: Negative for itching and rash.  Neurological: Positive for weakness. Negative for dizziness and loss of consciousness.  Endo/Heme/Allergies: Bruises/bleeds easily.  Psychiatric/Behavioral: Positive for depression. The patient is nervous/anxious.     Vitals:   02/27/20 1038  BP: (!) 100/50  Pulse: 67  Temp: (!) 97.3 F (36.3 C)  Weight: 112 lb (50.8 kg)  Height: 5\' 2"  (1.575 m)   Body mass index is 20.49 kg/m. Physical Exam Vitals reviewed.  Constitutional:      General: She is not in acute distress.    Appearance: Normal appearance. She is not ill-appearing or toxic-appearing.  HENT:     Head: Normocephalic and atraumatic.     Right Ear: External ear normal.     Left Ear: External ear normal.     Nose: Nose normal.     Mouth/Throat:     Mouth: Mucous membranes are dry.     Pharynx: Oropharynx is clear.  Eyes:     Extraocular  Movements: Extraocular movements intact.     Conjunctiva/sclera: Conjunctivae normal.     Pupils: Pupils are equal, round, and reactive to light.  Cardiovascular:     Rate and Rhythm: Normal rate and regular rhythm.     Pulses: Normal pulses.     Heart sounds: Normal heart sounds.  Pulmonary:     Effort: Pulmonary effort is normal.     Breath sounds: Normal breath sounds. No wheezing, rhonchi or rales.  Abdominal:     General: Bowel sounds are normal.     Palpations: Abdomen is soft.     Tenderness: There is no abdominal tenderness. There is no guarding or rebound.  Musculoskeletal:     Cervical back: Neck supple.     Right lower leg: No edema.     Left lower leg: No edema.     Comments: ROM limited by left posterior rib pain  Skin:    General: Skin is warm and dry.     Comments: Large purple and yellow ecchymotic area on left posterior lower rib cage  Neurological:     General: No focal deficit present.     Mental Status: She is alert and oriented to person, place, and time.     Cranial Nerves: No cranial nerve deficit.     Motor: Weakness present.     Gait: Gait abnormal.     Comments: using walker  Psychiatric:        Mood and Affect: Mood normal.        Behavior: Behavior normal.     Labs reviewed: Basic Metabolic Panel: Recent Labs    02/19/20 0904 02/20/20 0216 02/22/20 0858  NA 143 142 141  K 3.7 3.9 3.7  CL 109 109 107  CO2 25 25 22   GLUCOSE 103* 104* 105*  BUN 13 10 16   CREATININE 1.05* 0.87 0.89  CALCIUM 9.8 9.1 9.6   Liver Function Tests: Recent Labs    02/19/20 0904  AST 26  ALT 18  ALKPHOS 39  BILITOT 0.7  PROT 7.3  ALBUMIN 3.6   No results for input(s): LIPASE, AMYLASE in the last 8760  hours. No results for input(s): AMMONIA in the last 8760 hours. CBC: Recent Labs    02/19/20 0904 02/20/20 0216 02/22/20 0858  WBC 11.8* 8.9 12.5*  NEUTROABS 9.1*  --   --   HGB 13.2 12.1 13.5  HCT 42.1 37.7 42.4  MCV 98.8 99.7 98.1  PLT 205 185  226   Cardiac Enzymes: No results for input(s): CKTOTAL, CKMB, CKMBINDEX, TROPONINI in the last 8760 hours. BNP: Invalid input(s): POCBNP No results found for: HGBA1C Lab Results  Component Value Date   TSH 1.881 02/19/2020   No results found for: VITAMINB12 No results found for: FOLATE No results found for: IRON, TIBC, FERRITIN  Imaging and Procedures obtained prior to SNF admission: DG Chest 2 View  Result Date: 02/22/2020 CLINICAL DATA:  Left rib pain. EXAM: CHEST - 2 VIEW COMPARISON:  February 19, 2020.  February 20, 2020. FINDINGS: The heart size and mediastinal contours are within normal limits. No pneumothorax is noted. Right lung is clear. Stable elevated left hemidiaphragm is noted with mild left basilar subsegmental atelectasis. The visualized skeletal structures are unremarkable. IMPRESSION: Stable elevated left hemidiaphragm with mild left basilar subsegmental atelectasis. Electronically Signed   By: Lupita RaiderJames  Green Jr M.D.   On: 02/22/2020 08:30    Assessment/Plan 1. Closed fracture of multiple ribs of left side, initial encounter -has done amazingly well with not breaking things considering her underlying osteoporosis up to this point -add heat for the ribs, cont tylenol and the percocet the NP ordered, but would try to keep its use for severe pain affecting therapy so she does not develop delirium from the medication -is on prolia and followed by Dr. Cleophas DunkerBassett about this--cont current regimen  2. Frequent falls -here for therapy to help with balance, strength, use of walker  3. Impaired mobility and ADLs -reason for admission for short-term rehab, was living alone and will need to be safe to return to independence before discharge  4. Full code status - Full code confirmed  5. Acquired hypothyroidism -cont current levothyroxine therapy Lab Results  Component Value Date   TSH 1.881 02/19/2020   6. Senile osteoporosis -cont prolia, vitamin D and weightbearing  exercise  7. Dizziness -not actively c/o this, had been on vertigo meds with meclizine which is not great long-term for older adults -it was stopped at the hospital, monitor her, may need therapy exercises for BPPV if that's the type she's had  8. Slow transit constipation -cont metamucil, colace bid, and has standing orders available if these are ineffective/inadequate  Obtain latest note from Dr. Paulino RilyWolters including immunizations, medical history and provide to [facility] for updating record  Family/ staff Communication: discussed with snf nurse  Labs/tests ordered:  Cbc, bmp in one week  Zamariyah Furukawa L. Locke Barrell, D.O. Geriatrics [facility] [address] [place], [place] [address] Cell Phone (Mon-Fri 8am-5pm):  [phone] On Call:  [phone] & follow prompts after 5pm & weekends Office Phone:  [phone] Office Fax:  [phone]

## 2020-03-01 DIAGNOSIS — E119 Type 2 diabetes mellitus without complications: Secondary | ICD-10-CM | POA: Diagnosis not present

## 2020-03-01 LAB — CBC AND DIFFERENTIAL
HCT: 37 (ref 36–46)
Hemoglobin: 12 (ref 12.0–16.0)
Platelets: 189 (ref 150–399)
WBC: 7.4

## 2020-03-01 LAB — BASIC METABOLIC PANEL WITH GFR
BUN: 10 (ref 4–21)
CO2: 24 — AB (ref 13–22)
Chloride: 106 (ref 99–108)
Creatinine: 0.8 (ref 0.5–1.1)
Glucose: 80
Potassium: 3.8 (ref 3.4–5.3)
Sodium: 141 (ref 137–147)

## 2020-03-01 LAB — COMPREHENSIVE METABOLIC PANEL WITH GFR
Calcium: 9.6 (ref 8.7–10.7)
GFR calc Af Amer: 74.61
GFR calc non Af Amer: 64.38

## 2020-03-01 LAB — CBC: RBC: 3.78 — AB (ref 3.87–5.11)

## 2020-03-08 ENCOUNTER — Other Ambulatory Visit: Payer: Self-pay | Admitting: Family

## 2020-03-08 DIAGNOSIS — F411 Generalized anxiety disorder: Secondary | ICD-10-CM

## 2020-03-08 MED ORDER — ALPRAZOLAM 0.25 MG PO TABS
0.2500 mg | ORAL_TABLET | ORAL | 0 refills | Status: DC
Start: 2020-03-08 — End: 2020-03-15

## 2020-03-08 NOTE — Progress Notes (Signed)
Alprazolam 0.25 mg tablet in the morning and 0.5 mg tablet at bedtime refill requested for anxiety.Script send to [facility] pharmacy # [id number] with no refills.

## 2020-03-15 ENCOUNTER — Non-Acute Institutional Stay (SKILLED_NURSING_FACILITY): Payer: PPO | Admitting: Family

## 2020-03-15 ENCOUNTER — Encounter: Payer: Self-pay | Admitting: Family

## 2020-03-15 DIAGNOSIS — S2242XD Multiple fractures of ribs, left side, subsequent encounter for fracture with routine healing: Secondary | ICD-10-CM | POA: Diagnosis not present

## 2020-03-15 DIAGNOSIS — R42 Dizziness and giddiness: Secondary | ICD-10-CM | POA: Diagnosis not present

## 2020-03-15 DIAGNOSIS — J302 Other seasonal allergic rhinitis: Secondary | ICD-10-CM | POA: Diagnosis not present

## 2020-03-15 DIAGNOSIS — R296 Repeated falls: Secondary | ICD-10-CM

## 2020-03-15 DIAGNOSIS — R2681 Unsteadiness on feet: Secondary | ICD-10-CM | POA: Diagnosis not present

## 2020-03-15 DIAGNOSIS — F331 Major depressive disorder, recurrent, moderate: Secondary | ICD-10-CM

## 2020-03-15 DIAGNOSIS — E039 Hypothyroidism, unspecified: Secondary | ICD-10-CM | POA: Diagnosis not present

## 2020-03-15 DIAGNOSIS — I10 Essential (primary) hypertension: Secondary | ICD-10-CM | POA: Diagnosis not present

## 2020-03-15 DIAGNOSIS — M810 Age-related osteoporosis without current pathological fracture: Secondary | ICD-10-CM | POA: Diagnosis not present

## 2020-03-15 DIAGNOSIS — E785 Hyperlipidemia, unspecified: Secondary | ICD-10-CM | POA: Diagnosis not present

## 2020-03-15 DIAGNOSIS — F411 Generalized anxiety disorder: Secondary | ICD-10-CM | POA: Diagnosis not present

## 2020-03-15 MED ORDER — ALPRAZOLAM 0.25 MG PO TABS: 0.2500 mg | ORAL_TABLET | ORAL | 0 refills | Status: AC

## 2020-03-15 MED ORDER — MIRTAZAPINE 30 MG PO TBDP: 30.0000 mg | ORAL_TABLET | Freq: Every day | ORAL | 0 refills | Status: AC

## 2020-03-15 MED ORDER — AZELASTINE HCL 0.15 % NA SOLN: 2.0000 | Freq: Every day | NASAL | 0 refills | Status: AC

## 2020-03-15 MED ORDER — ROSUVASTATIN CALCIUM 10 MG PO TABS: 10.0000 mg | ORAL_TABLET | Freq: Every day | ORAL | 0 refills | Status: AC

## 2020-03-15 MED ORDER — SYNTHROID 88 MCG PO TABS: 88.0000 ug | ORAL_TABLET | Freq: Every morning | ORAL | 0 refills | Status: AC

## 2020-03-15 MED ORDER — AMLODIPINE BESYLATE 2.5 MG PO TABS: 2.5000 mg | ORAL_TABLET | Freq: Every day | ORAL | 0 refills | Status: AC

## 2020-03-15 MED ORDER — BUPROPION HCL ER (SR) 150 MG PO TB12: 150.0000 mg | ORAL_TABLET | Freq: Two times a day (BID) | ORAL | 0 refills | Status: AC

## 2020-03-15 NOTE — Progress Notes (Signed)
[facility] Room Number: 102-P Place of Service:  SNF [phone])  Provider: Richarda Blade FNP-C   PCP: Mila Palmer, MD Patient Care Team: Mila Palmer, MD as PCP - General (Family Medicine)  Extended Emergency Contact Information Primary Emergency Contact: Maren Reamer of [place] Home Phone: [phone] Mobile Phone: [phone] Relation: Nephew  Code Status: Full Code  Goals of care:  Advanced Directive information Advanced Directives 03/15/2020  Does Patient Have a Medical Advance Directive? No  Would patient like information on creating a medical advance directive? No - Patient declined     No Known Allergies  Chief Complaint  Patient presents with  . Discharge Note    Discharge home 03/17/2020    HPI:  86 y.o. female seen today at [facility] for discharge home.She was here for short term rehabilitation for post hospital admission from 02/19/2020 - 03/12/2020 for fall sustaining multiple rib fractures on the left rib.Her pain stabilize and was discharged home.Prior to her hospital admission she lived alone and had had multiple fall episodes three times within 6 weeks.she was getting home Physical Therapy with assistive device.On 02/21/2020 it was determined that she needed to come to rehab due to impaired mobility and ADL's.she was also having issues with constipation had just been started on colace and Miralax which she had not taken.she has a medical history of  Hypothroidism,Dizziness/ vertigo,seniel Osteoporosis,Depression,Anxiety,Encephalitis among others.she states right hip and rib pain under controlled.    She has worked well with PT/OT now stable for discharge home.She will be discharged home with [facility] PT/OT to continue with ROM, Exercise, Gait stability and muscle strengthening. No DME required has own walker. Home health services will be arranged by facility social worker  prior to discharge. Prescription medication will be written x 1 month then patient to follow up with PCP in 1-2 weeks.she denies any acute issues this visit. Facility staff report no new concerns.   Past Medical History:  Diagnosis Date  . Dizziness 02/27/2020  . Hypothyroidism 02/27/2020  . Senile osteoporosis 02/27/2020    Past Surgical History:  Procedure Laterality Date  . ABDOMINAL HYSTERECTOMY    . APPENDECTOMY    . BLADDER REPAIR        reports that she has never smoked. She has never used smokeless tobacco. She reports that she does not drink alcohol. No history on file for drug use. Social History   Socioeconomic History  . Marital status: Divorced    Spouse name: Not on file  . Number of children: Not on file  . Years of education: Not on file  . Highest education level: Not on file  Occupational History  . Not on file  Tobacco Use  . Smoking status: Never Smoker  . Smokeless tobacco: Never Used  Substance and Sexual Activity  . Alcohol use: Never  . Drug use: Not on file  . Sexual activity: Not on file  Other Topics Concern  . Not on file  Social History Narrative  . Not on file   Social Determinants of Health   Financial Resource Strain:   . Difficulty of Paying Living Expenses: Not on file  Food Insecurity:   . Worried About Programme researcher, broadcasting/film/video in the Last Year: Not on file  . Ran Out of Food in the Last Year: Not on file  Transportation Needs:   . Lack of Transportation (Medical): Not on file  . Lack of  Transportation (Non-Medical): Not on file  Physical Activity:   . Days of Exercise per Week: Not on file  . Minutes of Exercise per Session: Not on file  Stress:   . Feeling of Stress : Not on file  Social Connections:   . Frequency of Communication with Friends and Family: Not on file  . Frequency of Social Gatherings with Friends and Family: Not on file  . Attends Religious Services: Not on file  . Active Member of Clubs or Organizations: Not on  file  . Attends Banker Meetings: Not on file  . Marital Status: Not on file  Intimate Partner Violence:   . Fear of Current or Ex-Partner: Not on file  . Emotionally Abused: Not on file  . Physically Abused: Not on file  . Sexually Abused: Not on file   No Known Allergies  Pertinent  Health Maintenance Due  Topic Date Due  . PNA vac Low Risk Adult (1 of 2 - PCV13) Never done  . INFLUENZA VACCINE  01/18/2020  . DEXA SCAN  Completed    Medications: Outpatient Encounter Medications as of 03/15/2020  Medication Sig  . acetaminophen (TYLENOL) 650 MG CR tablet Take 650 mg by mouth every 6 (six) hours as needed for pain.  Marland Kitchen ALPRAZolam (XANAX) 0.25 MG tablet Take 1-2 tablets (0.25-0.5 mg total) by mouth See admin instructions. Take 1 tablet (0.25 mg) in the morning and take 2 tablet (0.50mg )at night  . amLODipine (NORVASC) 2.5 MG tablet Take 1 tablet (2.5 mg total) by mouth daily.  . Azelastine HCl 0.15 % SOLN Place 2 sprays into both nostrils daily.  . bisacodyl (DULCOLAX) 10 MG suppository Place 10 mg rectally as needed for moderate constipation.  Marland Kitchen buPROPion (WELLBUTRIN SR) 150 MG 12 hr tablet Take 1 tablet (150 mg total) by mouth 2 (two) times daily.  Marland Kitchen docusate sodium (COLACE) 100 MG capsule Take 1 capsule (100 mg total) by mouth 2 (two) times daily.  Marland Kitchen guaiFENesin (MUCINEX) 600 MG 12 hr tablet Take by mouth 2 (two) times daily. 1 tablet by mouth every 12 hours as needed for mucous secretions.  . Magnesium Hydroxide (MILK OF MAGNESIA PO) Take 30 mLs by mouth as needed.  . Menthol, Topical Analgesic, (BIOFREEZE) 4 % GEL Apply topically.  . mirtazapine (REMERON SOL-TAB) 30 MG disintegrating tablet Take 1 tablet (30 mg total) by mouth at bedtime.  . Omega-3 Fatty Acids (FISH OIL PO) Take 1 capsule by mouth daily.  . polyethylene glycol (MIRALAX) 17 g packet Take 17 g by mouth daily as needed.  . Psyllium (METAMUCIL FIBER PO) Take by mouth. Mix 1 packet with 8 oz of water, and  drink daily for constipation. Hold for loose stool  . rosuvastatin (CRESTOR) 10 MG tablet Take 1 tablet (10 mg total) by mouth daily.  . Sodium Phosphates (RA SALINE ENEMA RE) Place rectally as needed.  Marland Kitchen SYNTHROID 88 MCG tablet Take 1 tablet (88 mcg total) by mouth every morning.  Marland Kitchen VITAMIN D PO Take 2 capsules by mouth daily.  . [DISCONTINUED] ALPRAZolam (XANAX) 0.25 MG tablet Take 1-2 tablets (0.25-0.5 mg total) by mouth See admin instructions. Take 1 tablet (0.25 mg) in the morning and take 2 tablet (0.50mg )at night  . [DISCONTINUED] amLODipine (NORVASC) 2.5 MG tablet Take 2.5 mg by mouth daily.  . [DISCONTINUED] Azelastine HCl 0.15 % SOLN Place 2 sprays into both nostrils daily.  . [DISCONTINUED] buPROPion (WELLBUTRIN SR) 150 MG 12 hr tablet Take 150 mg by  mouth 2 (two) times daily.   . [DISCONTINUED] mirtazapine (REMERON SOL-TAB) 30 MG disintegrating tablet Take 30 mg by mouth at bedtime.  . [DISCONTINUED] rosuvastatin (CRESTOR) 10 MG tablet Take 10 mg by mouth daily.  . [DISCONTINUED] SYNTHROID 88 MCG tablet Take 88 mcg by mouth every morning.  . [DISCONTINUED] oxyCODONE-acetaminophen (PERCOCET/ROXICET) 5-325 MG tablet Take 1-2 tablets by mouth every 6 (six) hours as needed for severe pain.   No facility-administered encounter medications on file as of 03/15/2020.     Review of Systems  Constitutional: Negative for activity change, appetite change, chills, fatigue and fever.  HENT: Negative for congestion, rhinorrhea, sinus pressure, sinus pain, sneezing and sore throat.   Eyes: Negative for discharge, redness and itching.  Respiratory: Negative for cough, chest tightness, shortness of breath and wheezing.   Cardiovascular: Negative for chest pain, palpitations and leg swelling.  Gastrointestinal: Negative for abdominal distention, abdominal pain, constipation, diarrhea, nausea and vomiting.  Endocrine: Negative for cold intolerance, heat intolerance, polydipsia, polyphagia and  polyuria.  Genitourinary: Negative for difficulty urinating, dysuria, flank pain, frequency and urgency.  Musculoskeletal: Positive for arthralgias and gait problem. Negative for joint swelling, myalgias and neck pain.  Skin: Negative for color change, pallor and rash.  Neurological: Negative for dizziness, speech difficulty, weakness, light-headedness, numbness and headaches.  Psychiatric/Behavioral: Negative for agitation, behavioral problems and sleep disturbance. The patient is not nervous/anxious.     Vitals:   03/15/20 1139  BP: 121/77  Pulse: 70  Resp: 18  Temp: 97.6 F (36.4 C)  Weight: 112 lb (50.8 kg)  Height: 5\' 2"  (1.575 m)   Body mass index is 20.49 kg/m. Physical Exam Vitals and nursing note reviewed.  Constitutional:      General: She is not in acute distress.    Appearance: She is normal weight. She is not ill-appearing.  HENT:     Head: Normocephalic.     Nose: Nose normal. No congestion or rhinorrhea.     Mouth/Throat:     Mouth: Mucous membranes are moist.     Pharynx: Oropharynx is clear. No oropharyngeal exudate or posterior oropharyngeal erythema.  Eyes:     General: No scleral icterus.       Right eye: No discharge.        Left eye: No discharge.     Conjunctiva/sclera: Conjunctivae normal.     Pupils: Pupils are equal, round, and reactive to light.  Cardiovascular:     Rate and Rhythm: Normal rate and regular rhythm.     Pulses: Normal pulses.     Heart sounds: Normal heart sounds. No murmur heard.  No friction rub. No gallop.   Pulmonary:     Effort: Pulmonary effort is normal. No respiratory distress.     Breath sounds: Normal breath sounds. No wheezing, rhonchi or rales.  Chest:     Chest wall: No tenderness.  Abdominal:     General: Bowel sounds are normal. There is no distension.     Palpations: Abdomen is soft. There is no mass.     Tenderness: There is no abdominal tenderness. There is no right CVA tenderness, left CVA tenderness,  guarding or rebound.  Musculoskeletal:        General: No swelling or tenderness.     Cervical back: Normal range of motion. No rigidity or tenderness.     Right lower leg: No edema.     Left lower leg: No edema.     Comments: Unsteady gait uses walker  Lymphadenopathy:     Cervical: No cervical adenopathy.  Skin:    General: Skin is warm.     Coloration: Skin is not pale.     Findings: No bruising, erythema or rash.     Comments: Right shoulder/chest large yellowish-purple bruise has improved.  Neurological:     Mental Status: She is alert and oriented to person, place, and time.     Cranial Nerves: No cranial nerve deficit.     Sensory: No sensory deficit.     Motor: No weakness.     Coordination: Coordination normal.     Gait: Gait abnormal.  Psychiatric:        Mood and Affect: Mood normal.        Behavior: Behavior normal.        Thought Content: Thought content normal.        Judgment: Judgment normal.      Labs reviewed: Basic Metabolic Panel: Recent Labs    02/19/20 0904 02/19/20 0904 02/20/20 0216 02/22/20 0858 03/01/20 0000  NA 143   < > 142 141 141  K 3.7   < > 3.9 3.7 3.8  CL 109   < > 109 107 106  CO2 25   < > 25 22 24*  GLUCOSE 103*  --  104* 105*  --   BUN 13   < > 10 16 10   CREATININE 1.05*   < > 0.87 0.89 0.8  CALCIUM 9.8   < > 9.1 9.6 9.6   < > = values in this interval not displayed.   Liver Function Tests: Recent Labs    02/19/20 0904  AST 26  ALT 18  ALKPHOS 39  BILITOT 0.7  PROT 7.3  ALBUMIN 3.6   CBC: Recent Labs    02/19/20 0904 02/19/20 0904 02/20/20 0216 02/22/20 0858 03/01/20 0000  WBC 11.8*   < > 8.9 12.5* 7.4  NEUTROABS 9.1*  --   --   --   --   HGB 13.2   < > 12.1 13.5 12.0  HCT 42.1   < > 37.7 42.4 37  MCV 98.8  --  99.7 98.1  --   PLT 205   < > 185 226 189   < > = values in this interval not displayed.   Procedures and Imaging Studies During Stay: DG Chest 2 View  Result Date: 02/22/2020 CLINICAL DATA:   Left rib pain. EXAM: CHEST - 2 VIEW COMPARISON:  February 19, 2020.  February 20, 2020. FINDINGS: The heart size and mediastinal contours are within normal limits. No pneumothorax is noted. Right lung is clear. Stable elevated left hemidiaphragm is noted with mild left basilar subsegmental atelectasis. The visualized skeletal structures are unremarkable. IMPRESSION: Stable elevated left hemidiaphragm with mild left basilar subsegmental atelectasis. Electronically Signed   By: February 22, 2020 M.D.   On: 02/22/2020 08:30   DG Chest 2 View  Result Date: 02/19/2020 CLINICAL DATA:  Fall. EXAM: CHEST - 2 VIEW COMPARISON:  January 20, 2018. FINDINGS: The heart size and mediastinal contours are within normal limits. Both lungs are clear. Stable large hiatal or left diaphragmatic hernia is noted. No pneumothorax or pleural effusion is noted. The visualized skeletal structures are unremarkable. IMPRESSION: No active cardiopulmonary disease. Stable large hiatal or diaphragmatic cardia. Electronically Signed   By: January 22, 2018 M.D.   On: 02/19/2020 09:40   DG Lumbar Spine Complete  Result Date: 02/19/2020 CLINICAL DATA:  Fall. EXAM: LUMBAR SPINE - COMPLETE  4+ VIEW COMPARISON:  January 20, 2018. FINDINGS: Severe compression deformity of L1 vertebral body is noted with moderate compression deformity of L2 vertebral body, consistent with old fractures. Mild superior endplate depression of L4 vertebral body is noted most consistent with old fracture. No definite acute fracture or spondylolisthesis is noted. Moderate levoscoliosis of lumbar spine is noted. IMPRESSION: Probable old fractures involving L1 and L2 vertebral bodies. Mild superior endplate depression of L4 vertebral body is noted most consistent with old fracture. No definite acute fracture is noted. Electronically Signed   By: Lupita Raider M.D.   On: 02/19/2020 09:42   DG Pelvis 1-2 Views  Result Date: 02/19/2020 CLINICAL DATA:  Fall. EXAM: PELVIS - 1-2 VIEW  COMPARISON:  Nov 12, 2019. FINDINGS: There is no evidence of pelvic fracture or diastasis. No pelvic bone lesions are seen. IMPRESSION: Negative. Electronically Signed   By: Lupita Raider M.D.   On: 02/19/2020 09:43   CT Head Wo Contrast  Result Date: 02/19/2020 CLINICAL DATA:  Head trauma. EXAM: CT HEAD WITHOUT CONTRAST TECHNIQUE: Contiguous axial images were obtained from the base of the skull through the vertex without intravenous contrast. COMPARISON:  None. FINDINGS: Brain: No evidence of acute large vascular territory infarction, hemorrhage, hydrocephalus, extra-axial collection or mass lesion/mass effect. Small hypodensity in the right thalamus. Mild to moderate diffuse cerebral volume loss with ex vacuo ventricular dilation. Patchy white matter hypoattenuation is nonspecific but likely the sequela of chronic microvascular ischemic disease. Vascular: Calcific intracranial atherosclerosis. No hyperdense vessel identified. Skull: Normal. Negative for fracture or focal lesion.89 Sinuses/Orbits: Mild scattered paranasal sinus mucosal thickening. Other: No mastoid effusions. See CT chest for characterization a findings seen on the SCOUT. IMPRESSION: 1. No acute traumatic intracranial abnormality. 2. Small hypodensity in the right thalamus is consistent with an age-indeterminate lacunar infarct, favored remote. MRI could further evaluate if there is concern for acute infarct. Electronically Signed   By: Feliberto Harts MD   On: 02/19/2020 10:49   CT Chest Wo Contrast  Result Date: 02/19/2020 CLINICAL DATA:  Minor chest trauma.  Left lower back pain. EXAM: CT CHEST WITHOUT CONTRAST TECHNIQUE: Multidetector CT imaging of the chest was performed following the standard protocol without IV contrast. COMPARISON:  None. FINDINGS: Cardiovascular: Normal heart size. No pericardial effusion. Aortic and coronary atherosclerosis. Mediastinum/Nodes: Large hiatal hernia containing pancreas body/tail, stomach, and  colon. No mediastinal adenopathy. Thyroidectomy. Lungs/Pleura: Mild scarring over the diaphragmatic hernia. There is no edema, consolidation, effusion, or pneumothorax. Upper Abdomen: Large hernia as noted above. No acute finding in the abdomen. Small low-density in the right liver, likely cystic. Musculoskeletal: Remote L1 and L2 compression fractures. Posterior left tenth through twelfth rib fractures with only mild displacement. Severe glenohumeral osteoarthritis on the right IMPRESSION: 1. Posterior left tenth through twelfth rib fractures with up to mild displacement. No hemothorax or pneumothorax. 2. Remote L1 and L2 compression fractures. 3. No evidence of intrathoracic injury. 4. Large diaphragmatic hernia containing colon, pancreas, and stomach. Electronically Signed   By: Marnee Spring M.D.   On: 02/19/2020 10:47   CT Cervical Spine Wo Contrast  Result Date: 02/19/2020 CLINICAL DATA:  Neck trauma.  Fell at home. EXAM: CT CERVICAL SPINE WITHOUT CONTRAST TECHNIQUE: Multidetector CT imaging of the cervical spine was performed without intravenous contrast. Multiplanar CT image reconstructions were also generated. COMPARISON:  None. FINDINGS: Alignment: Normal. Skull base and vertebrae: No acute fracture. No primary bone lesion or focal pathologic process. Diffuse osteopenia. Soft tissues and spinal  canal: No prevertebral fluid or swelling. No visible large canal hematoma. Degenerative changes: Craniocervical degenerative change. Multilevel mild-to-moderate degenerative disc disease, greatest at C5-C6. Posterior disc bulge at C4-C5 which effaces the ventral CSF and contacts the ventral cord without evidence high-grade stenosis. Upper chest: Negative. Other: Atherosclerotic vascular calcifications. IMPRESSION: 1. No evidence of acute fracture or malalignment. 2. Degenerative changes, including C4-C5 posterior disc bulge which effaces the ventral CSF without evidence of advanced canal stenosis.  Electronically Signed   By: Feliberto HartsFrederick S Jones MD   On: 02/19/2020 10:56   DG Chest Port 1 View  Result Date: 02/20/2020 CLINICAL DATA:  Fall with LEFT chest and rib pain. EXAM: PORTABLE CHEST 1 VIEW COMPARISON:  02/19/2020 FINDINGS: The cardiomediastinal silhouette is unchanged with a very large LEFT diaphragmatic hernia again noted. There is no evidence of focal airspace disease, pulmonary edema, suspicious pulmonary nodule/mass, pleural effusion, or pneumothorax. Known LEFT rib fractures are difficult to visualize on this study. IMPRESSION: 1. No evidence of acute cardiopulmonary disease. 2. Very large LEFT diaphragmatic hernia. Electronically Signed   By: Harmon PierJeffrey  Hu M.D.   On: 02/20/2020 06:55    Assessment/Plan:     1. Generalized anxiety disorder Stable.continue on Alprazolam has been on this for several years unable to wean off.  - ALPRAZolam (XANAX) 0.25 MG tablet; Take 1-2 tablets (0.25-0.5 mg total) by mouth See admin instructions. Take 1 tablet (0.25 mg) in the morning and take 2 tablet (0.50mg )at night  Dispense: 30 tablet; Refill: 0  2. Unsteady gait Has worked well with PT/ OT. Will discharge home [facility]  PT/OT to continue with ROM, Exercise, Gait stability and muscle strengthening. No DME required has own walker. Fall and safety precautions. - CBC, BMP in 1-2 weeks PCP   3. Closed fracture of multiple ribs of left side with routine healing, subsequent encounter Status post fall Pain under controlled.Breathing is stable. - CBC, BMP in 1-2 weeks PCP   4. Acquired hypothyroidism No latest TSH level for review.Will defer to PCP  - continue on Synthroid 88 mcg tablet daily   - SYNTHROID 88 MCG tablet; Take 1 tablet (88 mcg total) by mouth every morning.  Dispense: 30 tablet; Refill: 0  5. Frequent falls Had multiple falls x 3 episodes over 6 weeks  prior to recent hospital admission. No fall episode here during rehab. - [facility]  PT/OT as above.  6.  Senile osteoporosis Dexa scan on chart reviewed done 01/09/2001 indicated osteoporosis on lumbar spine T-score -4.1 and left femur Neck T-score -3.0 previously on Fosamax  and calcium supplement.  7. Dizziness Stable.  8. Moderate episode of recurrent major depressive disorder (HCC) Mood stable.continue on bupropion  - buPROPion (WELLBUTRIN SR) 150 MG 12 hr tablet; Take 1 tablet (150 mg total) by mouth 2 (two) times daily.  Dispense: 60 tablet; Refill: 0 - mirtazapine (REMERON SOL-TAB) 30 MG disintegrating tablet; Take 1 tablet (30 mg total) by mouth at bedtime.  Dispense: 30 tablet; Refill: 0  9. Hyperlipidemia LDL goal <100 No LDL for review will defer to PCP - continue on rosuvastatin  - rosuvastatin (CRESTOR) 10 MG tablet; Take 1 tablet (10 mg total) by mouth daily.  Dispense: 30 tablet; Refill: 0  10. Essential hypertension B/p stable  - continue on amlodipine. - amLODipine (NORVASC) 2.5 MG tablet; Take 1 tablet (2.5 mg total) by mouth daily.  Dispense: 30 tablet; Refill: 0  11. Seasonal allergic rhinitis, unspecified trigger Symptoms controlled on Azelastine  -  Azelastine HCl 0.15 % SOLN; Place 2 sprays into both nostrils daily.  Dispense: 30 mL; Refill: 0  Patient is being discharged with the following home health services:   -PT/OT for ROM, exercise, gait stability and muscle strengthening  Patient is being discharged with the following durable medical equipment:   No require has own walker.  Patient has been advised to f/u with their PCP in 1-2 weeks to for a transitions of care visit.Social services at their facility was responsible for arranging this appointment.  Pt was provided with adequate prescriptions of noncontrolled medications to reach the scheduled appointment.For controlled substances, a limited supply was provided as appropriate for the individual patient. If the pt normally receives these medications from a pain clinic or has a contract with another physician,  these medications should be received from that clinic or physician only).    Future labs/tests needed:  CBC, BMP in 1-2 weeks PCP

## 2020-03-23 DIAGNOSIS — S2249XA Multiple fractures of ribs, unspecified side, initial encounter for closed fracture: Secondary | ICD-10-CM | POA: Diagnosis not present

## 2020-03-23 DIAGNOSIS — F339 Major depressive disorder, recurrent, unspecified: Secondary | ICD-10-CM | POA: Diagnosis not present

## 2020-03-23 DIAGNOSIS — M8000XA Age-related osteoporosis with current pathological fracture, unspecified site, initial encounter for fracture: Secondary | ICD-10-CM | POA: Diagnosis not present

## 2020-03-23 DIAGNOSIS — Z23 Encounter for immunization: Secondary | ICD-10-CM | POA: Diagnosis not present

## 2020-03-23 DIAGNOSIS — R2689 Other abnormalities of gait and mobility: Secondary | ICD-10-CM | POA: Diagnosis not present

## 2020-03-25 DIAGNOSIS — K581 Irritable bowel syndrome with constipation: Secondary | ICD-10-CM | POA: Diagnosis not present

## 2020-03-25 DIAGNOSIS — Z9181 History of falling: Secondary | ICD-10-CM | POA: Diagnosis not present

## 2020-03-25 DIAGNOSIS — S2242XD Multiple fractures of ribs, left side, subsequent encounter for fracture with routine healing: Secondary | ICD-10-CM | POA: Diagnosis not present

## 2020-03-25 DIAGNOSIS — E039 Hypothyroidism, unspecified: Secondary | ICD-10-CM | POA: Diagnosis not present

## 2020-03-25 DIAGNOSIS — M810 Age-related osteoporosis without current pathological fracture: Secondary | ICD-10-CM | POA: Diagnosis not present

## 2020-03-25 DIAGNOSIS — I129 Hypertensive chronic kidney disease with stage 1 through stage 4 chronic kidney disease, or unspecified chronic kidney disease: Secondary | ICD-10-CM | POA: Diagnosis not present

## 2020-03-25 DIAGNOSIS — N183 Chronic kidney disease, stage 3 unspecified: Secondary | ICD-10-CM | POA: Diagnosis not present

## 2020-03-25 DIAGNOSIS — F410 Panic disorder [episodic paroxysmal anxiety] without agoraphobia: Secondary | ICD-10-CM | POA: Diagnosis not present

## 2020-03-25 DIAGNOSIS — E785 Hyperlipidemia, unspecified: Secondary | ICD-10-CM | POA: Diagnosis not present

## 2020-03-25 DIAGNOSIS — E441 Mild protein-calorie malnutrition: Secondary | ICD-10-CM | POA: Diagnosis not present

## 2020-03-25 DIAGNOSIS — Z79899 Other long term (current) drug therapy: Secondary | ICD-10-CM | POA: Diagnosis not present

## 2020-03-25 DIAGNOSIS — F324 Major depressive disorder, single episode, in partial remission: Secondary | ICD-10-CM | POA: Diagnosis not present

## 2020-03-25 DIAGNOSIS — R41841 Cognitive communication deficit: Secondary | ICD-10-CM | POA: Diagnosis not present

## 2020-03-25 DIAGNOSIS — M1712 Unilateral primary osteoarthritis, left knee: Secondary | ICD-10-CM | POA: Diagnosis not present

## 2020-03-26 DIAGNOSIS — K581 Irritable bowel syndrome with constipation: Secondary | ICD-10-CM | POA: Diagnosis not present

## 2020-03-26 DIAGNOSIS — E441 Mild protein-calorie malnutrition: Secondary | ICD-10-CM | POA: Diagnosis not present

## 2020-03-26 DIAGNOSIS — S2242XD Multiple fractures of ribs, left side, subsequent encounter for fracture with routine healing: Secondary | ICD-10-CM | POA: Diagnosis not present

## 2020-03-26 DIAGNOSIS — F410 Panic disorder [episodic paroxysmal anxiety] without agoraphobia: Secondary | ICD-10-CM | POA: Diagnosis not present

## 2020-03-26 DIAGNOSIS — M1712 Unilateral primary osteoarthritis, left knee: Secondary | ICD-10-CM | POA: Diagnosis not present

## 2020-03-26 DIAGNOSIS — N183 Chronic kidney disease, stage 3 unspecified: Secondary | ICD-10-CM | POA: Diagnosis not present

## 2020-03-26 DIAGNOSIS — I129 Hypertensive chronic kidney disease with stage 1 through stage 4 chronic kidney disease, or unspecified chronic kidney disease: Secondary | ICD-10-CM | POA: Diagnosis not present

## 2020-03-26 DIAGNOSIS — E785 Hyperlipidemia, unspecified: Secondary | ICD-10-CM | POA: Diagnosis not present

## 2020-03-26 DIAGNOSIS — Z79899 Other long term (current) drug therapy: Secondary | ICD-10-CM | POA: Diagnosis not present

## 2020-03-26 DIAGNOSIS — Z9181 History of falling: Secondary | ICD-10-CM | POA: Diagnosis not present

## 2020-03-26 DIAGNOSIS — R41841 Cognitive communication deficit: Secondary | ICD-10-CM | POA: Diagnosis not present

## 2020-03-26 DIAGNOSIS — M810 Age-related osteoporosis without current pathological fracture: Secondary | ICD-10-CM | POA: Diagnosis not present

## 2020-03-26 DIAGNOSIS — E039 Hypothyroidism, unspecified: Secondary | ICD-10-CM | POA: Diagnosis not present

## 2020-03-26 DIAGNOSIS — F324 Major depressive disorder, single episode, in partial remission: Secondary | ICD-10-CM | POA: Diagnosis not present

## 2020-04-05 DIAGNOSIS — E039 Hypothyroidism, unspecified: Secondary | ICD-10-CM | POA: Diagnosis not present

## 2020-04-05 DIAGNOSIS — E785 Hyperlipidemia, unspecified: Secondary | ICD-10-CM | POA: Diagnosis not present

## 2020-04-05 DIAGNOSIS — F410 Panic disorder [episodic paroxysmal anxiety] without agoraphobia: Secondary | ICD-10-CM | POA: Diagnosis not present

## 2020-04-05 DIAGNOSIS — E441 Mild protein-calorie malnutrition: Secondary | ICD-10-CM | POA: Diagnosis not present

## 2020-04-05 DIAGNOSIS — N183 Chronic kidney disease, stage 3 unspecified: Secondary | ICD-10-CM | POA: Diagnosis not present

## 2020-04-05 DIAGNOSIS — R41841 Cognitive communication deficit: Secondary | ICD-10-CM | POA: Diagnosis not present

## 2020-04-05 DIAGNOSIS — M810 Age-related osteoporosis without current pathological fracture: Secondary | ICD-10-CM | POA: Diagnosis not present

## 2020-04-05 DIAGNOSIS — M1712 Unilateral primary osteoarthritis, left knee: Secondary | ICD-10-CM | POA: Diagnosis not present

## 2020-04-05 DIAGNOSIS — Z79899 Other long term (current) drug therapy: Secondary | ICD-10-CM | POA: Diagnosis not present

## 2020-04-05 DIAGNOSIS — F324 Major depressive disorder, single episode, in partial remission: Secondary | ICD-10-CM | POA: Diagnosis not present

## 2020-04-05 DIAGNOSIS — K581 Irritable bowel syndrome with constipation: Secondary | ICD-10-CM | POA: Diagnosis not present

## 2020-04-05 DIAGNOSIS — S2242XD Multiple fractures of ribs, left side, subsequent encounter for fracture with routine healing: Secondary | ICD-10-CM | POA: Diagnosis not present

## 2020-04-05 DIAGNOSIS — Z9181 History of falling: Secondary | ICD-10-CM | POA: Diagnosis not present

## 2020-04-05 DIAGNOSIS — I129 Hypertensive chronic kidney disease with stage 1 through stage 4 chronic kidney disease, or unspecified chronic kidney disease: Secondary | ICD-10-CM | POA: Diagnosis not present

## 2020-04-11 ENCOUNTER — Other Ambulatory Visit: Payer: Self-pay | Admitting: Family

## 2020-04-11 DIAGNOSIS — E039 Hypothyroidism, unspecified: Secondary | ICD-10-CM

## 2020-04-21 DIAGNOSIS — I129 Hypertensive chronic kidney disease with stage 1 through stage 4 chronic kidney disease, or unspecified chronic kidney disease: Secondary | ICD-10-CM | POA: Diagnosis not present

## 2020-04-21 DIAGNOSIS — E441 Mild protein-calorie malnutrition: Secondary | ICD-10-CM | POA: Diagnosis not present

## 2020-04-21 DIAGNOSIS — E785 Hyperlipidemia, unspecified: Secondary | ICD-10-CM | POA: Diagnosis not present

## 2020-04-21 DIAGNOSIS — S2242XD Multiple fractures of ribs, left side, subsequent encounter for fracture with routine healing: Secondary | ICD-10-CM | POA: Diagnosis not present

## 2020-04-21 DIAGNOSIS — M810 Age-related osteoporosis without current pathological fracture: Secondary | ICD-10-CM | POA: Diagnosis not present

## 2020-04-21 DIAGNOSIS — R41841 Cognitive communication deficit: Secondary | ICD-10-CM | POA: Diagnosis not present

## 2020-04-21 DIAGNOSIS — K581 Irritable bowel syndrome with constipation: Secondary | ICD-10-CM | POA: Diagnosis not present

## 2020-04-21 DIAGNOSIS — Z79899 Other long term (current) drug therapy: Secondary | ICD-10-CM | POA: Diagnosis not present

## 2020-04-21 DIAGNOSIS — N183 Chronic kidney disease, stage 3 unspecified: Secondary | ICD-10-CM | POA: Diagnosis not present

## 2020-04-21 DIAGNOSIS — Z9181 History of falling: Secondary | ICD-10-CM | POA: Diagnosis not present

## 2020-04-21 DIAGNOSIS — E039 Hypothyroidism, unspecified: Secondary | ICD-10-CM | POA: Diagnosis not present

## 2020-04-21 DIAGNOSIS — F410 Panic disorder [episodic paroxysmal anxiety] without agoraphobia: Secondary | ICD-10-CM | POA: Diagnosis not present

## 2020-04-21 DIAGNOSIS — M1712 Unilateral primary osteoarthritis, left knee: Secondary | ICD-10-CM | POA: Diagnosis not present

## 2020-04-21 DIAGNOSIS — F324 Major depressive disorder, single episode, in partial remission: Secondary | ICD-10-CM | POA: Diagnosis not present

## 2020-04-22 ENCOUNTER — Other Ambulatory Visit: Payer: Self-pay | Admitting: Family

## 2020-04-22 DIAGNOSIS — E039 Hypothyroidism, unspecified: Secondary | ICD-10-CM

## 2020-05-23 ENCOUNTER — Inpatient Hospital Stay (HOSPITAL_COMMUNITY)
Admission: EM | Admit: 2020-05-23 | Discharge: 2020-06-19 | DRG: 329 | Disposition: E | Payer: PPO | Attending: General Surgery | Admitting: General Surgery

## 2020-05-23 ENCOUNTER — Emergency Department (HOSPITAL_COMMUNITY): Payer: PPO | Admitting: Certified Registered Nurse Anesthetist

## 2020-05-23 ENCOUNTER — Encounter (HOSPITAL_COMMUNITY): Payer: Self-pay

## 2020-05-23 ENCOUNTER — Emergency Department (HOSPITAL_COMMUNITY): Payer: PPO

## 2020-05-23 ENCOUNTER — Inpatient Hospital Stay (HOSPITAL_COMMUNITY): Payer: PPO

## 2020-05-23 ENCOUNTER — Encounter (HOSPITAL_COMMUNITY): Admission: EM | Disposition: E | Payer: Self-pay | Source: Home / Self Care

## 2020-05-23 ENCOUNTER — Other Ambulatory Visit: Payer: Self-pay

## 2020-05-23 DIAGNOSIS — Z20822 Contact with and (suspected) exposure to covid-19: Secondary | ICD-10-CM | POA: Diagnosis present

## 2020-05-23 DIAGNOSIS — I5031 Acute diastolic (congestive) heart failure: Secondary | ICD-10-CM | POA: Diagnosis present

## 2020-05-23 DIAGNOSIS — A419 Sepsis, unspecified organism: Secondary | ICD-10-CM | POA: Diagnosis not present

## 2020-05-23 DIAGNOSIS — R0989 Other specified symptoms and signs involving the circulatory and respiratory systems: Secondary | ICD-10-CM

## 2020-05-23 DIAGNOSIS — R1031 Right lower quadrant pain: Secondary | ICD-10-CM | POA: Diagnosis not present

## 2020-05-23 DIAGNOSIS — E11649 Type 2 diabetes mellitus with hypoglycemia without coma: Secondary | ICD-10-CM | POA: Diagnosis not present

## 2020-05-23 DIAGNOSIS — J3489 Other specified disorders of nose and nasal sinuses: Secondary | ICD-10-CM | POA: Diagnosis not present

## 2020-05-23 DIAGNOSIS — J9601 Acute respiratory failure with hypoxia: Secondary | ICD-10-CM | POA: Diagnosis not present

## 2020-05-23 DIAGNOSIS — I493 Ventricular premature depolarization: Secondary | ICD-10-CM | POA: Diagnosis not present

## 2020-05-23 DIAGNOSIS — Z7189 Other specified counseling: Secondary | ICD-10-CM | POA: Diagnosis not present

## 2020-05-23 DIAGNOSIS — E440 Moderate protein-calorie malnutrition: Secondary | ICD-10-CM | POA: Diagnosis present

## 2020-05-23 DIAGNOSIS — H9190 Unspecified hearing loss, unspecified ear: Secondary | ICD-10-CM | POA: Diagnosis present

## 2020-05-23 DIAGNOSIS — K922 Gastrointestinal hemorrhage, unspecified: Secondary | ICD-10-CM | POA: Diagnosis not present

## 2020-05-23 DIAGNOSIS — E039 Hypothyroidism, unspecified: Secondary | ICD-10-CM | POA: Diagnosis present

## 2020-05-23 DIAGNOSIS — R188 Other ascites: Secondary | ICD-10-CM | POA: Diagnosis not present

## 2020-05-23 DIAGNOSIS — Z79890 Hormone replacement therapy (postmenopausal): Secondary | ICD-10-CM

## 2020-05-23 DIAGNOSIS — R069 Unspecified abnormalities of breathing: Secondary | ICD-10-CM

## 2020-05-23 DIAGNOSIS — J15211 Pneumonia due to Methicillin susceptible Staphylococcus aureus: Secondary | ICD-10-CM | POA: Diagnosis not present

## 2020-05-23 DIAGNOSIS — E872 Acidosis: Secondary | ICD-10-CM | POA: Diagnosis not present

## 2020-05-23 DIAGNOSIS — Z9889 Other specified postprocedural states: Secondary | ICD-10-CM | POA: Diagnosis not present

## 2020-05-23 DIAGNOSIS — Z781 Physical restraint status: Secondary | ICD-10-CM

## 2020-05-23 DIAGNOSIS — I4891 Unspecified atrial fibrillation: Secondary | ICD-10-CM

## 2020-05-23 DIAGNOSIS — K565 Intestinal adhesions [bands], unspecified as to partial versus complete obstruction: Principal | ICD-10-CM | POA: Diagnosis present

## 2020-05-23 DIAGNOSIS — Z881 Allergy status to other antibiotic agents status: Secondary | ICD-10-CM

## 2020-05-23 DIAGNOSIS — E878 Other disorders of electrolyte and fluid balance, not elsewhere classified: Secondary | ICD-10-CM | POA: Diagnosis not present

## 2020-05-23 DIAGNOSIS — S2249XA Multiple fractures of ribs, unspecified side, initial encounter for closed fracture: Secondary | ICD-10-CM | POA: Diagnosis not present

## 2020-05-23 DIAGNOSIS — I700 Atherosclerosis of aorta: Secondary | ICD-10-CM | POA: Diagnosis present

## 2020-05-23 DIAGNOSIS — E162 Hypoglycemia, unspecified: Secondary | ICD-10-CM | POA: Diagnosis present

## 2020-05-23 DIAGNOSIS — D649 Anemia, unspecified: Secondary | ICD-10-CM | POA: Diagnosis not present

## 2020-05-23 DIAGNOSIS — Z4659 Encounter for fitting and adjustment of other gastrointestinal appliance and device: Secondary | ICD-10-CM

## 2020-05-23 DIAGNOSIS — D62 Acute posthemorrhagic anemia: Secondary | ICD-10-CM | POA: Diagnosis not present

## 2020-05-23 DIAGNOSIS — J9690 Respiratory failure, unspecified, unspecified whether with hypoxia or hypercapnia: Secondary | ICD-10-CM | POA: Diagnosis not present

## 2020-05-23 DIAGNOSIS — N1330 Unspecified hydronephrosis: Secondary | ICD-10-CM | POA: Diagnosis not present

## 2020-05-23 DIAGNOSIS — J810 Acute pulmonary edema: Secondary | ICD-10-CM | POA: Diagnosis not present

## 2020-05-23 DIAGNOSIS — K55029 Acute infarction of small intestine, extent unspecified: Secondary | ICD-10-CM | POA: Diagnosis not present

## 2020-05-23 DIAGNOSIS — Z66 Do not resuscitate: Secondary | ICD-10-CM | POA: Diagnosis not present

## 2020-05-23 DIAGNOSIS — M810 Age-related osteoporosis without current pathological fracture: Secondary | ICD-10-CM | POA: Diagnosis not present

## 2020-05-23 DIAGNOSIS — R061 Stridor: Secondary | ICD-10-CM | POA: Diagnosis not present

## 2020-05-23 DIAGNOSIS — R1084 Generalized abdominal pain: Secondary | ICD-10-CM | POA: Diagnosis not present

## 2020-05-23 DIAGNOSIS — Z885 Allergy status to narcotic agent status: Secondary | ICD-10-CM

## 2020-05-23 DIAGNOSIS — Z90710 Acquired absence of both cervix and uterus: Secondary | ICD-10-CM | POA: Diagnosis not present

## 2020-05-23 DIAGNOSIS — N179 Acute kidney failure, unspecified: Secondary | ICD-10-CM | POA: Diagnosis not present

## 2020-05-23 DIAGNOSIS — Z6820 Body mass index (BMI) 20.0-20.9, adult: Secondary | ICD-10-CM | POA: Diagnosis not present

## 2020-05-23 DIAGNOSIS — R579 Shock, unspecified: Secondary | ICD-10-CM | POA: Diagnosis not present

## 2020-05-23 DIAGNOSIS — R1110 Vomiting, unspecified: Secondary | ICD-10-CM | POA: Diagnosis not present

## 2020-05-23 DIAGNOSIS — R5082 Postprocedural fever: Secondary | ICD-10-CM | POA: Diagnosis not present

## 2020-05-23 DIAGNOSIS — K5669 Other partial intestinal obstruction: Secondary | ICD-10-CM | POA: Diagnosis not present

## 2020-05-23 DIAGNOSIS — M4186 Other forms of scoliosis, lumbar region: Secondary | ICD-10-CM | POA: Diagnosis not present

## 2020-05-23 DIAGNOSIS — K56609 Unspecified intestinal obstruction, unspecified as to partial versus complete obstruction: Secondary | ICD-10-CM | POA: Diagnosis present

## 2020-05-23 DIAGNOSIS — K449 Diaphragmatic hernia without obstruction or gangrene: Secondary | ICD-10-CM | POA: Diagnosis present

## 2020-05-23 DIAGNOSIS — Z789 Other specified health status: Secondary | ICD-10-CM

## 2020-05-23 DIAGNOSIS — J90 Pleural effusion, not elsewhere classified: Secondary | ICD-10-CM | POA: Diagnosis not present

## 2020-05-23 DIAGNOSIS — K3189 Other diseases of stomach and duodenum: Secondary | ICD-10-CM | POA: Diagnosis not present

## 2020-05-23 DIAGNOSIS — G319 Degenerative disease of nervous system, unspecified: Secondary | ICD-10-CM | POA: Diagnosis not present

## 2020-05-23 DIAGNOSIS — K5900 Constipation, unspecified: Secondary | ICD-10-CM | POA: Diagnosis present

## 2020-05-23 DIAGNOSIS — R7989 Other specified abnormal findings of blood chemistry: Secondary | ICD-10-CM | POA: Diagnosis present

## 2020-05-23 DIAGNOSIS — Z978 Presence of other specified devices: Secondary | ICD-10-CM

## 2020-05-23 DIAGNOSIS — R9431 Abnormal electrocardiogram [ECG] [EKG]: Secondary | ICD-10-CM | POA: Diagnosis not present

## 2020-05-23 DIAGNOSIS — I6782 Cerebral ischemia: Secondary | ICD-10-CM | POA: Diagnosis not present

## 2020-05-23 DIAGNOSIS — Z515 Encounter for palliative care: Secondary | ICD-10-CM | POA: Diagnosis not present

## 2020-05-23 DIAGNOSIS — R109 Unspecified abdominal pain: Secondary | ICD-10-CM | POA: Diagnosis present

## 2020-05-23 DIAGNOSIS — Z4682 Encounter for fitting and adjustment of non-vascular catheter: Secondary | ICD-10-CM | POA: Diagnosis not present

## 2020-05-23 DIAGNOSIS — I10 Essential (primary) hypertension: Secondary | ICD-10-CM | POA: Diagnosis not present

## 2020-05-23 DIAGNOSIS — K6389 Other specified diseases of intestine: Secondary | ICD-10-CM | POA: Diagnosis not present

## 2020-05-23 DIAGNOSIS — R001 Bradycardia, unspecified: Secondary | ICD-10-CM | POA: Diagnosis not present

## 2020-05-23 DIAGNOSIS — R739 Hyperglycemia, unspecified: Secondary | ICD-10-CM | POA: Diagnosis not present

## 2020-05-23 DIAGNOSIS — J9811 Atelectasis: Secondary | ICD-10-CM | POA: Diagnosis not present

## 2020-05-23 DIAGNOSIS — R54 Age-related physical debility: Secondary | ICD-10-CM | POA: Diagnosis present

## 2020-05-23 DIAGNOSIS — K55019 Acute (reversible) ischemia of small intestine, extent unspecified: Secondary | ICD-10-CM | POA: Diagnosis not present

## 2020-05-23 DIAGNOSIS — I480 Paroxysmal atrial fibrillation: Secondary | ICD-10-CM | POA: Diagnosis present

## 2020-05-23 DIAGNOSIS — G9341 Metabolic encephalopathy: Secondary | ICD-10-CM | POA: Diagnosis not present

## 2020-05-23 DIAGNOSIS — J1520 Pneumonia due to staphylococcus, unspecified: Secondary | ICD-10-CM | POA: Diagnosis not present

## 2020-05-23 DIAGNOSIS — N19 Unspecified kidney failure: Secondary | ICD-10-CM | POA: Diagnosis not present

## 2020-05-23 DIAGNOSIS — D72829 Elevated white blood cell count, unspecified: Secondary | ICD-10-CM | POA: Diagnosis not present

## 2020-05-23 DIAGNOSIS — J150 Pneumonia due to Klebsiella pneumoniae: Secondary | ICD-10-CM | POA: Diagnosis not present

## 2020-05-23 DIAGNOSIS — M4856XA Collapsed vertebra, not elsewhere classified, lumbar region, initial encounter for fracture: Secondary | ICD-10-CM | POA: Diagnosis not present

## 2020-05-23 DIAGNOSIS — E876 Hypokalemia: Secondary | ICD-10-CM | POA: Diagnosis not present

## 2020-05-23 DIAGNOSIS — Z0189 Encounter for other specified special examinations: Secondary | ICD-10-CM

## 2020-05-23 DIAGNOSIS — Z79899 Other long term (current) drug therapy: Secondary | ICD-10-CM

## 2020-05-23 DIAGNOSIS — I491 Atrial premature depolarization: Secondary | ICD-10-CM | POA: Diagnosis not present

## 2020-05-23 DIAGNOSIS — D72819 Decreased white blood cell count, unspecified: Secondary | ICD-10-CM | POA: Diagnosis not present

## 2020-05-23 DIAGNOSIS — R0902 Hypoxemia: Secondary | ICD-10-CM

## 2020-05-23 DIAGNOSIS — K559 Vascular disorder of intestine, unspecified: Secondary | ICD-10-CM | POA: Diagnosis present

## 2020-05-23 DIAGNOSIS — J690 Pneumonitis due to inhalation of food and vomit: Secondary | ICD-10-CM | POA: Diagnosis not present

## 2020-05-23 DIAGNOSIS — R339 Retention of urine, unspecified: Secondary | ICD-10-CM | POA: Diagnosis not present

## 2020-05-23 DIAGNOSIS — K5790 Diverticulosis of intestine, part unspecified, without perforation or abscess without bleeding: Secondary | ICD-10-CM | POA: Diagnosis present

## 2020-05-23 DIAGNOSIS — Z91041 Radiographic dye allergy status: Secondary | ICD-10-CM

## 2020-05-23 DIAGNOSIS — Z880 Allergy status to penicillin: Secondary | ICD-10-CM

## 2020-05-23 DIAGNOSIS — T17908A Unspecified foreign body in respiratory tract, part unspecified causing other injury, initial encounter: Secondary | ICD-10-CM | POA: Diagnosis not present

## 2020-05-23 DIAGNOSIS — K5901 Slow transit constipation: Secondary | ICD-10-CM | POA: Diagnosis not present

## 2020-05-23 DIAGNOSIS — Z9911 Dependence on respirator [ventilator] status: Secondary | ICD-10-CM | POA: Diagnosis not present

## 2020-05-23 DIAGNOSIS — Z452 Encounter for adjustment and management of vascular access device: Secondary | ICD-10-CM | POA: Diagnosis not present

## 2020-05-23 DIAGNOSIS — R4182 Altered mental status, unspecified: Secondary | ICD-10-CM | POA: Diagnosis not present

## 2020-05-23 DIAGNOSIS — L8990 Pressure ulcer of unspecified site, unspecified stage: Secondary | ICD-10-CM | POA: Insufficient documentation

## 2020-05-23 DIAGNOSIS — Z888 Allergy status to other drugs, medicaments and biological substances status: Secondary | ICD-10-CM

## 2020-05-23 DIAGNOSIS — I110 Hypertensive heart disease with heart failure: Secondary | ICD-10-CM | POA: Diagnosis present

## 2020-05-23 DIAGNOSIS — J189 Pneumonia, unspecified organism: Secondary | ICD-10-CM | POA: Diagnosis not present

## 2020-05-23 DIAGNOSIS — Z882 Allergy status to sulfonamides status: Secondary | ICD-10-CM

## 2020-05-23 DIAGNOSIS — R451 Restlessness and agitation: Secondary | ICD-10-CM | POA: Diagnosis not present

## 2020-05-23 HISTORY — PX: LAPAROTOMY: SHX154

## 2020-05-23 LAB — COMPREHENSIVE METABOLIC PANEL
ALT: 17 U/L (ref 0–44)
AST: 26 U/L (ref 15–41)
Albumin: 4 g/dL (ref 3.5–5.0)
Alkaline Phosphatase: 48 U/L (ref 38–126)
Anion gap: 15 (ref 5–15)
BUN: 12 mg/dL (ref 8–23)
CO2: 23 mmol/L (ref 22–32)
Calcium: 10.3 mg/dL (ref 8.9–10.3)
Chloride: 103 mmol/L (ref 98–111)
Creatinine, Ser: 0.9 mg/dL (ref 0.44–1.00)
GFR, Estimated: >60 mL/min (ref >60)
Glucose, Bld: 187 mg/dL — ABNORMAL HIGH (ref 70–99)
Potassium: 3.7 mmol/L (ref 3.5–5.1)
Sodium: 141 mmol/L (ref 135–145)
Total Bilirubin: 0.7 mg/dL (ref 0.3–1.2)
Total Protein: 7.3 g/dL (ref 6.5–8.1)

## 2020-05-23 LAB — CBC WITH DIFFERENTIAL/PLATELET
Abs Immature Granulocytes: 0.12 10*3/uL — ABNORMAL HIGH (ref 0.00–0.07)
Basophils Absolute: 0.1 10*3/uL (ref 0.0–0.1)
Basophils Relative: 0 %
Eosinophils Absolute: 0 10*3/uL (ref 0.0–0.5)
Eosinophils Relative: 0 %
HCT: 46.4 % — ABNORMAL HIGH (ref 36.0–46.0)
Hemoglobin: 14.9 g/dL (ref 12.0–15.0)
Immature Granulocytes: 1 %
Lymphocytes Relative: 6 %
Lymphs Abs: 1.3 10*3/uL (ref 0.7–4.0)
MCH: 31.7 pg (ref 26.0–34.0)
MCHC: 32.1 g/dL (ref 30.0–36.0)
MCV: 98.7 fL (ref 80.0–100.0)
Monocytes Absolute: 0.8 10*3/uL (ref 0.1–1.0)
Monocytes Relative: 4 %
Neutro Abs: 18.7 10*3/uL — ABNORMAL HIGH (ref 1.7–7.7)
Neutrophils Relative %: 89 %
Platelets: 265 10*3/uL (ref 150–400)
RBC: 4.7 MIL/uL (ref 3.87–5.11)
RDW: 13.8 % (ref 11.5–15.5)
WBC: 21 10*3/uL — ABNORMAL HIGH (ref 4.0–10.5)
nRBC: 0 % (ref 0.0–0.2)

## 2020-05-23 LAB — URINALYSIS, ROUTINE W REFLEX MICROSCOPIC
Bacteria, UA: NONE SEEN
Bilirubin Urine: NEGATIVE
Glucose, UA: 50 mg/dL — AB
Ketones, ur: 5 mg/dL — AB
Leukocytes,Ua: NEGATIVE
Nitrite: NEGATIVE
Protein, ur: NEGATIVE mg/dL
Specific Gravity, Urine: 1.014 (ref 1.005–1.030)
pH: 6 (ref 5.0–8.0)

## 2020-05-23 LAB — RESP PANEL BY RT-PCR (FLU A&B, COVID) ARPGX2
Influenza A by PCR: NEGATIVE
Influenza B by PCR: NEGATIVE
SARS Coronavirus 2 by RT PCR: NEGATIVE

## 2020-05-23 LAB — POCT I-STAT 7, (LYTES, BLD GAS, ICA,H+H)
Acid-base deficit: 5 mmol/L — ABNORMAL HIGH (ref 0.0–2.0)
Bicarbonate: 21.6 mmol/L (ref 20.0–28.0)
Calcium, Ion: 1.22 mmol/L (ref 1.15–1.40)
HCT: 27 % — ABNORMAL LOW (ref 36.0–46.0)
Hemoglobin: 9.2 g/dL — ABNORMAL LOW (ref 12.0–15.0)
O2 Saturation: 100 %
Patient temperature: 35.7
Potassium: 3.6 mmol/L (ref 3.5–5.1)
Sodium: 143 mmol/L (ref 135–145)
TCO2: 23 mmol/L (ref 22–32)
pCO2 arterial: 45.9 mmHg (ref 32.0–48.0)
pH, Arterial: 7.273 — ABNORMAL LOW (ref 7.350–7.450)
pO2, Arterial: 477 mmHg — ABNORMAL HIGH (ref 83.0–108.0)

## 2020-05-23 LAB — LACTIC ACID, PLASMA
Lactic Acid, Venous: 3 mmol/L (ref 0.5–1.9)
Lactic Acid, Venous: 3.4 mmol/L (ref 0.5–1.9)

## 2020-05-23 LAB — LIPASE, BLOOD: Lipase: 21 U/L (ref 11–51)

## 2020-05-23 SURGERY — LAPAROTOMY, EXPLORATORY
Anesthesia: General | Site: Abdomen

## 2020-05-23 MED ORDER — CHLORHEXIDINE GLUCONATE CLOTH 2 % EX PADS
6.0000 | MEDICATED_PAD | Freq: Every day | CUTANEOUS | Status: DC
  Administered 2020-05-24 – 2020-06-02 (×9): 6 via TOPICAL

## 2020-05-23 MED ORDER — DEXAMETHASONE SODIUM PHOSPHATE 4 MG/ML IJ SOLN
INTRAMUSCULAR | Status: DC | PRN
  Administered 2020-05-23: 5 mg via INTRAVENOUS

## 2020-05-23 MED ORDER — SODIUM CHLORIDE 0.9 % IV BOLUS: 500.0000 mL | Freq: Once | INTRAVENOUS | Status: DC

## 2020-05-23 MED ORDER — DEXAMETHASONE SODIUM PHOSPHATE 10 MG/ML IJ SOLN
INTRAMUSCULAR | Status: AC
  Filled 2020-05-23: qty 1

## 2020-05-23 MED ORDER — ONDANSETRON 4 MG PO TBDP: 4.0000 mg | ORAL_TABLET | Freq: Four times a day (QID) | ORAL | Status: DC | PRN

## 2020-05-23 MED ORDER — ROSUVASTATIN CALCIUM 10 MG PO TABS: 10.0000 mg | ORAL_TABLET | Freq: Every day | ORAL | Status: DC

## 2020-05-23 MED ORDER — ALBUMIN HUMAN 5 % IV SOLN: INTRAVENOUS | Status: DC | PRN

## 2020-05-23 MED ORDER — HYDROMORPHONE HCL 1 MG/ML IJ SOLN
0.5000 mg | INTRAMUSCULAR | Status: DC | PRN
  Administered 2020-05-24: 0.5 mg via INTRAVENOUS
  Filled 2020-05-23: qty 1

## 2020-05-23 MED ORDER — 0.9 % SODIUM CHLORIDE (POUR BTL) OPTIME
TOPICAL | Status: DC | PRN
  Administered 2020-05-23: 3000 mL

## 2020-05-23 MED ORDER — ROCURONIUM BROMIDE 10 MG/ML (PF) SYRINGE
PREFILLED_SYRINGE | INTRAVENOUS | Status: AC
  Filled 2020-05-23: qty 10

## 2020-05-23 MED ORDER — DIPHENHYDRAMINE HCL 50 MG/ML IJ SOLN: 12.5000 mg | Freq: Four times a day (QID) | INTRAMUSCULAR | Status: DC | PRN

## 2020-05-23 MED ORDER — DOCUSATE SODIUM 100 MG PO CAPS: 100.0000 mg | ORAL_CAPSULE | Freq: Two times a day (BID) | ORAL | Status: DC

## 2020-05-23 MED ORDER — ORAL CARE MOUTH RINSE
15.0000 mL | Freq: Two times a day (BID) | OROMUCOSAL | Status: DC
  Administered 2020-05-24 – 2020-05-25 (×4): 15 mL via OROMUCOSAL

## 2020-05-23 MED ORDER — ENOXAPARIN SODIUM 40 MG/0.4ML ~~LOC~~ SOLN
40.0000 mg | Freq: Every day | SUBCUTANEOUS | Status: DC
  Administered 2020-05-24: 40 mg via SUBCUTANEOUS
  Filled 2020-05-23: qty 0.4

## 2020-05-23 MED ORDER — ONDANSETRON HCL 4 MG/2ML IJ SOLN: 4.0000 mg | Freq: Four times a day (QID) | INTRAMUSCULAR | Status: DC | PRN

## 2020-05-23 MED ORDER — IPRATROPIUM-ALBUTEROL 0.5-2.5 (3) MG/3ML IN SOLN: 3.0000 mL | RESPIRATORY_TRACT | Status: DC | PRN

## 2020-05-23 MED ORDER — SODIUM CHLORIDE 0.9 % IV SOLN
1.0000 g | INTRAVENOUS | Status: AC
  Administered 2020-05-23: 1 g via INTRAVENOUS
  Filled 2020-05-23: qty 1

## 2020-05-23 MED ORDER — IOHEXOL 9 MG/ML PO SOLN
ORAL | Status: AC
  Filled 2020-05-23: qty 500

## 2020-05-23 MED ORDER — BISACODYL 10 MG RE SUPP: 10.0000 mg | Freq: Every day | RECTAL | Status: DC | PRN

## 2020-05-23 MED ORDER — ONDANSETRON HCL 4 MG/2ML IJ SOLN
INTRAMUSCULAR | Status: AC
  Filled 2020-05-23: qty 2

## 2020-05-23 MED ORDER — SUCCINYLCHOLINE CHLORIDE 200 MG/10ML IV SOSY
PREFILLED_SYRINGE | INTRAVENOUS | Status: DC | PRN
  Administered 2020-05-23: 120 mg via INTRAVENOUS

## 2020-05-23 MED ORDER — LIP MEDEX EX OINT
TOPICAL_OINTMENT | CUTANEOUS | Status: AC
  Filled 2020-05-23: qty 7

## 2020-05-23 MED ORDER — PROPOFOL 10 MG/ML IV BOLUS
INTRAVENOUS | Status: AC
  Filled 2020-05-23: qty 20

## 2020-05-23 MED ORDER — ONDANSETRON HCL 4 MG/2ML IJ SOLN
4.0000 mg | Freq: Once | INTRAMUSCULAR | Status: AC
  Administered 2020-05-23: 4 mg via INTRAVENOUS
  Filled 2020-05-23: qty 2

## 2020-05-23 MED ORDER — PHENYLEPHRINE 40 MCG/ML (10ML) SYRINGE FOR IV PUSH (FOR BLOOD PRESSURE SUPPORT)
PREFILLED_SYRINGE | INTRAVENOUS | Status: AC
  Filled 2020-05-23: qty 10

## 2020-05-23 MED ORDER — FENTANYL CITRATE (PF) 100 MCG/2ML IJ SOLN: 25.0000 ug | INTRAMUSCULAR | Status: DC | PRN

## 2020-05-23 MED ORDER — ACETAMINOPHEN 10 MG/ML IV SOLN
1000.0000 mg | Freq: Four times a day (QID) | INTRAVENOUS | Status: DC
  Administered 2020-05-23: 1000 mg via INTRAVENOUS

## 2020-05-23 MED ORDER — CHLORHEXIDINE GLUCONATE 0.12 % MT SOLN
15.0000 mL | Freq: Two times a day (BID) | OROMUCOSAL | Status: DC
  Administered 2020-05-24 – 2020-05-26 (×6): 15 mL via OROMUCOSAL
  Filled 2020-05-23 (×2): qty 15

## 2020-05-23 MED ORDER — ALBUMIN HUMAN 5 % IV SOLN
INTRAVENOUS | Status: AC
  Filled 2020-05-23: qty 500

## 2020-05-23 MED ORDER — SODIUM CHLORIDE 0.9 % IV BOLUS
1000.0000 mL | Freq: Once | INTRAVENOUS | Status: AC
  Administered 2020-05-23: 1000 mL via INTRAVENOUS

## 2020-05-23 MED ORDER — RACEPINEPHRINE HCL 2.25 % IN NEBU
0.5000 mL | INHALATION_SOLUTION | Freq: Once | RESPIRATORY_TRACT | Status: AC
  Administered 2020-05-23: 0.5 mL via RESPIRATORY_TRACT
  Filled 2020-05-23: qty 0.5

## 2020-05-23 MED ORDER — ONDANSETRON HCL 4 MG/2ML IJ SOLN
INTRAMUSCULAR | Status: DC | PRN
  Administered 2020-05-23: 4 mg via INTRAVENOUS

## 2020-05-23 MED ORDER — ALPRAZOLAM 0.25 MG PO TABS: 0.2500 mg | ORAL_TABLET | ORAL | Status: DC

## 2020-05-23 MED ORDER — IOHEXOL 300 MG/ML  SOLN
100.0000 mL | Freq: Once | INTRAMUSCULAR | Status: AC | PRN
  Administered 2020-05-23: 100 mL via INTRAVENOUS

## 2020-05-23 MED ORDER — PHENYLEPHRINE 40 MCG/ML (10ML) SYRINGE FOR IV PUSH (FOR BLOOD PRESSURE SUPPORT)
PREFILLED_SYRINGE | INTRAVENOUS | Status: DC | PRN
  Administered 2020-05-23: 120 ug via INTRAVENOUS
  Administered 2020-05-23 (×2): 80 ug via INTRAVENOUS

## 2020-05-23 MED ORDER — DIPHENHYDRAMINE HCL 12.5 MG/5ML PO ELIX: 12.5000 mg | ORAL_SOLUTION | Freq: Four times a day (QID) | ORAL | Status: DC | PRN

## 2020-05-23 MED ORDER — ONDANSETRON HCL 4 MG/2ML IJ SOLN: 4.0000 mg | Freq: Once | INTRAMUSCULAR | Status: DC | PRN

## 2020-05-23 MED ORDER — MORPHINE SULFATE (PF) 2 MG/ML IV SOLN
2.0000 mg | Freq: Once | INTRAVENOUS | Status: AC
  Administered 2020-05-23: 2 mg via INTRAVENOUS
  Filled 2020-05-23: qty 1

## 2020-05-23 MED ORDER — BUPROPION HCL ER (SR) 150 MG PO TB12: 150.0000 mg | ORAL_TABLET | Freq: Two times a day (BID) | ORAL | Status: DC

## 2020-05-23 MED ORDER — LACTATED RINGERS IV SOLN: INTRAVENOUS | Status: DC | PRN

## 2020-05-23 MED ORDER — AMLODIPINE BESYLATE 5 MG PO TABS: 2.5000 mg | ORAL_TABLET | Freq: Every day | ORAL | Status: DC

## 2020-05-23 MED ORDER — ROCURONIUM BROMIDE 10 MG/ML (PF) SYRINGE
PREFILLED_SYRINGE | INTRAVENOUS | Status: DC | PRN
  Administered 2020-05-23: 50 mg via INTRAVENOUS

## 2020-05-23 MED ORDER — ACETAMINOPHEN 10 MG/ML IV SOLN
INTRAVENOUS | Status: AC
  Administered 2020-05-23: 1000 mg via INTRAVENOUS
  Filled 2020-05-23: qty 100

## 2020-05-23 MED ORDER — AZELASTINE HCL 0.15 % NA SOLN: 2.0000 | Freq: Every day | NASAL | Status: DC

## 2020-05-23 MED ORDER — SIMETHICONE 80 MG PO CHEW: 40.0000 mg | CHEWABLE_TABLET | Freq: Four times a day (QID) | ORAL | Status: DC | PRN

## 2020-05-23 MED ORDER — FENTANYL CITRATE (PF) 100 MCG/2ML IJ SOLN
INTRAMUSCULAR | Status: AC
  Filled 2020-05-23: qty 2

## 2020-05-23 MED ORDER — LIDOCAINE HCL (PF) 2 % IJ SOLN
INTRAMUSCULAR | Status: AC
  Filled 2020-05-23: qty 5

## 2020-05-23 MED ORDER — MIRTAZAPINE 30 MG PO TBDP
30.0000 mg | ORAL_TABLET | Freq: Every day | ORAL | Status: DC
  Filled 2020-05-23 (×3): qty 1

## 2020-05-23 MED ORDER — LIDOCAINE 2% (20 MG/ML) 5 ML SYRINGE
INTRAMUSCULAR | Status: DC | PRN
  Administered 2020-05-23: 100 mg via INTRAVENOUS

## 2020-05-23 MED ORDER — MORPHINE SULFATE (PF) 4 MG/ML IV SOLN: 4.0000 mg | Freq: Once | INTRAVENOUS | Status: DC

## 2020-05-23 MED ORDER — PROPOFOL 10 MG/ML IV BOLUS
INTRAVENOUS | Status: DC | PRN
  Administered 2020-05-23: 80 mg via INTRAVENOUS

## 2020-05-23 MED ORDER — LEVOTHYROXINE SODIUM 88 MCG PO TABS: 88.0000 ug | ORAL_TABLET | Freq: Every day | ORAL | Status: DC

## 2020-05-23 MED ORDER — DEXMEDETOMIDINE HCL IN NACL 200 MCG/50ML IV SOLN
INTRAVENOUS | Status: AC
  Filled 2020-05-23: qty 50

## 2020-05-23 MED ORDER — FENTANYL CITRATE (PF) 100 MCG/2ML IJ SOLN
INTRAMUSCULAR | Status: DC | PRN
  Administered 2020-05-23 (×3): 25 ug via INTRAVENOUS
  Administered 2020-05-23: 50 ug via INTRAVENOUS
  Administered 2020-05-23: 25 ug via INTRAVENOUS
  Administered 2020-05-23: 50 ug via INTRAVENOUS

## 2020-05-23 MED ORDER — ACETAMINOPHEN 500 MG PO TABS
1000.0000 mg | ORAL_TABLET | Freq: Four times a day (QID) | ORAL | Status: DC
  Filled 2020-05-23: qty 2

## 2020-05-23 MED ORDER — SODIUM CHLORIDE 0.9 % IV SOLN: INTRAVENOUS | Status: DC

## 2020-05-23 MED ORDER — SUGAMMADEX SODIUM 200 MG/2ML IV SOLN
INTRAVENOUS | Status: DC | PRN
  Administered 2020-05-23 (×2): 200 mg via INTRAVENOUS

## 2020-05-23 SURGICAL SUPPLY — 48 items
APL PRP STRL LF DISP 70% ISPRP (MISCELLANEOUS) ×1
BLADE EXTENDED COATED 6.5IN (ELECTRODE) IMPLANT
CELLS DAT CNTRL 66122 CELL SVR (MISCELLANEOUS) IMPLANT
CHLORAPREP W/TINT 26 (MISCELLANEOUS) ×2 IMPLANT
COVER WAND RF STERILE (DRAPES) IMPLANT
DRAIN CHANNEL 19F RND (DRAIN) IMPLANT
DRAPE LAPAROSCOPIC ABDOMINAL (DRAPES) ×2 IMPLANT
DRAPE SHEET LG 3/4 BI-LAMINATE (DRAPES) IMPLANT
DRSG OPSITE POSTOP 4X10 (GAUZE/BANDAGES/DRESSINGS) IMPLANT
DRSG OPSITE POSTOP 4X6 (GAUZE/BANDAGES/DRESSINGS) ×1 IMPLANT
DRSG OPSITE POSTOP 4X8 (GAUZE/BANDAGES/DRESSINGS) IMPLANT
ELECT REM PT RETURN 15FT ADLT (MISCELLANEOUS) ×2 IMPLANT
EVACUATOR SILICONE 100CC (DRAIN) IMPLANT
GAUZE SPONGE 4X4 12PLY STRL (GAUZE/BANDAGES/DRESSINGS) IMPLANT
GLOVE BIO SURGEON STRL SZ 6.5 (GLOVE) ×4 IMPLANT
GLOVE BIOGEL PI IND STRL 7.0 (GLOVE) ×2 IMPLANT
GLOVE BIOGEL PI INDICATOR 7.0 (GLOVE) ×2
GOWN STRL REUS W/TWL XL LVL3 (GOWN DISPOSABLE) ×6 IMPLANT
HANDLE SUCTION POOLE (INSTRUMENTS) IMPLANT
KIT TURNOVER KIT A (KITS) IMPLANT
LEGGING LITHOTOMY PAIR STRL (DRAPES) IMPLANT
LIGASURE IMPACT 36 18CM CVD LR (INSTRUMENTS) ×1 IMPLANT
PACK COLON (CUSTOM PROCEDURE TRAY) ×2 IMPLANT
PENCIL SMOKE EVACUATOR (MISCELLANEOUS) IMPLANT
RELOAD PROXIMATE 75MM BLUE (ENDOMECHANICALS) ×4 IMPLANT
RELOAD STAPLE 75 3.8 BLU REG (ENDOMECHANICALS) IMPLANT
RETRACTOR WND ALEXIS 18 MED (MISCELLANEOUS) IMPLANT
RETRACTOR WND ALEXIS 25 LRG (MISCELLANEOUS) IMPLANT
RTRCTR WOUND ALEXIS 18CM MED (MISCELLANEOUS)
RTRCTR WOUND ALEXIS 25CM LRG (MISCELLANEOUS)
STAPLER GUN LINEAR PROX 60 (STAPLE) ×1 IMPLANT
STAPLER PROXIMATE 75MM BLUE (STAPLE) ×1 IMPLANT
STAPLER VISISTAT 35W (STAPLE) ×2 IMPLANT
SUCTION POOLE HANDLE (INSTRUMENTS)
SUT ETHILON 3 0 PS 1 (SUTURE) IMPLANT
SUT NOVA 1 T20/GS 25DT (SUTURE) ×4 IMPLANT
SUT PDS AB 1 CTX 36 (SUTURE) IMPLANT
SUT SILK 2 0 (SUTURE) ×2
SUT SILK 2 0 SH CR/8 (SUTURE) ×2 IMPLANT
SUT SILK 2-0 18XBRD TIE 12 (SUTURE) ×1 IMPLANT
SUT SILK 3 0 (SUTURE) ×2
SUT SILK 3 0 SH CR/8 (SUTURE) ×2 IMPLANT
SUT SILK 3-0 18XBRD TIE 12 (SUTURE) ×1 IMPLANT
SUT VIC AB 2-0 SH 18 (SUTURE) ×2 IMPLANT
TOWEL OR 17X26 10 PK STRL BLUE (TOWEL DISPOSABLE) IMPLANT
TOWEL OR NON WOVEN STRL DISP B (DISPOSABLE) ×2 IMPLANT
TRAY FOLEY MTR SLVR 16FR STAT (SET/KITS/TRAYS/PACK) ×2 IMPLANT
TUBING CONNECTING 10 (TUBING) ×4 IMPLANT

## 2020-05-23 NOTE — Anesthesia Preprocedure Evaluation (Signed)
Anesthesia Evaluation  Patient identified by MRN, date of birth, ID band Patient awake    Reviewed: Allergy & Precautions, NPO status , Patient's Chart, lab work & pertinent test results  Airway Mallampati: II  TM Distance: >3 FB Neck ROM: Full    Dental  (+) Poor Dentition, Dental Advisory Given, Teeth Intact   Pulmonary    breath sounds clear to auscultation       Cardiovascular  Rhythm:Regular Rate:Normal     Neuro/Psych    GI/Hepatic   Endo/Other    Renal/GU      Musculoskeletal   Abdominal   Peds  Hematology   Anesthesia Other Findings   Reproductive/Obstetrics                             Anesthesia Physical Anesthesia Plan  ASA: III and emergent  Anesthesia Plan: General   Post-op Pain Management:    Induction: Intravenous, Rapid sequence and Cricoid pressure planned  PONV Risk Score and Plan: Ondansetron  Airway Management Planned: Oral ETT  Additional Equipment:   Intra-op Plan:   Post-operative Plan: Possible Post-op intubation/ventilation  Informed Consent: I have reviewed the patients History and Physical, chart, labs and discussed the procedure including the risks, benefits and alternatives for the proposed anesthesia with the patient or authorized representative who has indicated his/her understanding and acceptance.     Dental advisory given  Plan Discussed with: CRNA and Anesthesiologist  Anesthesia Plan Comments: (86 year old female with probable SBO and possible vascular compromise of bowel. Plan RSI possible arterial line.  Lisa Murray)        Anesthesia Quick Evaluation

## 2020-05-23 NOTE — H&P (Signed)
CC: abd pain  Requesting provider: Dr Denton Lank  HPI: Lisa Murray is an 86 y.o. female who is here for acute onset abd pain that started ~4am this morning.  She has had one episode of emesis.  Her pain is worsening.  It has been constant.  She admits to some recent constipation as well.  Surgical history significant for abdominal hysterectomy appendectomy and bladder repair.  Patient lives independently.  She is status post a fall earlier in the year requiring hospitalization and rehab.  Past Medical History:  Diagnosis Date  . Dizziness 02/27/2020  . Hypothyroidism 02/27/2020  . Senile osteoporosis 02/27/2020    Past Surgical History:  Procedure Laterality Date  . ABDOMINAL HYSTERECTOMY    . APPENDECTOMY    . BLADDER REPAIR      No family history on file.  Social:  reports that she has never smoked. She has never used smokeless tobacco. She reports that she does not drink alcohol. No history on file for drug use.  Allergies:  Allergies  Allergen Reactions  . Iodine Hives  . Codeine Nausea And Vomiting  . Doxycycline Nausea And Vomiting  . Fosamax [Alendronate] Nausea And Vomiting  . Macrobid [Nitrofurantoin] Nausea And Vomiting  . Tramadol Nausea And Vomiting  . Erythromycin Rash  . Morphine And Related Anxiety  . Novocain [Procaine] Anxiety  . Penicillin G Benzathine Rash  . Sulfa Antibiotics Rash    Medications: I have reviewed the patient's current medications.  Results for orders placed or performed during the hospital encounter of 05/26/2020 (from the past 48 hour(s))  CBC with Differential     Status: Abnormal   Collection Time: 06/12/2020  5:21 PM  Result Value Ref Range   WBC 21.0 (H) 4.0 - 10.5 K/uL   RBC 4.70 3.87 - 5.11 MIL/uL   Hemoglobin 14.9 12.0 - 15.0 g/dL   HCT 69.6 (H) 36 - 46 %   MCV 98.7 80.0 - 100.0 fL   MCH 31.7 26.0 - 34.0 pg   MCHC 32.1 30.0 - 36.0 g/dL   RDW 29.5 28.4 - 13.2 %   Platelets 265 150 - 400 K/uL    Comment: REPEATED TO  VERIFY SPECIMEN CHECKED FOR CLOTS    nRBC 0.0 0.0 - 0.2 %   Neutrophils Relative % 89 %   Neutro Abs 18.7 (H) 1.7 - 7.7 K/uL   Lymphocytes Relative 6 %   Lymphs Abs 1.3 0.7 - 4.0 K/uL   Monocytes Relative 4 %   Monocytes Absolute 0.8 0.1 - 1.0 K/uL   Eosinophils Relative 0 %   Eosinophils Absolute 0.0 0.0 - 0.5 K/uL   Basophils Relative 0 %   Basophils Absolute 0.1 0.0 - 0.1 K/uL   Immature Granulocytes 1 %   Abs Immature Granulocytes 0.12 (H) 0.00 - 0.07 K/uL    Comment: Performed at [facility], [address], [place], [place] [address]  Comprehensive metabolic panel     Status: Abnormal   Collection Time: 05/31/2020  5:21 PM  Result Value Ref Range   Sodium 141 135 - 145 mmol/L   Potassium 3.7 3.5 - 5.1 mmol/L   Chloride 103 98 - 111 mmol/L   CO2 23 22 - 32 mmol/L   Glucose, Bld 187 (H) 70 - 99 mg/dL    Comment: Glucose reference range applies only to samples taken after fasting for at least 8 hours.   BUN 12 8 - 23 mg/dL   Creatinine, Ser 2.72 0.44 - 1.00  mg/dL   Calcium 53.6 8.9 - 64.4 mg/dL   Total Protein 7.3 6.5 - 8.1 g/dL   Albumin 4.0 3.5 - 5.0 g/dL   AST 26 15 - 41 U/L   ALT 17 0 - 44 U/L   Alkaline Phosphatase 48 38 - 126 U/L   Total Bilirubin 0.7 0.3 - 1.2 mg/dL   GFR, Estimated >03 >47 mL/min    Comment: (NOTE) Calculated using the CKD-EPI Creatinine Equation (2021)    Anion gap 15 5 - 15    Comment: Performed at [facility], [address], [place], [place] [address]  Lipase, blood     Status: None   Collection Time: 05/19/2020  5:21 PM  Result Value Ref Range   Lipase 21 11 - 51 U/L    Comment: Performed at [facility], [address], [place], [place] [address]  Lactic acid, plasma     Status: Abnormal   Collection Time: 06/03/2020  5:21 PM  Result Value Ref Range   Lactic Acid, Venous 3.0 (HH) 0.5 - 1.9 mmol/L    Comment: CRITICAL RESULT CALLED TO, READ BACK BY AND VERIFIED WITH: LIMPSCOMB,E RN  @1822  ON 05/22/2020 JACKSON,K Performed at [facility], [address], [place], [place]     CT Abdomen Pelvis W Contrast  Result Date: 06/03/2020 CLINICAL DATA:  Concern for obstruction EXAM: CT ABDOMEN AND PELVIS WITH CONTRAST TECHNIQUE: Multidetector CT imaging of the abdomen and pelvis was performed using the standard protocol following bolus administration of intravenous contrast. CONTRAST:  14/10/2019 OMNIPAQUE IOHEXOL 300 MG/ML  SOLN COMPARISON:  Lumbar radiograph 02/19/2020, chest radiograph 02/22/2020. FINDINGS: Lower chest: Hiatal hernia, as described below. Atelectatic changes in the adjacent lung base and posterior right lung as well. Mild centrilobular emphysema. Normal heart size. No pericardial effusion. Coronary artery calcifications are present. Hepatobiliary: Small attenuation cysts in the posterior right lobe liver (2/9). Focal fatty infiltration along the falciform ligament. No concerning focal lesion. Normal liver attenuation. Smooth liver surface contour. Mild gallbladder distention without wall thickening. Pericholecystic fluid is likely redistributed. No significant biliary ductal dilatation accounting for normal senescent change. No intraductal gallstones are seen. Pancreas: Partial fatty replacement of the pancreas. No pancreatic ductal dilatation or surrounding inflammatory changes. Spleen: Normal in size. No concerning splenic lesions. Adrenals/Urinary Tract: Normal adrenal glands. There is a slightly delayed right renal nephrogram possibly attributable to some obstructive uropathy with asymmetric moderate right hydroureteronephrosis with the distal ureteral segment deviated towards the right lower quadrant and possibly involved by the obstructive process in the right lower quadrant detailed in the bowel section below. No left urinary tract dilatation. No obstructive urolithiasis. No concerning focal renal lesions. Moderate bladder distension. No bladder wall  thickening or dilatation. Stomach/Bowel: Large hiatal hernia with a degree of likely organo-axial twisting/volvulus with the pylorus remaining below the level of the diaphragm. Distended appearance of the stomach could reflect at least partial obstruction. Duodenum with a normal sweep across the midline abdomen. There are clustered loops of distended, hypoenhancing and thickened air and fluid containing small bowel in the right lower quadrant with a proximal transition point (2/48 as well as an immediately adjacent distal transition point (2/46). Twisting of the mesenteric vessels and deviation towards the right lower quadrant are noted as well. There is adjacent fluid in stranding in the mesenteric leaflets. More distal small bowel is largely decompressed and normally enhancing. There is some mild mural thickening of the cecum though this is possibly reactive.  Small fecalith is contained within a tiny appendiceal stump without adjacent inflammation (5/40). More distal colonic segments are unremarkable aside from scattered noninflamed colonic diverticula. Vascular/Lymphatic: Atherosclerotic calcifications within the abdominal aorta and branch vessels. No aneurysm or ectasia. Twisting of the mesenteric veins and deviation of the vessels towards the right lower quadrant. The some edematous lymphadenopathy. no enlarged abdominopelvic lymph nodes. Reproductive: Uterus is surgically absent. No concerning adnexal lesions. Other: Free fluid in the abdomen and pelvis predominantly centered upon the dilated, thickened and hypoenhancing obstructed loops in the right lower quadrant with some additional intraperitoneal free fluid distributing into the subphrenic space on the right as well. No free air, abscess or collection. No bowel containing abdominal wall hernias are seen. Musculoskeletal: The osseous structures appear diffusely demineralized which may limit detection of small or nondisplaced fractures. Unchanged  compression deformities and vertebral body height loss involving the L1, L2 and L4 levels. Severe levocurvature of the spine, apex L2. Background of multilevel discogenic and facet degenerative changes with additional degenerative changes in the hips and pelvis. No acute or worrisome osseous abnormalities. IMPRESSION: 1. Features of high-grade small bowel obstruction a closed loop in the right lower quadrant demonstrating dilatation, thickening and adjacent inflammation and mural hypoattenuation concerning for vascular compromise. Possibly attributable to a post surgical adhesion given history of hysterectomy and appendectomy. 2. Delayed right renal nephrogram with moderate hydroureteronephrosis with the distal ureter deviated towards the right lower quadrant and possibly involved by the obstructive process in the right lower quadrant. 3. Mild mural thickening of the cecum though this is possibly reactive. 4. Large hiatal hernia with a degree of likely organoaxial twisting with the pylorus remaining below the level of the diaphragm. Distended appearance of the proximal stomach could reflect some partial obstruction 5. Unchanged compression deformities and vertebral body height loss involving the L1, L2 and L4 levels. 6. Aortic Atherosclerosis (ICD10-I70.0). Critical Value/emergent results were called by telephone at the time of interpretation on 05/29/2020 at 7:20 pm to provider Namon CirriKAITLYN WALISIEWICZ , who verbally acknowledged these results. Electronically Signed   By: Kreg ShropshirePrice  DeHay M.D.   On: 2020/04/15 19:20    ROS - all of the below systems have been reviewed with the patient and positives are indicated with bold text General: chills, fever or night sweats Eyes: blurry vision or double vision ENT: epistaxis or sore throat Allergy/Immunology: itchy/watery eyes or nasal congestion Hematologic/Lymphatic: bleeding problems, blood clots or swollen lymph nodes Endocrine: temperature intolerance or unexpected  weight changes Breast: new or changing breast lumps or nipple discharge Resp: cough, shortness of breath, or wheezing CV: chest pain or dyspnea on exertion GI: as per HPI GU: dysuria, trouble voiding, or hematuria MSK: joint pain or joint stiffness Neuro: TIA or stroke symptoms Derm: pruritus and skin lesion changes Psych: anxiety and depression  PE Blood pressure 120/87, pulse 95, temperature 97.8 F (36.6 C), temperature source Oral, resp. rate (!) 23, height 5\' 2"  (1.575 m), weight 50.8 kg, SpO2 98 %. Constitutional: NAD; conversant; no deformities Eyes: Moist conjunctiva; no lid lag; anicteric; PERRL Neck: Trachea midline; no thyromegaly Lungs: Normal respiratory effort; no tactile fremitus CV: RRR; no palpable thrills; no pitting edema GI: Abd significantly tender to palpation with a large mass in the right lower quadrant.   MSK: Normal range of motion of extremities; no clubbing/cyanosis Psychiatric: Appropriate affect; alert and oriented x3 Lymphatic: No palpable cervical or axillary lymphadenopathy  Results for orders placed or performed during the hospital encounter of 09-10-19 (from the past  48 hour(s))  CBC with Differential     Status: Abnormal   Collection Time: 06/16/2020  5:21 PM  Result Value Ref Range   WBC 21.0 (H) 4.0 - 10.5 K/uL   RBC 4.70 3.87 - 5.11 MIL/uL   Hemoglobin 14.9 12.0 - 15.0 g/dL   HCT 79.0 (H) 36 - 46 %   MCV 98.7 80.0 - 100.0 fL   MCH 31.7 26.0 - 34.0 pg   MCHC 32.1 30.0 - 36.0 g/dL   RDW 24.0 97.3 - 53.2 %   Platelets 265 150 - 400 K/uL    Comment: REPEATED TO VERIFY SPECIMEN CHECKED FOR CLOTS    nRBC 0.0 0.0 - 0.2 %   Neutrophils Relative % 89 %   Neutro Abs 18.7 (H) 1.7 - 7.7 K/uL   Lymphocytes Relative 6 %   Lymphs Abs 1.3 0.7 - 4.0 K/uL   Monocytes Relative 4 %   Monocytes Absolute 0.8 0.1 - 1.0 K/uL   Eosinophils Relative 0 %   Eosinophils Absolute 0.0 0.0 - 0.5 K/uL   Basophils Relative 0 %   Basophils Absolute 0.1 0.0 - 0.1  K/uL   Immature Granulocytes 1 %   Abs Immature Granulocytes 0.12 (H) 0.00 - 0.07 K/uL    Comment: Performed at [facility], [address] [place], [place] [address]  Comprehensive metabolic panel     Status: Abnormal   Collection Time: 16-Jun-2020  5:21 PM  Result Value Ref Range   Sodium 141 135 - 145 mmol/L   Potassium 3.7 3.5 - 5.1 mmol/L   Chloride 103 98 - 111 mmol/L   CO2 23 22 - 32 mmol/L   Glucose, Bld 187 (H) 70 - 99 mg/dL    Comment: Glucose reference range applies only to samples taken after fasting for at least 8 hours.   BUN 12 8 - 23 mg/dL   Creatinine, Ser 6.83 0.44 - 1.00 mg/dL   Calcium 41.9 8.9 - 62.2 mg/dL   Total Protein 7.3 6.5 - 8.1 g/dL   Albumin 4.0 3.5 - 5.0 g/dL   AST 26 15 - 41 U/L   ALT 17 0 - 44 U/L   Alkaline Phosphatase 48 38 - 126 U/L   Total Bilirubin 0.7 0.3 - 1.2 mg/dL   GFR, Estimated >29 >79 mL/min    Comment: (NOTE) Calculated using the CKD-EPI Creatinine Equation (2021)    Anion gap 15 5 - 15    Comment: Performed at [facility], [address], [place], [place] [address]  Lipase, blood     Status: None   Collection Time: 06-16-2020  5:21 PM  Result Value Ref Range   Lipase 21 11 - 51 U/L    Comment: Performed at [facility], [address], [place], [place] [address]  Lactic acid, plasma     Status: Abnormal   Collection Time: 2020-06-16  5:21 PM  Result Value Ref Range   Lactic Acid, Venous 3.0 (HH) 0.5 - 1.9 mmol/L    Comment: CRITICAL RESULT CALLED TO, READ BACK BY AND VERIFIED WITH: LIMPSCOMB,E RN @1822  ON Jun 16, 2020 JACKSON,K Performed at [facility], [address], [place], [place]     CT Abdomen Pelvis W Contrast  Result Date: 2020/06/16 CLINICAL DATA:  Concern for obstruction EXAM: CT ABDOMEN AND PELVIS WITH CONTRAST TECHNIQUE: Multidetector CT imaging of the abdomen and pelvis was performed using the standard protocol following bolus  administration of intravenous contrast. CONTRAST:  OMNIPAQUE IOHEXOL 300 MG/ML  SOLN COMPARISON:  Lumbar radiograph 02/19/2020, chest radiograph 02/22/2020. FINDINGS: Lower chest: Hiatal hernia, as described below. Atelectatic changes in the adjacent lung base and posterior right lung as well. Mild centrilobular emphysema. Normal heart size. No pericardial effusion. Coronary artery calcifications are present. Hepatobiliary: Small attenuation cysts in the posterior right lobe liver (2/9). Focal fatty infiltration along the falciform ligament. No concerning focal lesion. Normal liver attenuation. Smooth liver surface contour. Mild gallbladder distention without wall thickening. Pericholecystic fluid is likely redistributed. No significant biliary ductal dilatation accounting for normal senescent change. No intraductal gallstones are seen. Pancreas: Partial fatty replacement of the pancreas. No pancreatic ductal dilatation or surrounding inflammatory changes. Spleen: Normal in size. No concerning splenic lesions. Adrenals/Urinary Tract: Normal adrenal glands. There is a slightly delayed right renal nephrogram possibly attributable to some obstructive uropathy with asymmetric moderate right hydroureteronephrosis with the distal ureteral segment deviated towards the right lower quadrant and possibly involved by the obstructive process in the right lower quadrant detailed in the bowel section below. No left urinary tract dilatation. No obstructive urolithiasis. No concerning focal renal lesions. Moderate bladder distension. No bladder wall thickening or dilatation. Stomach/Bowel: Large hiatal hernia with a degree of likely organo-axial twisting/volvulus with the pylorus remaining below the level of the diaphragm. Distended appearance of the stomach could reflect at least partial obstruction. Duodenum with a normal sweep across the midline abdomen. There are clustered loops of distended, hypoenhancing and thickened  air and fluid containing small bowel in the right lower quadrant with a proximal transition point (2/48 as well as an immediately adjacent distal transition point (2/46). Twisting of the mesenteric vessels and deviation towards the right lower quadrant are noted as well. There is adjacent fluid in stranding in the mesenteric leaflets. More distal small bowel is largely decompressed and normally enhancing. There is some mild mural thickening of the cecum though this is possibly reactive. Small fecalith is contained within a tiny appendiceal stump without adjacent inflammation (5/40). More distal colonic segments are unremarkable aside from scattered noninflamed colonic diverticula. Vascular/Lymphatic: Atherosclerotic calcifications within the abdominal aorta and branch vessels. No aneurysm or ectasia. Twisting of the mesenteric veins and deviation of the vessels towards the right lower quadrant. The some edematous lymphadenopathy. no enlarged abdominopelvic lymph nodes. Reproductive: Uterus is surgically absent. No concerning adnexal lesions. Other: Free fluid in the abdomen and pelvis predominantly centered upon the dilated, thickened and hypoenhancing obstructed loops in the right lower quadrant with some additional intraperitoneal free fluid distributing into the subphrenic space on the right as well. No free air, abscess or collection. No bowel containing abdominal wall hernias are seen. Musculoskeletal: The osseous structures appear diffusely demineralized which may limit detection of small or nondisplaced fractures. Unchanged compression deformities and vertebral body height loss involving the L1, L2 and L4 levels. Severe levocurvature of the spine, apex L2. Background of multilevel discogenic and facet degenerative changes with additional degenerative changes in the hips and pelvis. No acute or worrisome osseous abnormalities. IMPRESSION: 1. Features of high-grade small bowel obstruction a closed loop in the  right lower quadrant demonstrating dilatation, thickening and adjacent inflammation and mural hypoattenuation concerning for vascular compromise. Possibly attributable to a post surgical adhesion given history of hysterectomy and appendectomy. 2. Delayed right renal nephrogram with moderate hydroureteronephrosis with the distal ureter deviated towards the right lower quadrant and possibly involved by the obstructive process in the right lower quadrant. 3. Mild mural thickening of the cecum though this is  possibly reactive. 4. Large hiatal hernia with a degree of likely organoaxial twisting with the pylorus remaining below the level of the diaphragm. Distended appearance of the proximal stomach could reflect some partial obstruction 5. Unchanged compression deformities and vertebral body height loss involving the L1, L2 and L4 levels. 6. Aortic Atherosclerosis (ICD10-I70.0). Critical Value/emergent results were called by telephone at the time of interpretation on 05/24/2020 at 7:20 pm to provider Namon Cirri , who verbally acknowledged these results. Electronically Signed   By: Kreg Shropshire M.D.   On: 05/24/2020 19:20     A/P: Lisa Murray is an 86 y.o. female with acute onset of right lower quadrant pain.  CT scan shows a closed-loop bowel obstruction with elevated lactate and leukocytosis.  Her physical exam is consistent with this with peritoneal signs in the right lower quadrant and a large mass in the pelvis.  I have recommended an emergent exploratory laparotomy with lysis of adhesions and possible small bowel resection.  We have discussed this in detail including risk of surgical complications like bleeding, infection, damage to adjacent structures, recurrence, wound infections and hernias.  We also have discussed that there is a risk to anesthesia given her age.  We discussed that if no surgery is done, she will most likely expire from this pathology.  She expressed understanding of this and  asked appropriate questions.  All questions were answered.  She agreed to proceed with surgery.   Vanita Panda, MD  Colorectal and General Surgery [facility]

## 2020-05-23 NOTE — Anesthesia Procedure Notes (Signed)
Procedure Name: Intubation Date/Time: 2020-05-27 9:10 PM Performed by: Vanessa [place], CRNA Pre-anesthesia Checklist: Patient identified, Emergency Drugs available, Suction available and Patient being monitored Patient Re-evaluated:Patient Re-evaluated prior to induction Oxygen Delivery Method: Circle system utilized Preoxygenation: Pre-oxygenation with 100% oxygen Induction Type: IV induction and Rapid sequence Laryngoscope Size: 2 and Miller Grade View: Grade I Tube type: Oral Tube size: 7.0 mm Number of attempts: 1 Airway Equipment and Method: Stylet Placement Confirmation: ETT inserted through vocal cords under direct vision,  positive ETCO2 and breath sounds checked- equal and bilateral Secured at: 20 cm Tube secured with: Tape Dental Injury: Teeth and Oropharynx as per pre-operative assessment

## 2020-05-23 NOTE — ED Triage Notes (Signed)
Pt BIBA from home-  Per EMS- Pt c/o RLQ pain, constipation x2 days, nausea and dry heaves today. 1 emesis occurrence today. AOx4, ambulatory.

## 2020-05-23 NOTE — ED Notes (Signed)
Date and time results received: 06/06/2020 2011 (use smartphrase ".now" to insert current time)  Test: LA Critical Value:3.4  Name of Provider Notified: Maisie Fus  Orders Received? Or Actions Taken?:

## 2020-05-23 NOTE — Anesthesia Procedure Notes (Signed)
Arterial Line Insertion Start/End12/18/2021 9:41 PM, 05/23/2020 9:45 PM Performed by: anesthesiologist  Patient location: Pre-op. Preanesthetic checklist: patient identified, IV checked, site marked, risks and benefits discussed, surgical consent, monitors and equipment checked, pre-op evaluation, timeout performed and anesthesia consent Lidocaine 1% used for infiltration Left, radial was placed Catheter size: 20 Fr Hand hygiene performed  and maximum sterile barriers used  Allen's test indicative of satisfactory collateral circulation Attempts: 1 Procedure performed without using ultrasound guided technique. Following insertion, dressing applied. Post procedure assessment: normal and unchanged  Patient tolerated the procedure well with no immediate complications.

## 2020-05-23 NOTE — ED Notes (Signed)
Patient transported to CT 

## 2020-05-23 NOTE — ED Notes (Signed)
Dr. Maisie Fus, surgeon, at bedside. Pt to go to surgery

## 2020-05-23 NOTE — ED Provider Notes (Signed)
[facility]-EMERGENCY DEPT Provider Note   CSN: [id number] Arrival date & time: 05/28/2020  1620     History Chief Complaint  Patient presents with  . Abdominal Pain    Lisa Murray is a 86 y.o. female with past medical history significant for dizziness, hypothyroidism, osteoporosis, frequent falls.  Abdominal surgical history includes abdominal hysterectomy, appendectomy, bladder repair. Had covid vaccinations. Not anticoagulated.  HPI Patient presents to emergency department today via EMS from home with chief complaint of right lower quadrant pain x1 day.  She had one episode of non bloody non bilious emesis. She states the pain started at 4am this morning. She tried to eat cheerios and drink hot tea however vomited immediately after the first bite. She states pain is a cramping sensation.  The pain is located in her right lower quadrant and radiates across to the left.  Pain does not go to her back.  She states the pain has been constant. She admits to passing flatus today.  Her last bowel movement was 2 days ago.  She states she typically goes every day.  She took Tylenol for pain prior to arrival with out symptom improvement. She denies fever, chills, back pain, gross hematuria, urinary frequency, dysuria, diarrhea, blood in stool, pelvic pain, vaginal bleeding, vaginal discharge.     Past Medical History:  Diagnosis Date  . Dizziness 02/27/2020  . Hypothyroidism 02/27/2020  . Senile osteoporosis 02/27/2020    Patient Active Problem List   Diagnosis Date Noted  . Impaired mobility and ADLs 02/27/2020  . Frequent falls 02/27/2020  . Hypothyroidism 02/27/2020  . Senile osteoporosis 02/27/2020  . Dizziness 02/27/2020  . Slow transit constipation 02/27/2020  . Multiple rib fractures 02/19/2020    Past Surgical History:  Procedure Laterality Date  . ABDOMINAL HYSTERECTOMY    . APPENDECTOMY    . BLADDER REPAIR       OB History   No obstetric history on  file.     No family history on file.  Social History   Tobacco Use  . Smoking status: Never Smoker  . Smokeless tobacco: Never Used  Substance Use Topics  . Alcohol use: Never  . Drug use: Not on file    Home Medications Prior to Admission medications   Medication Sig Start Date End Date Taking? Authorizing Provider  acetaminophen (TYLENOL) 650 MG CR tablet Take 650 mg by mouth every 6 (six) hours as needed for pain.    [provider]  ALPRAZolam Prudy Feeler) 0.25 MG tablet Take 1-2 tablets (0.25-0.5 mg total) by mouth See admin instructions. Take 1 tablet (0.25 mg) in the morning and take 2 tablet (0.50mg )at night 03/15/20   Ngetich, Dinah C, NP  amLODipine (NORVASC) 2.5 MG tablet Take 1 tablet (2.5 mg total) by mouth daily. 03/15/20   Ngetich, Dinah C, NP  Azelastine HCl 0.15 % SOLN Place 2 sprays into both nostrils daily. 03/15/20   Ngetich, Dinah C, NP  bisacodyl (DULCOLAX) 10 MG suppository Place 10 mg rectally as needed for moderate constipation.    [provider]  buPROPion (WELLBUTRIN SR) 150 MG 12 hr tablet Take 1 tablet (150 mg total) by mouth 2 (two) times daily. 03/15/20   Ngetich, Dinah C, NP  docusate sodium (COLACE) 100 MG capsule Take 1 capsule (100 mg total) by mouth 2 (two) times daily. 02/24/20   Khatri, Hina, PA-C  guaiFENesin (MUCINEX) 600 MG 12 hr tablet Take by mouth 2 (two) times daily. 1 tablet by mouth  every 12 hours as needed for mucous secretions.    [provider]  Magnesium Hydroxide (MILK OF MAGNESIA PO) Take 30 mLs by mouth as needed.    [provider]  Menthol, Topical Analgesic, (BIOFREEZE) 4 % GEL Apply topically.    [provider]  mirtazapine (REMERON SOL-TAB) 30 MG disintegrating tablet Take 1 tablet (30 mg total) by mouth at bedtime. 03/15/20   Ngetich, Dinah C, NP  Omega-3 Fatty Acids (FISH OIL PO) Take 1 capsule by mouth daily.    [provider]  polyethylene glycol (MIRALAX) 17 g packet Take 17 g  by mouth daily as needed. 02/24/20   Khatri, Hina, PA-C  Psyllium (METAMUCIL FIBER PO) Take by mouth. Mix 1 packet with 8 oz of water, and drink daily for constipation. Hold for loose stool    [provider]  rosuvastatin (CRESTOR) 10 MG tablet Take 1 tablet (10 mg total) by mouth daily. 03/15/20   Ngetich, Dinah C, NP  Sodium Phosphates (RA SALINE ENEMA RE) Place rectally as needed.    [provider]  SYNTHROID 88 MCG tablet Take 1 tablet (88 mcg total) by mouth every morning. 03/15/20   Ngetich, Dinah C, NP  VITAMIN D PO Take 2 capsules by mouth daily.    [provider]    Allergies    Iodine, Codeine, Doxycycline, Fosamax [alendronate], Macrobid [nitrofurantoin], Tramadol, Erythromycin, Morphine and related, Novocain [procaine], Penicillin g benzathine, and Sulfa antibiotics  Review of Systems   Review of Systems All other systems are reviewed and are negative for acute change except as noted in the HPI.  Physical Exam Updated Vital Signs BP (!) 142/91 (BP Location: Right Arm)   Pulse 90   Temp 97.8 F (36.6 C) (Oral)   Resp 19   Ht  (1.575 m)   Wt 50.8 kg   SpO2 97%   BMI 20.49 kg/m   Physical Exam Vitals and nursing note reviewed.  Constitutional:      General: She is not in acute distress.    Appearance: She is not ill-appearing.  HENT:     Head: Normocephalic and atraumatic.     Right Ear: Tympanic membrane and external ear normal.     Left Ear: Tympanic membrane and external ear normal.     Nose: Nose normal.     Mouth/Throat:     Mouth: Mucous membranes are moist.     Pharynx: Oropharynx is clear.  Eyes:     General: No scleral icterus.       Right eye: No discharge.        Left eye: No discharge.     Extraocular Movements: Extraocular movements intact.     Conjunctiva/sclera: Conjunctivae normal.     Pupils: Pupils are equal, round, and reactive to light.  Neck:     Vascular: No JVD.  Cardiovascular:     Rate and Rhythm:  Normal rate and regular rhythm.     Pulses: Normal pulses.          Radial pulses are 2+ on the right side and 2+ on the left side.     Heart sounds: Normal heart sounds.  Pulmonary:     Comments: Lungs clear to auscultation in all fields. Symmetric chest rise. No wheezing, rales, or rhonchi. Abdominal:     General: Bowel sounds are normal.     Tenderness: There is abdominal tenderness in the right lower quadrant.     Comments: Abdomen is soft, non-distended. Voluntary  guarding on exam. No rigidity. No peritoneal signs.  Musculoskeletal:        General: Normal range of motion.     Cervical back: Normal range of motion.  Skin:    General: Skin is warm and dry.     Capillary Refill: Capillary refill takes less than 2 seconds.  Neurological:     Mental Status: She is oriented to person, place, and time.     GCS: GCS eye subscore is 4. GCS verbal subscore is 5. GCS motor subscore is 6.     Comments: Fluent speech, no facial droop.  Psychiatric:        Behavior: Behavior normal.     ED Results / Procedures / Treatments   Labs (all labs ordered are listed, but only abnormal results are displayed) Labs Reviewed  CBC WITH DIFFERENTIAL/PLATELET - Abnormal; Notable for the following components:      Result Value   WBC 21.0 (*)    HCT 46.4 (*)    Neutro Abs 18.7 (*)    Abs Immature Granulocytes 0.12 (*)    All other components within normal limits  COMPREHENSIVE METABOLIC PANEL - Abnormal; Notable for the following components:   Glucose, Bld 187 (*)    All other components within normal limits  URINALYSIS, ROUTINE W REFLEX MICROSCOPIC - Abnormal; Notable for the following components:   Glucose, UA 50 (*)    Hgb urine dipstick SMALL (*)    Ketones, ur 5 (*)    All other components within normal limits  LACTIC ACID, PLASMA - Abnormal; Notable for the following components:   Lactic Acid, Venous 3.0 (*)    All other components within normal limits  LACTIC ACID, PLASMA - Abnormal;  Notable for the following components:   Lactic Acid, Venous 3.4 (*)    All other components within normal limits  RESP PANEL BY RT-PCR (FLU A&B, COVID) [id number]  URINE CULTURE  LIPASE, BLOOD    EKG None  Radiology CT Abdomen Pelvis W Contrast  Result Date: 09-Jun-2020 CLINICAL DATA:  Concern for obstruction EXAM: CT ABDOMEN AND PELVIS WITH CONTRAST TECHNIQUE: Multidetector CT imaging of the abdomen and pelvis was performed using the standard protocol following bolus administration of intravenous contrast. CONTRAST:  OMNIPAQUE IOHEXOL 300 MG/ML  SOLN COMPARISON:  Lumbar radiograph 02/19/2020, chest radiograph 02/22/2020. FINDINGS: Lower chest: Hiatal hernia, as described below. Atelectatic changes in the adjacent lung base and posterior right lung as well. Mild centrilobular emphysema. Normal heart size. No pericardial effusion. Coronary artery calcifications are present. Hepatobiliary: Small attenuation cysts in the posterior right lobe liver (2/9). Focal fatty infiltration along the falciform ligament. No concerning focal lesion. Normal liver attenuation. Smooth liver surface contour. Mild gallbladder distention without wall thickening. Pericholecystic fluid is likely redistributed. No significant biliary ductal dilatation accounting for normal senescent change. No intraductal gallstones are seen. Pancreas: Partial fatty replacement of the pancreas. No pancreatic ductal dilatation or surrounding inflammatory changes. Spleen: Normal in size. No concerning splenic lesions. Adrenals/Urinary Tract: Normal adrenal glands. There is a slightly delayed right renal nephrogram possibly attributable to some obstructive uropathy with asymmetric moderate right hydroureteronephrosis with the distal ureteral segment deviated towards the right lower quadrant and possibly involved by the obstructive process in the right lower quadrant detailed in the bowel section below. No left urinary tract dilatation. No  obstructive urolithiasis. No concerning focal renal lesions. Moderate bladder distension. No bladder wall thickening or dilatation. Stomach/Bowel: Large hiatal hernia with a degree of likely organo-axial twisting/volvulus with  the pylorus remaining below the level of the diaphragm. Distended appearance of the stomach could reflect at least partial obstruction. Duodenum with a normal sweep across the midline abdomen. There are clustered loops of distended, hypoenhancing and thickened air and fluid containing small bowel in the right lower quadrant with a proximal transition point (2/48 as well as an immediately adjacent distal transition point (2/46). Twisting of the mesenteric vessels and deviation towards the right lower quadrant are noted as well. There is adjacent fluid in stranding in the mesenteric leaflets. More distal small bowel is largely decompressed and normally enhancing. There is some mild mural thickening of the cecum though this is possibly reactive. Small fecalith is contained within a tiny appendiceal stump without adjacent inflammation (5/40). More distal colonic segments are unremarkable aside from scattered noninflamed colonic diverticula. Vascular/Lymphatic: Atherosclerotic calcifications within the abdominal aorta and branch vessels. No aneurysm or ectasia. Twisting of the mesenteric veins and deviation of the vessels towards the right lower quadrant. The some edematous lymphadenopathy. no enlarged abdominopelvic lymph nodes. Reproductive: Uterus is surgically absent. No concerning adnexal lesions. Other: Free fluid in the abdomen and pelvis predominantly centered upon the dilated, thickened and hypoenhancing obstructed loops in the right lower quadrant with some additional intraperitoneal free fluid distributing into the subphrenic space on the right as well. No free air, abscess or collection. No bowel containing abdominal wall hernias are seen. Musculoskeletal: The osseous structures appear  diffusely demineralized which may limit detection of small or nondisplaced fractures. Unchanged compression deformities and vertebral body height loss involving the L1, L2 and L4 levels. Severe levocurvature of the spine, apex L2. Background of multilevel discogenic and facet degenerative changes with additional degenerative changes in the hips and pelvis. No acute or worrisome osseous abnormalities. IMPRESSION: 1. Features of high-grade small bowel obstruction a closed loop in the right lower quadrant demonstrating dilatation, thickening and adjacent inflammation and mural hypoattenuation concerning for vascular compromise. Possibly attributable to a post surgical adhesion given history of hysterectomy and appendectomy. 2. Delayed right renal nephrogram with moderate hydroureteronephrosis with the distal ureter deviated towards the right lower quadrant and possibly involved by the obstructive process in the right lower quadrant. 3. Mild mural thickening of the cecum though this is possibly reactive. 4. Large hiatal hernia with a degree of likely organoaxial twisting with the pylorus remaining below the level of the diaphragm. Distended appearance of the proximal stomach could reflect some partial obstruction 5. Unchanged compression deformities and vertebral body height loss involving the L1, L2 and L4 levels. 6. Aortic Atherosclerosis (ICD10-I70.0). Critical Value/emergent results were called by telephone at the time of interpretation on 06/17/2020 at 7:20 pm to provider Namon CirriKAITLYN WALISIEWICZ , who verbally acknowledged these results. Electronically Signed   By: Kreg ShropshirePrice  DeHay M.D.   On: Jul 28, 2019 19:20    Procedures .Critical Care Performed by: Shanon AceWalisiewicz, Ingris Pasquarella E, PA-C Authorized by: Shanon AceWalisiewicz, Eiliana Drone E, PA-C   Critical care provider statement:    Critical care time (minutes):  31   Critical care time was exclusive of:  Separately billable procedures and treating other patients and teaching time    Critical care was necessary to treat or prevent imminent or life-threatening deterioration of the following conditions: SBO.   Critical care was time spent personally by me on the following activities:  Development of treatment plan with patient or surrogate, discussions with consultants, evaluation of patient's response to treatment, examination of patient, obtaining history from patient or surrogate, ordering and performing treatments and interventions, ordering and  review of laboratory studies, ordering and review of radiographic studies, pulse oximetry, re-evaluation of patient's condition and review of old charts   (including critical care time)  Medications Ordered in ED Medications  cefoTEtan (CEFOTAN) 1 g in sodium chloride 0.9 % 100 mL IVPB (has no administration in time range)  sodium chloride 0.9 % bolus 1,000 mL (0 mLs Intravenous Stopped 2020/06/18 1847)  ondansetron (ZOFRAN) injection 4 mg (4 mg Intravenous Given 06-18-20 1721)  iohexol (OMNIPAQUE) 9 MG/ML oral solution (  Contrast Given 2020-06-18 1956)  iohexol (OMNIPAQUE) 300 MG/ML solution 100 mL (100 mLs Intravenous Contrast Given 18-Jun-2020 1840)  morphine 2 MG/ML injection 2 mg (2 mg Intravenous Given 06/18/20 1953)  ondansetron (ZOFRAN) injection 4 mg (4 mg Intravenous Given June 18, 2020 1954)    ED Course  I have reviewed the triage vital signs and the nursing notes.  Pertinent labs & imaging results that were available during my care of the patient were reviewed by me and considered in my medical decision making (see chart for details).    MDM Rules/Calculators/A&P                          History provided by patient with additional history obtained from chart review.    Patient presents to the ED with complaints of abdominal pain. Patient nontoxic appearing, in no apparent distress, vitals WNL . On exam patient tender to right lower quadrant with voluntary guarding, no peritoneal signs. Will evaluate with labs and CT abdomen  pelvis.  Antiemetics and fluids administered.  She denies need for analgesics at this time..   Labs show leukocytosis of 21, no anemia.  No significant electrolyte derangements. LFTs, renal function, and lipase WNL.  Lactic acid elevated at 3.0, repeat lactic trending up at 3.4  Urinalysis not yet collected.  CT AP viewed by me shows large closed loop obstruction with vascular compromise. NG tube ordered. Consulted surgery Dr. Maisie Fus who will evaluate the patient. On reassessment she is requesting pain medicine, morphine ordered. Covid test is negative.  Patient going to OR. Surgery to admit.    Portions of this note were generated with Scientist, clinical (histocompatibility and immunogenetics). Dictation errors may occur despite best attempts at proofreading.   Final Clinical Impression(s) / ED Diagnoses Final diagnoses:  Small bowel obstruction [facility])    Rx / DC Orders ED Discharge Orders    None       Kandice Hams June 18, 2020 2042    Cathren Laine, MD 05/24/20 1535

## 2020-05-23 NOTE — Op Note (Signed)
28-May-2020  9:58 PM  PATIENT:  Lisa Murray  86 y.o. female  Patient Care Team: Mila Palmer, MD as PCP - General (Family Medicine)  PRE-OPERATIVE DIAGNOSIS:  Small bowel obstruction  POST-OPERATIVE DIAGNOSIS:  Closed loop small bowel obstruction  PROCEDURE:  EXPLORATORY LAPAROTOMY, SMALL BOWEL RESECTION   Surgeon(s): Romie Levee, MD  ASSISTANT: none   ANESTHESIA:   general  EBL:  Total I/O In: 1100 [IV Piggyback:1100] Out: 20 [Blood:20]  DRAINS: none   SPECIMEN:  Source of Specimen:  jejunum  DISPOSITION OF SPECIMEN:  PATHOLOGY  COUNTS:  YES  PLAN OF CARE: Admit to inpatient   PATIENT DISPOSITION:  PACU - hemodynamically stable.  INDICATION: 86 year old female who presented to the emergency department with approximately 12 hours of right lower quadrant pain.  CT scan revealed closed-loop small bowel obstruction.  Patient had leukocytosis and elevated lactate.  Physical exam showed peritoneal signs.  I recommended exploratory laparotomy with possible bowel resection and lysis of adhesions.   OR FINDINGS: Closed-loop small bowel obstruction due to adhesive band.  Involved bowel was questionably viable  DESCRIPTION: the patient was identified in the preoperative holding area and taken to the OR where they were laid  on the operating room table.  General anesthesia was induced without difficulty. SCDs were also noted to be in place prior to the initiation of anesthesia.  The patient was then prepped and draped in the usual sterile fashion.   A surgical timeout was performed indicating the correct patient, procedure, positioning and need for preoperative antibiotics.   I began by making a lower midline incision using a 10 blade scalpel.  This was carried down through the subcutaneous tissues using Bovie electrocautery.  The fascia was incised at midline also with electrocautery.  The peritoneum was entered using Metzenbaum scissors.  Upon entering the abdomen,  hemorrhagic ascites was noted.  There was a loop of small bowel that was very discolored and ischemic appearing.  I untwisted the mesentery in a clockwise fashion.  I was able to locate a adhesive band that was the cause of the obstruction.  This was divided using electrocautery and the obstruction was freed and brought out of the abdomen.  There did appear to be some perfusion to the bowel but given the patient's age, I decided to resect this and perform a primary anastomosis.  The viable bowel was transected using a 64mm GIA blue load stapler at either end.  The mesentery was divide using a ligasure device.  The specimen was then sent to pathology.  The 2 ends were then reapproximated and a small enterotomy was made in both ends at the antimesenteric border.  A GIA blue load 75 mm stapler was used to create an anastomosis.  The common enterotomy was then closed with a 60 mm TA blue load stapler.  The corners of the anastomosis were oversewn using interrupted 3-0 silk sutures.  The mesenteric defect was closed using a running 2-0 silk suture.  An antitension suture was placed in the crotch of the anastomosis.  This was then placed back into the abdomen.  The abdomen was then irrigated with approximately 3 L of warm normal saline.  Hemostasis was good.  The omentum was brought down over the abdominal contents.  I palpated the entire colon.  There was significant diverticular disease noted in the sigmoid colon but no other signs of obstruction or inflammation.  The small bowel was ran from the terminal ileum to the ligament of Treitz and  no other adhesive disease was noted.  I then closed the fascia using #1 PDS running sutures.  The subcutaneous tissue was reapproximated using interrupted 2-0 Vicryl sutures and the skin was closed using staples.  A sterile dressing was applied.  The patient was then extubated and sent to the postanesthesia care unit in stable condition.  All counts were correct per operating room  staff.

## 2020-05-23 NOTE — Anesthesia Postprocedure Evaluation (Signed)
Anesthesia Post Note  Patient: Lisa Murray  Procedure(s) Performed: EXPLORATORY LAPAROTOMY WITH SMALL BOWEL RESECTION (N/A Abdomen)     Patient location during evaluation: PACU Anesthesia Type: General Level of consciousness: awake, oriented and patient cooperative Vital Signs Assessment: post-procedure vital signs reviewed and stable Respiratory status: spontaneous breathing, nonlabored ventilation and patient connected to face mask oxygen Cardiovascular status: blood pressure returned to baseline Anesthetic complications: no   No complications documented.  Last Vitals:  Vitals:   Jun 14, 2020 1953 06/14/2020 2212  BP: 120/87 137/70  Pulse: 95 92  Resp: (!) 23 (!) 26  Temp:    SpO2: 98% 99%    Last Pain:  Vitals:   Jun 14, 2020 2041  TempSrc:   PainSc: 6                  Bray Vickerman COKER

## 2020-05-23 NOTE — ED Notes (Signed)
Consent signed and pt brought to OR by CRNA

## 2020-05-24 ENCOUNTER — Inpatient Hospital Stay (HOSPITAL_COMMUNITY): Payer: PPO

## 2020-05-24 ENCOUNTER — Encounter (HOSPITAL_COMMUNITY): Payer: Self-pay | Admitting: General Surgery

## 2020-05-24 DIAGNOSIS — R061 Stridor: Secondary | ICD-10-CM | POA: Diagnosis not present

## 2020-05-24 DIAGNOSIS — K56609 Unspecified intestinal obstruction, unspecified as to partial versus complete obstruction: Secondary | ICD-10-CM | POA: Diagnosis not present

## 2020-05-24 DIAGNOSIS — J9601 Acute respiratory failure with hypoxia: Secondary | ICD-10-CM | POA: Diagnosis not present

## 2020-05-24 LAB — BASIC METABOLIC PANEL
Anion gap: 10 (ref 5–15)
BUN: 13 mg/dL (ref 8–23)
CO2: 22 mmol/L (ref 22–32)
Calcium: 8.7 mg/dL — ABNORMAL LOW (ref 8.9–10.3)
Chloride: 108 mmol/L (ref 98–111)
Creatinine, Ser: 0.76 mg/dL (ref 0.44–1.00)
GFR, Estimated: >60 mL/min (ref >60)
Glucose, Bld: 165 mg/dL — ABNORMAL HIGH (ref 70–99)
Potassium: 3.6 mmol/L (ref 3.5–5.1)
Sodium: 140 mmol/L (ref 135–145)

## 2020-05-24 LAB — CBC
HCT: 34.2 % — ABNORMAL LOW (ref 36.0–46.0)
HCT: 33.3 % — ABNORMAL LOW (ref 36.0–46.0)
Hemoglobin: 10.9 g/dL — ABNORMAL LOW (ref 12.0–15.0)
Hemoglobin: 10.9 g/dL — ABNORMAL LOW (ref 12.0–15.0)
MCH: 32.2 pg (ref 26.0–34.0)
MCH: 32.5 pg (ref 26.0–34.0)
MCHC: 31.9 g/dL (ref 30.0–36.0)
MCHC: 32.7 g/dL (ref 30.0–36.0)
MCV: 100.9 fL — ABNORMAL HIGH (ref 80.0–100.0)
MCV: 99.4 fL (ref 80.0–100.0)
Platelets: 185 10*3/uL (ref 150–400)
Platelets: 163 10*3/uL (ref 150–400)
RBC: 3.39 MIL/uL — ABNORMAL LOW (ref 3.87–5.11)
RBC: 3.35 MIL/uL — ABNORMAL LOW (ref 3.87–5.11)
RDW: 14 % (ref 11.5–15.5)
RDW: 14 % (ref 11.5–15.5)
WBC: 10.4 10*3/uL (ref 4.0–10.5)
WBC: 17 10*3/uL — ABNORMAL HIGH (ref 4.0–10.5)
nRBC: 0 % (ref 0.0–0.2)
nRBC: 0 % (ref 0.0–0.2)

## 2020-05-24 LAB — BLOOD GAS, ARTERIAL
Acid-base deficit: 3.6 mmol/L — ABNORMAL HIGH (ref 0.0–2.0)
Bicarbonate: 21.2 mmol/L (ref 20.0–28.0)
FIO2: 100
MECHVT: 400 mL
O2 Saturation: 100 %
PEEP: 8 cmH2O
Patient temperature: 96.8
RATE: 20 resp/min
pCO2 arterial: 38.5 mmHg (ref 32.0–48.0)
pH, Arterial: 7.355 (ref 7.350–7.450)
pO2, Arterial: 431 mmHg — ABNORMAL HIGH (ref 83.0–108.0)

## 2020-05-24 LAB — MRSA PCR SCREENING: MRSA by PCR: NEGATIVE

## 2020-05-24 LAB — GLUCOSE, CAPILLARY
Glucose-Capillary: 93 mg/dL (ref 70–99)
Glucose-Capillary: 82 mg/dL (ref 70–99)
Glucose-Capillary: 94 mg/dL (ref 70–99)

## 2020-05-24 LAB — LACTIC ACID, PLASMA: Lactic Acid, Venous: 1.5 mmol/L (ref 0.5–1.9)

## 2020-05-24 MED ORDER — METHYLPREDNISOLONE SODIUM SUCC 40 MG IJ SOLR
20.0000 mg | Freq: Four times a day (QID) | INTRAMUSCULAR | Status: AC
  Administered 2020-05-24 – 2020-05-25 (×4): 20 mg via INTRAVENOUS
  Filled 2020-05-24 (×4): qty 1

## 2020-05-24 MED ORDER — POTASSIUM CHLORIDE 10 MEQ/100ML IV SOLN
10.0000 meq | INTRAVENOUS | Status: AC
  Administered 2020-05-24 (×4): 10 meq via INTRAVENOUS
  Filled 2020-05-24 (×4): qty 100

## 2020-05-24 MED ORDER — ETOMIDATE 2 MG/ML IV SOLN
INTRAVENOUS | Status: AC
  Administered 2020-05-24: 15 mg
  Filled 2020-05-24: qty 20

## 2020-05-24 MED ORDER — PHENOL 1.4 % MT LIQD
1.0000 | OROMUCOSAL | Status: DC | PRN
  Filled 2020-05-24: qty 177

## 2020-05-24 MED ORDER — PHENYLEPHRINE 40 MCG/ML (10ML) SYRINGE FOR IV PUSH (FOR BLOOD PRESSURE SUPPORT): 80.0000 ug | PREFILLED_SYRINGE | Freq: Once | INTRAVENOUS | Status: DC | PRN

## 2020-05-24 MED ORDER — NOREPINEPHRINE 4 MG/250ML-% IV SOLN
INTRAVENOUS | Status: AC
  Filled 2020-05-24: qty 250

## 2020-05-24 MED ORDER — ALPRAZOLAM 0.5 MG PO TABS: 0.5000 mg | ORAL_TABLET | Freq: Every day | ORAL | Status: DC

## 2020-05-24 MED ORDER — MIDAZOLAM HCL 2 MG/2ML IJ SOLN
INTRAMUSCULAR | Status: AC
  Administered 2020-05-24: 2 mg
  Filled 2020-05-24: qty 4

## 2020-05-24 MED ORDER — MIDAZOLAM 50MG/50ML (1MG/ML) PREMIX INFUSION
INTRAVENOUS | Status: AC
  Filled 2020-05-24: qty 50

## 2020-05-24 MED ORDER — RACEPINEPHRINE HCL 2.25 % IN NEBU
0.5000 mL | INHALATION_SOLUTION | Freq: Once | RESPIRATORY_TRACT | Status: DC
  Filled 2020-05-24: qty 0.5

## 2020-05-24 MED ORDER — DEXAMETHASONE SODIUM PHOSPHATE 4 MG/ML IJ SOLN
4.0000 mg | Freq: Once | INTRAMUSCULAR | Status: AC
  Administered 2020-05-24: 4 mg via INTRAVENOUS
  Filled 2020-05-24: qty 1

## 2020-05-24 MED ORDER — ROCURONIUM BROMIDE 10 MG/ML (PF) SYRINGE
PREFILLED_SYRINGE | INTRAVENOUS | Status: AC
  Administered 2020-05-24: 60 mg
  Filled 2020-05-24: qty 10

## 2020-05-24 MED ORDER — FENTANYL 2500MCG IN NS 250ML (10MCG/ML) PREMIX INFUSION
0.0000 ug/h | INTRAVENOUS | Status: DC
  Administered 2020-05-24: 100 ug/h via INTRAVENOUS
  Administered 2020-05-24: 25 ug/h via INTRAVENOUS
  Administered 2020-05-25: 100 ug/h via INTRAVENOUS
  Filled 2020-05-24 (×2): qty 250

## 2020-05-24 MED ORDER — RACEPINEPHRINE HCL 2.25 % IN NEBU
0.5000 mL | INHALATION_SOLUTION | RESPIRATORY_TRACT | Status: DC | PRN
  Administered 2020-05-24 (×2): 0.5 mL via RESPIRATORY_TRACT
  Filled 2020-05-24: qty 0.5

## 2020-05-24 MED ORDER — MIDAZOLAM 50MG/50ML (1MG/ML) PREMIX INFUSION
0.5000 mg/h | INTRAVENOUS | Status: DC
  Administered 2020-05-24: 0.5 mg/h via INTRAVENOUS
  Filled 2020-05-24: qty 50

## 2020-05-24 MED ORDER — ALPRAZOLAM 0.25 MG PO TABS: 0.2500 mg | ORAL_TABLET | Freq: Every evening | ORAL | Status: DC | PRN

## 2020-05-24 MED ORDER — ALPRAZOLAM 0.25 MG PO TABS: 0.2500 mg | ORAL_TABLET | Freq: Every day | ORAL | Status: DC

## 2020-05-24 MED ORDER — IPRATROPIUM-ALBUTEROL 0.5-2.5 (3) MG/3ML IN SOLN: 3.0000 mL | RESPIRATORY_TRACT | Status: DC | PRN

## 2020-05-24 MED ORDER — FENTANYL CITRATE (PF) 100 MCG/2ML IJ SOLN
INTRAMUSCULAR | Status: AC
  Administered 2020-05-24: 50 ug
  Filled 2020-05-24: qty 2

## 2020-05-24 MED ORDER — INSULIN ASPART 100 UNIT/ML ~~LOC~~ SOLN
2.0000 [IU] | SUBCUTANEOUS | Status: DC
  Administered 2020-05-26: 4 [IU] via SUBCUTANEOUS
  Administered 2020-05-26 – 2020-06-01 (×14): 2 [IU] via SUBCUTANEOUS
  Administered 2020-06-02: 4 [IU] via SUBCUTANEOUS
  Administered 2020-06-02: 16:00:00 2 [IU] via SUBCUTANEOUS
  Administered 2020-06-02 (×3): 4 [IU] via SUBCUTANEOUS

## 2020-05-24 NOTE — Progress Notes (Signed)
NAME:  Lisa Murray, MRN:  [id number], DOB:  1930-11-05, LOS: 1 ADMISSION DATE:  05/30/2020, CONSULTATION DATE:  05/27/2020 REFERRING MD:  Romie Levee, CHIEF COMPLAINT:  Stridor  Brief History   Lisa Murray is an 86 year old woman with a history of hypothyroidism, osteoporosis, here with acute abdominal pain, closed loop small bowel obstruction.   s/p Ex lap and LOA   History of present illness   Per Surgeon's note, acute abdominal pain 12/5 at 4am, with emesis.  Recent constipation.  Hx of abdominal hysterectomy and bladder repair.   S/p LOA and ex lap, Hemorrhagic ascites, questionably viable bowel. SB resection and primary anastamosis.  Adhesive band lysed.    Upon arrival from the OR, patient was exhibiting stridor.  O2 sat 100% on room air.  Experiencing throat pain. Stridor improved after racemic epi, duonebs   Past Medical History  Hx of Fall, requiring hospitalization and rehab Hypothyroidism Osteoporosis HTN S/p ab hysterectomy, bladder repair Appendectomy   Significant Hospital Events   S.p ex lap, LOA  Consults:  PCCM  Procedures:  Ex-lap with LOA, small bowel resection with primary anastamosis  Significant Diagnostic Tests:  CT abd/pelv 1. Features of high-grade small bowel obstruction a closed loop in the right lower quadrant demonstrating dilatation, thickening and adjacent inflammation and mural hypoattenuation concerning for vascular compromise. Possibly attributable to a post surgical adhesion given history of hysterectomy and appendectomy. 2. Delayed right renal nephrogram with moderate hydroureteronephrosis with the distal ureter deviated towards the right lower quadrant and possibly involved by the obstructive process in the right lower quadrant. 3. Mild mural thickening of the cecum though this is possibly reactive. 4. Large hiatal hernia with a degree of likely organoaxial twisting with the pylorus remaining below the level of the  diaphragm. Distended appearance of the proximal stomach could reflect some partial obstruction 5. Unchanged compression deformities and vertebral body height loss involving the L1, L2 and L4 levels. 6. Aortic Atherosclerosis (ICD10-I70.0).   CXR: parenchyma appears normal, hiatal hernia, NG in place.   Micro Data:    Antimicrobials:    Interim history/subjective:  Reintubated overnight for progressive stridor.  Objective   Blood pressure 100/64, pulse 88, temperature 97.7 F (36.5 C), temperature source Axillary, resp. rate 16, height 5\' 2"  (1.575 m), weight 50.8 kg, SpO2 100 %.    Vent Mode: PRVC FiO2 (%):  [40 %-100 %] 40 % Set Rate:  [16 bmp-20 bmp] 16 bmp Vt Set:  [400 mL] 400 mL PEEP:  [5 cmH20-8 cmH20] 5 cmH20 Plateau Pressure:  [14 cmH20-17 cmH20] 14 cmH20   Intake/Output Summary (Last 24 hours) at 05/24/2020 0925 Last data filed at 05/24/2020 0900 Gross per 24 hour  Intake 4084.45 ml  Output 595 ml  Net 3489.45 ml   Filed Weights   06/11/2020 1648 05/25/2020 1707  Weight: 50.8 kg 50.8 kg    Examination: General: ill appearing elderly woman laying in bed in NAD, intubated and sedated   HENT: [place]/AT, eyes anicteric, oral mucosa moist. Lungs: CTAB, breathing synchronously with MV Cardiovascular: irreg rhythm, regular rate. SR with PVCs on tele. Abdomen: soft, NT. Midline incision without bleeding or drainage. Extremities: no peripheral edema, no cyanosis or clubbing Neuro: RASS -5   Resolved Hospital Problem list     Assessment & Plan:   Post-extubation stridor requiring reintubation  -Remain intubated today. Daily SAT & SBT as appropriate. Hopeful for extubation tomorrow. Has 6.5 ETT in place now, previously 7.0 -steroids x 24 hours -LTVV,  4-8cc/kg IBW, goal Pplat <30 and DP<15 -PAD protocol for sedation -VAP prevention protocol.  SBO, s/p LOA and partial resection -NG tube to LIWS.   Hypothyroidism -Cont daily levothyroxine  Hypergylcemia, due to  acute illness -accuchecks Q4h+ SSI PRN  Acute anemia, likely dilutional and due to operative blood loss -transfuse for Hb<7 or hemodynamically significant bleeding -con't to monitor -follow up CBC this morning  Lactic acid due to ischemic bowel -repeat LA this morning  Best practice (evaluated daily)   Diet: NPO Pain/Anxiety/Delirium protocol (if indicated): yes VAP protocol (if indicated): yes DVT prophylaxis: lovenox  GI prophylaxis: protonix Glucose control: SSI Mobility: progressive last date of multidisciplinary goals of care discussion Family and staff present  Summary of discussion  Follow up goals of care discussion due Code Status: Full  Disposition: ICU   Labs   CBC: Recent Labs  Lab 06/05/2020 1721 06/09/2020 2151 05/24/20 0239  WBC 21.0*  --  10.4  NEUTROABS 18.7*  --   --   HGB 14.9 9.2* 10.9*  HCT 46.4* 27.0* 34.2*  MCV 98.7  --  100.9*  PLT 265  --  185    Basic Metabolic Panel: Recent Labs  Lab 05/31/2020 1721 06/14/2020 2151 05/24/20 0239  NA 141 143 140  K 3.7 3.6 3.6  CL 103  --  108  CO2 23  --  22  GLUCOSE 187*  --  165*  BUN 12  --  13  CREATININE 0.90  --  0.76  CALCIUM 10.3  --  8.7*   GFR: Estimated Creatinine Clearance: 39.2 mL/min (by C-G formula based on SCr of 0.76 mg/dL). Recent Labs  Lab 06/12/2020 1721 05/20/2020 1903 05/24/20 0239  WBC 21.0*  --  10.4  LATICACIDVEN 3.0* 3.4*  --     Liver Function Tests: Recent Labs  Lab 06/16/2020 1721  AST 26  ALT 17  ALKPHOS 48  BILITOT 0.7  PROT 7.3  ALBUMIN 4.0   Recent Labs  Lab 05/21/2020 1721  LIPASE 21   No results for input(s): AMMONIA in the last 168 hours.  ABG    Component Value Date/Time   PHART 7.355 05/24/2020 0558   PCO2ART 38.5 05/24/2020 0558   PO2ART 431 (H) 05/24/2020 0558   HCO3 21.2 05/24/2020 0558   TCO2 23 06/08/2020 2151   ACIDBASEDEF 3.6 (H) 05/24/2020 0558   O2SAT 100.0 05/24/2020 0558     Coagulation Profile: No results for input(s): INR,  PROTIME in the last 168 hours.  Cardiac Enzymes: No results for input(s): CKTOTAL, CKMB, CKMBINDEX, TROPONINI in the last 168 hours.  HbA1C: No results found for: HGBA1C  CBG: No results for input(s): GLUCAP in the last 168 hours.   This patient is critically ill with multiple organ system failure which requires frequent high complexity decision making, assessment, support, evaluation, and titration of therapies. This was completed through the application of advanced monitoring technologies and extensive interpretation of multiple databases. During this encounter critical care time was devoted to patient care services described in this note for 30 minutes.   Steffanie Dunn, DO 05/24/20 9:32 AM [facility]

## 2020-05-24 NOTE — Progress Notes (Signed)
Nursing note: Late Entry OG Tube out this am per Dr Chestine Spore... concern about placement, STAT chest xray done, numerous attempts to replace tube without success .  Surgery notified earlier today

## 2020-05-24 NOTE — Progress Notes (Addendum)
Pt admitted to ICU. Pt has audible stridor and is unable to swallow secretions. E-link notified. Respiratory called to bedside. Surgeon notified. Providers chose not to intubate at this time.

## 2020-05-24 NOTE — Plan of Care (Signed)
Major general surgery post-operative care plan initiated. Stridor noticeably less audible after racemic epinephrine. Vital signs stable.

## 2020-05-24 NOTE — Progress Notes (Signed)
Progress Note: General Surgery Service   Chief Complaint/Subjective: Re-intubated this morning after experiencing stridor post-op.  NG dislodged with intubation so OG now in place.  Objective: Vital signs in last 24 hours: Temp:  [96.6 F (35.9 C)-98.2 F (36.8 C)] 97.6 F (36.4 C) (12/06 0715) Pulse Rate:  [83-100] 94 (12/06 0200) Resp:  [15-26] 16 (12/06 0737) BP: (100-162)/(64-94) 100/64 (12/06 0715) SpO2:  [91 %-100 %] 91 % (12/06 0735) Arterial Line BP: (100-173)/(46-74) 106/59 (12/06 0737) FiO2 (%):  [40 %-100 %] 40 % (12/06 0735) Weight:  [50.8 kg] 50.8 kg (12/05 1707)    Intake/Output from previous day: 12/05 0701 - 12/06 0700 In: 4083.3 [I.V.:1383.3; IV Piggyback:2200] Out: 595 [Urine:575; Blood:20] Intake/Output this shift: No intake/output data recorded.  Gen: Sedated, ventilated  Resp: Ventilated, bilateral breath sounds,   Card: Episode of a-fib this AM  Abd: Soft, incision c/d/i w/ dressing, non-distended  Lab Results: CBC  Recent Labs    2020/06/19 1721 Jun 19, 2020 1721 06-19-20 2151 05/24/20 0239  WBC 21.0*  --   --  10.4  HGB 14.9   < > 9.2* 10.9*  HCT 46.4*   < > 27.0* 34.2*  PLT 265  --   --  185   < > = values in this interval not displayed.   BMET Recent Labs    06-19-20 1721 Jun 19, 2020 1721 June 19, 2020 2151 05/24/20 0239  NA 141   < > 143 140  K 3.7   < > 3.6 3.6  CL 103  --   --  108  CO2 23  --   --  22  GLUCOSE 187*  --   --  165*  BUN 12  --   --  13  CREATININE 0.90  --   --  0.76  CALCIUM 10.3  --   --  8.7*   < > = values in this interval not displayed.   PT/INR No results for input(s): LABPROT, INR in the last 72 hours. ABG Recent Labs    Jun 19, 2020 2151 05/24/20 0558  PHART 7.273* 7.355  HCO3 21.6 21.2    Anti-infectives: Anti-infectives (From admission, onward)   Start     Dose/Rate Route Frequency Ordered Stop   2020-06-19 2100  cefoTEtan (CEFOTAN) 1 g in sodium chloride 0.9 % 100 mL IVPB        1 g 200 mL/hr over 30  Minutes Intravenous On call to O.R. 06/19/2020 2008 19-Jun-2020 2112      Medications: Scheduled Meds: . acetaminophen  1,000 mg Oral Q6H  . ALPRAZolam  0.25 mg Oral Daily   And  . [START ON 05/25/2020] ALPRAZolam  0.5 mg Oral QHS  . amLODipine  2.5 mg Oral Daily  . Azelastine HCl  2 spray Each Nare Daily  . buPROPion  150 mg Oral BID  . chlorhexidine  15 mL Mouth Rinse BID  . Chlorhexidine Gluconate Cloth  6 each Topical Q0600  . docusate sodium  100 mg Oral BID  . enoxaparin (LOVENOX) injection  40 mg Subcutaneous QHS  . levothyroxine  88 mcg Oral Q0600  . lip balm      . mouth rinse  15 mL Mouth Rinse q12n4p  . mirtazapine  30 mg Oral QHS  . Racepinephrine HCl  0.5 mL Nebulization Once  . rosuvastatin  10 mg Oral Daily   Continuous Infusions: . sodium chloride 75 mL/hr at 05/24/20 0750  . fentaNYL infusion INTRAVENOUS Stopped (05/24/20 3614)  . midazolam Stopped (05/24/20 4315)  .  potassium chloride     PRN Meds:.ALPRAZolam, bisacodyl, diphenhydrAMINE **OR** diphenhydrAMINE, HYDROmorphone (DILAUDID) injection, ipratropium-albuterol, ondansetron **OR** ondansetron (ZOFRAN) IV, phenol, phenylephrine, Racepinephrine HCl, simethicone  Assessment/Plan: s/p Procedure(s): EXPLORATORY LAPAROTOMY WITH SMALL BOWEL RESECTION 06/15/20  Continue OG to suction Appreciate critical care assistance with airway issues this AM   LOS: 1 day   Quentin Ore, MD 336 [phone] [facility]

## 2020-05-24 NOTE — Procedures (Addendum)
Intubation Procedure Note  KARISSA MEENAN  [id number]  [birth year]  Date:05/24/20  Time:6:18 AM   Provider Performing:Jancie Kercher Marcos Eke    Procedure: Intubation (31500)  Indication(s) Respiratory Failure Stridor Hypoxia (92% sat on 100% NRB)  Consent Unable to obtain consent due to emergent nature of procedure.   Anesthesia Etomidate, Fentanyl and Rocuronium  Gave etomidate 10mg , fentanyl , roc 60mg  initially.    Time Out Verified patient identification, verified procedure, site/side was marked, verified correct patient position, special equipment/implants available, medications/allergies/relevant history reviewed, required imaging and test results available.   Sterile Technique Usual hand hygeine, masks, and gloves were used   Procedure Description Patient positioned in bed supine.  Sedation given as noted above.  Bag mask ventilation done due to dropping oxygen sat immediately prior to intubation (high 80s)   Patient was intubated with endotracheal tube using Glidescope.  View was Grade 1 full glottis .  Number of attempts was 1.  Colorimetric CO2 detector was consistent with tracheal placement.  Cords apposed with only very minimal opening.  6.5 ETT placed through cords easily.   Hypoxia and hypotension after intubation for about 15 min.   Breathing 40-50 Breaths per min initially (confirmed did receive rocuronium in functioning IV).  Nurse anesthetist gave additional dose of rocuronium. Neosynephrine pushes also given by nurse anesthetist Ron Cochrane who came to assist (3 pushes).  Briefly started on levophed 65mcg/min, 500ccNS.   BP improved and levophed stopped.   Hypoxia improved.   Initial vent settings 100%, Peep 8, RR 20.   I  decreased to 50%, PEEP 5, RR 16 at 630 Complications/Tolerance None; patient tolerated the procedure well. Chest X-ray is ordered to verify placement.   EBL none   Specimen(s) None

## 2020-05-24 NOTE — Progress Notes (Signed)
Initial Nutrition Assessment  DOCUMENTATION CODES:   Non-severe (moderate) malnutrition in context of chronic illness  INTERVENTION:  - will monitor for plan concerning nutrition support.  - re-weigh patient today.   NUTRITION DIAGNOSIS:   Moderate Malnutrition related to chronic illness as evidenced by mild fat depletion, mild muscle depletion, moderate muscle depletion  GOAL:   Patient will meet greater than or equal to 90% of their needs  MONITOR:   Vent status, Labs, Weight trends, Skin, Other (Comment) (nutrition support initiation)  REASON FOR ASSESSMENT:   Ventilator  ASSESSMENT:   86 y.o. female with medical history of osteoporosis and hypothyroidism. She had acute abdominal pain which started around 0400 on 12/5 and she had one episode of emesis. Pain was constant and worsening so she presented to the ED later in the day on 12/5. Patient reported recent constipation. She has surgical hx of hysterectomy, appendectomy, and bladder repair. Patient lives independently.  Patient discussed in rounds this AM.   Patient is POD #1 ex lap with small bowel resection. She was successfully extubated post-op, but developed stridor and was subsequently re-intubated today around 0610. NGT had been in place but became dislodged with re-intubation.  OGT was placed and abdominal xray obtained. This was discussed in rounds and reviewed image with RN at bedside. Tube was looped and pointing back toward esophagus so CCM had requested OGT be removed and re-inserted.   RN x2 and CCM NP attempted re-insertion of tube without success. No OGT/NGT in place at this time.  No family at bedside.   Weight 12/5 was 112 lb which is exactly the same as 9/2 and no other weights recorded in the chart.     Patient is currently intubated on ventilator support MV: 7 L/min Temp (24hrs), Avg:97.4 F (36.3 C), Min:96.6 F (35.9 C), Max:98.2 F (36.8 C) Propofol: none BP: 135/64 and MAP: 64   Labs  reviewed; Ca: 8.7 mg/dl. Medications reviewed; 100 mg colace BID, sliding scale novolog, 88 mcg oral synthroid/day, 20 mg solu-medrol x4 doses 12/6. IVF; NS @ 75 ml/hr.      NUTRITION - FOCUSED PHYSICAL EXAM:    Most Recent Value  Orbital Region Mild depletion  Upper Arm Region No depletion  Thoracic and Lumbar Region Mild depletion  Buccal Region Mild depletion  Temple Region Mild depletion  Clavicle Bone Region Moderate depletion  Clavicle and Acromion Bone Region Moderate depletion  Scapular Bone Region Moderate depletion  Dorsal Hand No depletion  Patellar Region Moderate depletion  Anterior Thigh Region Moderate depletion  Posterior Calf Region Moderate depletion  Edema (RD Assessment) None  Hair Reviewed  Eyes Unable to assess  Mouth Unable to assess  Skin Reviewed  Nails Reviewed       Diet Order:   Diet Order            Diet NPO time specified Except for: Ice Chips, Sips with Meds  Diet effective now                 EDUCATION NEEDS:   Not appropriate for education at this time  Skin:  Skin Assessment: Skin Integrity Issues: Skin Integrity Issues:: Incisions Incisions: abdomen (12/5)  Last BM:  PTA/unknown  Height:   Ht Readings from Last 1 Encounters:  05/24/20 5\' 2"  (1.575 m)    Weight:   Wt Readings from Last 1 Encounters:  06/12/2020 50.8 kg    Estimated Nutritional Needs:  Kcal:  1017 kcal Protein:  95-105 grams Fluid:  >/=  1.7 L/day     Jarome Matin, MS, RD, LDN, CNSC Inpatient Clinical Dietitian RD pager # available in [facility]  After hours/weekend pager # available in [facility]

## 2020-05-24 NOTE — Progress Notes (Signed)
On admission pt had blanchable redness on heels bilaterally approx. 1x1cm and on sacrum to l. Buttocks approx. 8x3cm. Foams placed over redness.

## 2020-05-24 NOTE — Progress Notes (Signed)
eLink Physician-Brief Progress Note Patient Name: DEONNE ROOKS DOB: 1930-08-24 MRN: [id number]   Date of Service  05/24/2020  HPI/Events of Note  Notified of recurrence of stridor. Ongoing racemic epinephrine. Received dexamethasone 2 hours ago. Looks more distressed than earlier.  eICU Interventions  Ordered another dose of dexamethasone to be given now Bedside CCM team informed of possible need for re-intubation     Intervention Category Intermediate Interventions: Respiratory distress - evaluation and management  Darl Pikes 05/24/2020, 4:55 AM

## 2020-05-24 NOTE — Progress Notes (Signed)
eLink Physician-Brief Progress Note Patient Name: [name] DOB: 1930/08/04 MRN: [id number]   Date of Service  05/24/2020  HPI/Events of Note  Notified of stridor. Patient is s/p SB resection extubated in PACU. No report of difficult intubation. Not in acute distress.  eICU Interventions  I ordered racemic epi and duonebs. CXR stat Spoke with Dr Maisie Fus and they would like PCCM consulted Informed bedside CCM team     Intervention Category Intermediate Interventions: Respiratory distress - evaluation and management Evaluation Type: New Patient Evaluation  Darl Pikes 05/24/2020, 12:00 AM

## 2020-05-24 NOTE — Transfer of Care (Signed)
Immediate Anesthesia Transfer of Care Note  Patient: Lisa Murray  Procedure(s) Performed: EXPLORATORY LAPAROTOMY WITH SMALL BOWEL RESECTION (N/A Abdomen)  Patient Location: PACU  Anesthesia Type:General  Level of Consciousness: awake, alert , oriented and patient cooperative  Airway & Oxygen Therapy: Patient Spontanous Breathing and Patient connected to face mask  Post-op Assessment: Report given to RN and Post -op Vital signs reviewed and stable  Post vital signs: Reviewed and stable  Last Vitals:  Vitals Value Taken Time  BP 142/95 05/24/20 0608  Temp 36.8 C 05/24/20 0130  Pulse 121 05/24/20 0553  Resp 20 05/24/20 0609  SpO2 85 % 05/24/20 0553  Vitals shown include unvalidated device data.  Last Pain:  Vitals:   05/24/20 0227  TempSrc:   PainSc: 10-Worst pain ever         Complications: No complications documented.

## 2020-05-24 NOTE — Consult Note (Addendum)
NAME:  Lisa Murray, MRN:  [id number], DOB:  March 17, 1931, LOS: 1 ADMISSION DATE:  2020-05-29, CONSULTATION DATE:  May 29, 2020 REFERRING MD:  Romie Levee, CHIEF COMPLAINT:  Stridor  Brief History   Lisa Murray is an 86 year old woman with a history of hypothyroidism, osteoporosis, here with acute abdominal pain, closed loop small bowel obstruction.   s/p Ex lap and LOA   History of present illness   Per Surgeon's note, acute abdominal pain 12/5 at 4am, with emesis.  Recent constipation.  Hx of abdominal hysterectomy and bladder repair.   S/p LOA and ex lap, Hemorrhagic ascites, questionably viable bowel. SB resection and primary anastamosis.  Adhesive band lysed.    Upon arrival from the OR, patient was exhibiting stridor.  O2 sat 100% on room air.  Experiencing throat pain. Stridor improved after racemic epi, duonebs   Past Medical History  Hx of Fall, requiring hospitalization and rehab Hypothyroidism Osteoporosis HTN S/p ab hysterectomy, bladder repair Appendectomy   Significant Hospital Events    S.p ex lap, LOA Consults:    Procedures:    Significant Diagnostic Tests:  CT abd/pelv 1. Features of high-grade small bowel obstruction a closed loop in the right lower quadrant demonstrating dilatation, thickening and adjacent inflammation and mural hypoattenuation concerning for vascular compromise. Possibly attributable to a post surgical adhesion given history of hysterectomy and appendectomy. 2. Delayed right renal nephrogram with moderate hydroureteronephrosis with the distal ureter deviated towards the right lower quadrant and possibly involved by the obstructive process in the right lower quadrant. 3. Mild mural thickening of the cecum though this is possibly reactive. 4. Large hiatal hernia with a degree of likely organoaxial twisting with the pylorus remaining below the level of the diaphragm. Distended appearance of the proximal stomach could reflect some partial  obstruction 5. Unchanged compression deformities and vertebral body height loss involving the L1, L2 and L4 levels. 6. Aortic Atherosclerosis (ICD10-I70.0).   CXR: parenchyma appears normal, hiatal hernia, NG in place.   Micro Data:    Antimicrobials:    Interim history/subjective:    Objective   Blood pressure 114/76, pulse 97, temperature (!) 97.1 F (36.2 C), temperature source Axillary, resp. rate 17, height 5\' 2"  (1.575 m), weight 50.8 kg, SpO2 100 %.        Intake/Output Summary (Last 24 hours) at 05/24/2020 0153 Last data filed at 05-29-20 2302 Gross per 24 hour  Intake 3300 ml  Output 195 ml  Net 3105 ml   Filed Weights   05/29/2020 1648 05-29-2020 1707  Weight: 50.8 kg 50.8 kg    Examination: General: Frail and weak appearing, speaking audibly  with some effort, Stridor, not labored.   HENT: NCAT, NG tube, mmm Lungs: Clear no wheezing or crackles  Cardiovascular: RRR mp mgr Abdomen:incision C/d/i, mildly tender to palp, BS present Extremities: no edema Neuro: awake alert,    Resolved Hospital Problem list     Assessment & Plan:  86 yo woman SBO, s/p ex lap, consult called for post op stridor.   Stridor: post op, post intubation stridor.   Responding well to racemic epi, nebs.   Cont racemic epi tonight, decadron 4mg  x 1, voice rest.  NPO, except anxiolytics as needed (try xanax, which is on her home meds list).   Pain control as needed (dilaudid ordered).   Discussed with Dr. 97.   SBO: surgical management.   NG tube.   Hypothyroidism:  Cont daily levothyroxine.    Best practice (evaluated daily)  Diet: NPO for now  Pain/Anxiety/Delirium protocol (if indicated):  VAP protocol (if indicated):  DVT prophylaxis: lovenox ordered  GI prophylaxis: protonix Glucose control:  Mobility:  last date of multidisciplinary goals of care discussion Family and staff present  Summary of discussion  Follow up goals of care discussion due Code  Status: Full  Disposition: ICU   Labs   CBC: Recent Labs  Lab May 24, 2020 1721 2020-05-24 2151  WBC 21.0*  --   NEUTROABS 18.7*  --   HGB 14.9 9.2*  HCT 46.4* 27.0*  MCV 98.7  --   PLT 265  --     Basic Metabolic Panel: Recent Labs  Lab 05/24/2020 1721 May 24, 2020 2151  NA 141 143  K 3.7 3.6  CL 103  --   CO2 23  --   GLUCOSE 187*  --   BUN 12  --   CREATININE 0.90  --   CALCIUM 10.3  --    GFR: Estimated Creatinine Clearance: 34.8 mL/min (by C-G formula based on SCr of 0.9 mg/dL). Recent Labs  Lab 2020-05-24 1721 May 24, 2020 1903  WBC 21.0*  --   LATICACIDVEN 3.0* 3.4*    Liver Function Tests: Recent Labs  Lab 05/24/2020 1721  AST 26  ALT 17  ALKPHOS 48  BILITOT 0.7  PROT 7.3  ALBUMIN 4.0   Recent Labs  Lab 2020/05/24 1721  LIPASE 21   No results for input(s): AMMONIA in the last 168 hours.  ABG    Component Value Date/Time   PHART 7.273 (L) 05/24/20 2151   PCO2ART 45.9 2020/05/24 2151   PO2ART 477 (H) 05-24-2020 2151   HCO3 21.6 05/24/20 2151   TCO2 23 24-May-2020 2151   ACIDBASEDEF 5.0 (H) 24-May-2020 2151   O2SAT 100.0 05/24/20 2151     Coagulation Profile: No results for input(s): INR, PROTIME in the last 168 hours.  Cardiac Enzymes: No results for input(s): CKTOTAL, CKMB, CKMBINDEX, TROPONINI in the last 168 hours.  HbA1C: No results found for: HGBA1C  CBG: No results for input(s): GLUCAP in the last 168 hours.  Review of Systems:   Unable to assess, patient is fatigued  Past Medical History  She,  has a past medical history of Dizziness (02/27/2020), Hypothyroidism (02/27/2020), and Senile osteoporosis (02/27/2020).   Surgical History    Past Surgical History:  Procedure Laterality Date  . ABDOMINAL HYSTERECTOMY    . APPENDECTOMY    . BLADDER REPAIR       Social History   reports that she has never smoked. She has never used smokeless tobacco. She reports that she does not drink alcohol.   Family History   Her family history  is not on file.   Allergies Allergies  Allergen Reactions  . Iodine Hives  . Codeine Nausea And Vomiting  . Doxycycline Nausea And Vomiting  . Fosamax [Alendronate] Nausea And Vomiting  . Macrobid [Nitrofurantoin] Nausea And Vomiting  . Tramadol Nausea And Vomiting  . Erythromycin Rash  . Morphine And Related Anxiety  . Novocain [Procaine] Anxiety  . Penicillin G Benzathine Rash  . Sulfa Antibiotics Rash     Home Medications  Prior to Admission medications   Medication Sig Start Date End Date Taking? Authorizing Provider  acetaminophen (TYLENOL) 650 MG CR tablet Take 650 mg by mouth every 6 (six) hours as needed for pain.    [provider]  ALPRAZolam Prudy Feeler) 0.25 MG tablet Take 1-2 tablets (0.25-0.5 mg total) by mouth See admin instructions. Take 1 tablet (0.25  mg) in the morning and take 2 tablet (0.50mg )at night 03/15/20   Ngetich, Dinah C, NP  amLODipine (NORVASC) 2.5 MG tablet Take 1 tablet (2.5 mg total) by mouth daily. 03/15/20   Ngetich, Dinah C, NP  Azelastine HCl 0.15 % SOLN Place 2 sprays into both nostrils daily. 03/15/20   Ngetich, Dinah C, NP  bisacodyl (DULCOLAX) 10 MG suppository Place 10 mg rectally as needed for moderate constipation.    [provider]  buPROPion (WELLBUTRIN SR) 150 MG 12 hr tablet Take 1 tablet (150 mg total) by mouth 2 (two) times daily. 03/15/20   Ngetich, Dinah C, NP  docusate sodium (COLACE) 100 MG capsule Take 1 capsule (100 mg total) by mouth 2 (two) times daily. 02/24/20   Khatri, Hina, PA-C  guaiFENesin (MUCINEX) 600 MG 12 hr tablet Take by mouth 2 (two) times daily. 1 tablet by mouth every 12 hours as needed for mucous secretions.    [provider]  Magnesium Hydroxide (MILK OF MAGNESIA PO) Take 30 mLs by mouth as needed.    [provider]  Menthol, Topical Analgesic, (BIOFREEZE) 4 % GEL Apply topically.    [provider]  mirtazapine (REMERON SOL-TAB) 30 MG disintegrating tablet Take 1 tablet (30  mg total) by mouth at bedtime. 03/15/20   Ngetich, Dinah C, NP  Omega-3 Fatty Acids (FISH OIL PO) Take 1 capsule by mouth daily.    [provider]  polyethylene glycol (MIRALAX) 17 g packet Take 17 g by mouth daily as needed. 02/24/20   Khatri, Hina, PA-C  Psyllium (METAMUCIL FIBER PO) Take by mouth. Mix 1 packet with 8 oz of water, and drink daily for constipation. Hold for loose stool    [provider]  rosuvastatin (CRESTOR) 10 MG tablet Take 1 tablet (10 mg total) by mouth daily. 03/15/20   Ngetich, Dinah C, NP  Sodium Phosphates (RA SALINE ENEMA RE) Place rectally as needed.    [provider]  SYNTHROID 88 MCG tablet Take 1 tablet (88 mcg total) by mouth every morning. 03/15/20   Ngetich, Dinah C, NP  VITAMIN D PO Take 2 capsules by mouth daily.    [provider]     Critical care time:  40 min

## 2020-05-24 NOTE — TOC Initial Note (Signed)
Transition of Care [facility]) - Initial/Assessment Note    Patient Details  Name: Lisa Murray MRN: [id number] Date of Birth: Oct 30, 1930  [facility]) CM/SW Contact:    Golda Acre, RN Phone Number: 05/24/2020, 8:52 AM  Clinical Narrative:                 Re-intubated this morning after experiencing stridor post-op.  NG dislodged with intubation so OG now in place. S/p Small bowel obstruction  POST-OPERATIVE DIAGNOSIS:  Closed loop small bowel obstruction  PROCEDURE:  EXPLORATORY LAPAROTOMY, SMALL BOWEL RESECTION  Expected Discharge Plan: Home/Self Care Barriers to Discharge: No Barriers Identified   Patient Goals and CMS Choice        Expected Discharge Plan and Services Expected Discharge Plan: Home/Self Care   Discharge Planning Services: CM Consult   Living arrangements for the past 2 months: Single Family Home                                      Prior Living Arrangements/Services Living arrangements for the past 2 months: Single Family Home Lives with:: Self                   Activities of Daily Living      Permission Sought/Granted                  Emotional Assessment              Admission diagnosis:  Small bowel obstruction (HCC) [K56.609] SBO (small bowel obstruction) (HCC) [K56.609] Patient Active Problem List   Diagnosis Date Noted  . Small bowel obstruction (HCC) 06/08/2020  . SBO (small bowel obstruction) (HCC) 05/31/2020  . Impaired mobility and ADLs 02/27/2020  . Frequent falls 02/27/2020  . Hypothyroidism 02/27/2020  . Senile osteoporosis 02/27/2020  . Dizziness 02/27/2020  . Slow transit constipation 02/27/2020  . Multiple rib fractures 02/19/2020   PCP:  Mila Palmer, MD Pharmacy:   [facility] [phone] - [place], [place] - [address] AT CORNER OF [facility] [address] [place] [address] Phone: [phone] Fax: [phone]  [facility]  - [place], [place] - [place] [address] [place] [address] Phone: [phone] Fax: [phone]     Social Determinants of Health (SDOH) Interventions    Readmission Risk Interventions No flowsheet data found.

## 2020-05-24 NOTE — Progress Notes (Signed)
Notably worsening stridor after given racenephrine. New onset of atrial fibrillation. E-link notified. Prepping for intubation.

## 2020-05-25 ENCOUNTER — Inpatient Hospital Stay (HOSPITAL_COMMUNITY): Payer: PPO

## 2020-05-25 DIAGNOSIS — E440 Moderate protein-calorie malnutrition: Secondary | ICD-10-CM | POA: Diagnosis present

## 2020-05-25 DIAGNOSIS — Z9911 Dependence on respirator [ventilator] status: Secondary | ICD-10-CM

## 2020-05-25 LAB — CBC
HCT: 23.4 % — ABNORMAL LOW (ref 36.0–46.0)
HCT: 23.2 % — ABNORMAL LOW (ref 36.0–46.0)
Hemoglobin: 7.6 g/dL — ABNORMAL LOW (ref 12.0–15.0)
Hemoglobin: 7.4 g/dL — ABNORMAL LOW (ref 12.0–15.0)
MCH: 32.3 pg (ref 26.0–34.0)
MCH: 32.3 pg (ref 26.0–34.0)
MCHC: 32.5 g/dL (ref 30.0–36.0)
MCHC: 31.9 g/dL (ref 30.0–36.0)
MCV: 99.6 fL (ref 80.0–100.0)
MCV: 101.3 fL — ABNORMAL HIGH (ref 80.0–100.0)
Platelets: 133 10*3/uL — ABNORMAL LOW (ref 150–400)
Platelets: 135 10*3/uL — ABNORMAL LOW (ref 150–400)
RBC: 2.35 MIL/uL — ABNORMAL LOW (ref 3.87–5.11)
RBC: 2.29 MIL/uL — ABNORMAL LOW (ref 3.87–5.11)
RDW: 14.5 % (ref 11.5–15.5)
RDW: 14.6 % (ref 11.5–15.5)
WBC: 16.2 10*3/uL — ABNORMAL HIGH (ref 4.0–10.5)
WBC: 13.6 10*3/uL — ABNORMAL HIGH (ref 4.0–10.5)
nRBC: 0 % (ref 0.0–0.2)
nRBC: 0 % (ref 0.0–0.2)

## 2020-05-25 LAB — GLUCOSE, CAPILLARY
Glucose-Capillary: 103 mg/dL — ABNORMAL HIGH (ref 70–99)
Glucose-Capillary: 75 mg/dL (ref 70–99)
Glucose-Capillary: 86 mg/dL (ref 70–99)
Glucose-Capillary: 89 mg/dL (ref 70–99)
Glucose-Capillary: 94 mg/dL (ref 70–99)
Glucose-Capillary: 97 mg/dL (ref 70–99)
Glucose-Capillary: 98 mg/dL (ref 70–99)

## 2020-05-25 LAB — BASIC METABOLIC PANEL
Anion gap: 9 (ref 5–15)
BUN: 25 mg/dL — ABNORMAL HIGH (ref 8–23)
CO2: 21 mmol/L — ABNORMAL LOW (ref 22–32)
Calcium: 8.7 mg/dL — ABNORMAL LOW (ref 8.9–10.3)
Chloride: 112 mmol/L — ABNORMAL HIGH (ref 98–111)
Creatinine, Ser: 0.84 mg/dL (ref 0.44–1.00)
GFR, Estimated: >60 mL/min (ref >60)
Glucose, Bld: 109 mg/dL — ABNORMAL HIGH (ref 70–99)
Potassium: 3.9 mmol/L (ref 3.5–5.1)
Sodium: 142 mmol/L (ref 135–145)

## 2020-05-25 LAB — URINE CULTURE: Culture: <10000 — AB

## 2020-05-25 LAB — PROTIME-INR
INR: 1.6 — ABNORMAL HIGH (ref 0.8–1.2)
Prothrombin Time: 18.4 seconds — ABNORMAL HIGH (ref 11.4–15.2)

## 2020-05-25 LAB — ABO/RH: ABO/RH(D): A POS

## 2020-05-25 LAB — TYPE AND SCREEN
ABO/RH(D): A POS
Antibody Screen: NEGATIVE

## 2020-05-25 LAB — SURGICAL PATHOLOGY

## 2020-05-25 MED ORDER — ALBUMIN HUMAN 25 % IV SOLN
25.0000 g | Freq: Once | INTRAVENOUS | Status: AC
  Administered 2020-05-25: 25 g via INTRAVENOUS
  Filled 2020-05-25: qty 100

## 2020-05-25 MED ORDER — NOREPINEPHRINE 4 MG/250ML-% IV SOLN
0.0000 ug/min | INTRAVENOUS | Status: DC
Start: 2020-05-25 — End: 2020-05-25

## 2020-05-25 MED ORDER — SODIUM CHLORIDE 0.9 % IV SOLN: 250.0000 mL | INTRAVENOUS | Status: DC

## 2020-05-25 MED ORDER — NOREPINEPHRINE 4 MG/250ML-% IV SOLN
0.0000 ug/min | INTRAVENOUS | Status: DC
  Administered 2020-05-25: 2 ug/min via INTRAVENOUS
  Filled 2020-05-25: qty 250

## 2020-05-25 MED ORDER — PANTOPRAZOLE SODIUM 40 MG IV SOLR
40.0000 mg | Freq: Every day | INTRAVENOUS | Status: DC
  Administered 2020-05-25: 40 mg via INTRAVENOUS
  Filled 2020-05-25: qty 40

## 2020-05-25 MED ORDER — NOREPINEPHRINE 4 MG/250ML-% IV SOLN
2.0000 ug/min | INTRAVENOUS | Status: AC
  Administered 2020-05-27: 2 ug/min via INTRAVENOUS
  Filled 2020-05-25: qty 250

## 2020-05-25 NOTE — Progress Notes (Signed)
eLink Physician-Brief Progress Note Patient Name: Lisa Murray DOB: 01-31-1931 MRN: [id number]   Date of Service  05/25/2020  HPI/Events of Note  Blood pressure remains low without improvement with albumin  eICU Interventions  Will start low dose norepinephrine     Intervention Category Major Interventions: Hypotension - evaluation and management  Darl Pikes 05/25/2020, 3:09 AM

## 2020-05-25 NOTE — Progress Notes (Signed)
eLink Physician-Brief Progress Note Patient Name: Lisa Murray DOB: [birth year] MRN: [id number]   Date of Service  05/25/2020  HPI/Events of Note  Notified of BP 81/49 Urine output 200 cc for the past 5 hours On NS 75/hr Sedation titrated down and patient seen awake  eICU Interventions  Ordered albumin 25 g Refer back to eLink if BP remains low     Intervention Category Major Interventions: Hypotension - evaluation and management  Darl Pikes 05/25/2020, 12:26 AM

## 2020-05-25 NOTE — Progress Notes (Signed)
eLink Physician-Brief Progress Note Patient Name: Lisa Murray DOB: Apr 13, 1931 MRN: [id number]   Date of Service  05/25/2020  HPI/Events of Note  Request for am labs  eICU Interventions  CBC, BMP ordered     Intervention Category Minor Interventions: Other:  Darl Pikes 05/25/2020, 4:56 AM

## 2020-05-25 NOTE — Progress Notes (Signed)
Progress Note: General Surgery Service   Chief Complaint/Subjective: Intubated, sedated in ICU.  NG tube unable to be replaced by nursing staff.  I attempted this morning on rounds and was unable to place - likely due to large hiatal hernia.  Some episodes of hypotension overnight - started on levophed.  Objective: Vital signs in last 24 hours: Temp:  [98.7 F (37.1 C)-99.6 F (37.6 C)] 99.5 F (37.5 C) (12/07 0400) Pulse Rate:  [86-136] 96 (12/07 0700) Resp:  [15-28] 16 (12/07 0700) BP: (71-121)/(43-59) 92/48 (12/07 0423) SpO2:  [94 %-100 %] 96 % (12/07 0700) Arterial Line BP: (78-158)/(45-84) 115/56 (12/07 0700) FiO2 (%):  [30 %-40 %] 30 % (12/07 0700) Last BM Date:  (PTA)  Intake/Output from previous day: 12/06 0701 - 12/07 0700 In: 2166.6 [I.V.:1678.7; IV Piggyback:488] Out: 775 [Urine:775] Intake/Output this shift: No intake/output data recorded.  Gen: Sedated, ventilated  Resp: Ventilated, bilateral breath sounds,   Card: tachycardic, regular rhythm with PVCs this AM  Abd: Soft, incision c/d/i w/ dressing, non-distended  Lab Results: CBC  Recent Labs    05/24/20 1118 05/25/20 0531  WBC 17.0* 16.2*  HGB 10.9* 7.6*  HCT 33.3* 23.4*  PLT 163 133*   BMET Recent Labs    05/24/20 0239 05/25/20 0531  NA 140 142  K 3.6 3.9  CL 108 112*  CO2 22 21*  GLUCOSE 165* 109*  BUN 13 25*  CREATININE 0.76 0.84  CALCIUM 8.7* 8.7*   PT/INR No results for input(s): LABPROT, INR in the last 72 hours. ABG Recent Labs    2020/06/20 2151 05/24/20 0558  PHART 7.273* 7.355  HCO3 21.6 21.2    Anti-infectives: Anti-infectives (From admission, onward)   Start     Dose/Rate Route Frequency Ordered Stop   2020-06-20 2100  cefoTEtan (CEFOTAN) 1 g in sodium chloride 0.9 % 100 mL IVPB        1 g 200 mL/hr over 30 Minutes Intravenous On call to O.R. 06/20/2020 2008 06-20-2020 2112      Medications: Scheduled Meds: . acetaminophen  1,000 mg Oral Q6H  . ALPRAZolam  0.25 mg  Oral Daily   And  . ALPRAZolam  0.5 mg Oral QHS  . chlorhexidine  15 mL Mouth Rinse BID  . Chlorhexidine Gluconate Cloth  6 each Topical Q0600  . docusate sodium  100 mg Oral BID  . enoxaparin (LOVENOX) injection  40 mg Subcutaneous QHS  . insulin aspart  2-6 Units Subcutaneous Q4H  . levothyroxine  88 mcg Oral Q0600  . mouth rinse  15 mL Mouth Rinse q12n4p  . mirtazapine  30 mg Oral QHS  . rosuvastatin  10 mg Oral Daily   Continuous Infusions: . sodium chloride 75 mL/hr at 05/25/20 0700  . sodium chloride    . fentaNYL infusion INTRAVENOUS 100 mcg/hr (05/25/20 0700)  . midazolam 0.5 mg/hr (05/25/20 0700)  . norepinephrine (LEVOPHED) Adult infusion 3 mcg/min (05/25/20 0700)   PRN Meds:.ALPRAZolam, bisacodyl, diphenhydrAMINE **OR** diphenhydrAMINE, ipratropium-albuterol, ondansetron **OR** ondansetron (ZOFRAN) IV, phenol, phenylephrine, simethicone  Assessment/Plan: Lisa Murray is a 86 year old female who presented with abdominal pain found to have a closed loop small bowel obstruction secondary to an adhesive band s/p exploratory laparotomy with small bowel resection on 2020-06-20 with Dr. Maisie Fus.  Adhesive closed loop SBO s/p sx as above - Consult IR for NG tube placement - unable to place bedside due to large hiatal hernia. - Pain control - Awaiting return of bowel function  Lactic acidosis 2/2  ischemic bowel - Cleared on follow up lactic acid level 12/6  Acute blood loss anemia combined with dilutional anemia - HGB 7.6 from 10.9, with new hypotension requiring levophed and hemoglobin drop, check CTA abd/pelvis for evidence of bleeding, - Recheck HGB at noon. - Type and screen  Non-severe moderate degree of malnutrition, present on admission - Anticipating starting enteral nutrition within 7 days of surgery but may need TPN  Post-extubation stridor requiring reintubation - Appreciate critical care team's assistance  - Using smaller ET tube and 24 hrs of  steroids  Hypothyroidism - home medications  Hyperglycemia due to acute illness - SSI  FEN: IVF ID: none VTE: SCDs, hold lovenox (will likely need lower dose) Foley: Continue Dispo: Continued ICU care    LOS: 2 days   Quentin Ore, MD [phone] [facility]

## 2020-05-25 NOTE — Progress Notes (Signed)
Pt transferred to IR for NG placement, radiologist unable to place NG, Dr Dossie Der and Dr Chestine Spore notified

## 2020-05-25 NOTE — Progress Notes (Signed)
At 00:15 patient's SBP 84/48 informed Elink and ordered Albumin and after infusion SBP 87/49. At 3:31 SBP 78/48, but patient is awake and restless, HR go up to 130's. Informed E-link and ordered and started Levophed. SBP improved and was able to adjust sedation for patient's comfort and RASS goal. Will continue to monitor patient and update status.

## 2020-05-25 NOTE — Addendum Note (Signed)
Addendum  created 05/25/20 1218 by Kipp Brood, MD   Clinical Note Signed

## 2020-05-25 NOTE — Progress Notes (Signed)
Anesthesiology Follow-up:  As noted 86 year old female underwent exploratory lap and small bowel resection for closed loop bowel obstruction on 06/09/2020. She was extubated in OR  but was re-intubated  7 hours post-op for stridor and respiratory distress. Vocal cord edema noted at intubation.   Patient is now sedated on vent on fentanyl infusion, versed infusion paused. On low dose levophed for BP support, respiratory parameters look good. CXR shows clear lung fields with ETT in good position and large hiatal hernia.   VS: T-37.3 BP- 107/53 HR- 87 O2 Sat 96%  TV: 400 FiO2: 0.30 RR- 15 PIP-18 PEEP- 5  Na-142 K-3.9 BUN/Cr.-25/0.84 H/H-7.4/23.2 WBC-13,600 Plts-135,000   Post-op anemia noted, Hemoglobin stable over last 6 hours. NG tube to be placed by Radiology  Hopefully patient can be extubated soon. Appreciate care from CCM.  Kipp Brood

## 2020-05-25 NOTE — Progress Notes (Signed)
NAME:  Lisa Murray, MRN:  [id number], DOB:  05/11/1931, LOS: 2 ADMISSION DATE:  05/30/2020, CONSULTATION DATE:  05/31/2020 REFERRING MD:  Romie Levee, CHIEF COMPLAINT:  Stridor  Brief History   [name] is an 86 year old woman with a history of hypothyroidism, osteoporosis, here with acute abdominal pain, closed loop small bowel obstruction.   s/p Ex lap and LOA   History of present illness   Per Surgeon's note, acute abdominal pain 12/5 at 4am, with emesis.  Recent constipation.  Hx of abdominal hysterectomy and bladder repair.   S/p LOA and ex lap, Hemorrhagic ascites, questionably viable bowel. SB resection and primary anastamosis.  Adhesive band lysed.    Upon arrival from the OR, patient was exhibiting stridor.  O2 sat 100% on room air.  Experiencing throat pain. Stridor improved after racemic epi, duonebs   Past Medical History  Hx of Fall, requiring hospitalization and rehab Hypothyroidism Osteoporosis HTN S/p ab hysterectomy, bladder repair Appendectomy   Significant Hospital Events   S.p ex lap, LOA Postop stridor requiring intubation  Consults:  PCCM  Procedures:  Ex-lap with LOA, small bowel resection with primary anastamosis  Significant Diagnostic Tests:  CT abd/pelv 1. Features of high-grade small bowel obstruction a closed loop in the right lower quadrant demonstrating dilatation, thickening and adjacent inflammation and mural hypoattenuation concerning for vascular compromise. Possibly attributable to a post surgical adhesion given history of hysterectomy and appendectomy. 2. Delayed right renal nephrogram with moderate hydroureteronephrosis with the distal ureter deviated towards the right lower quadrant and possibly involved by the obstructive process in the right lower quadrant. 3. Mild mural thickening of the cecum though this is possibly reactive. 4. Large hiatal hernia with a degree of likely organoaxial twisting with the pylorus remaining  below the level of the diaphragm. Distended appearance of the proximal stomach could reflect some partial obstruction 5. Unchanged compression deformities and vertebral body height loss involving the L1, L2 and L4 levels. 6. Aortic Atherosclerosis (ICD10-I70.0).   CXR: parenchyma appears normal, hiatal hernia, NG in place.   05/25/2020 no chest x-ray  Micro Data:    Antimicrobials:    Interim history/subjective:  Remains intubated and is for IR procedure today therefore no attempted extubation at this time  Objective   Blood pressure (!) 92/48, pulse 93, temperature 99.5 F (37.5 C), temperature source Axillary, resp. rate 16, height 5\' 2"  (1.575 m), weight 50.5 kg, SpO2 96 %.    Vent Mode: PRVC FiO2 (%):  [30 %-40 %] 30 % Set Rate:  [16 bmp] 16 bmp Vt Set:  [400 mL] 400 mL PEEP:  [5 cmH20] 5 cmH20 Plateau Pressure:  [13 cmH20-15 cmH20] 15 cmH20   Intake/Output Summary (Last 24 hours) at 05/25/2020 14/12/2019 Last data filed at 05/25/2020 0700 Gross per 24 hour  Intake 2166.63 ml  Output 775 ml  Net 1391.63 ml   Filed Weights   06/08/2020 1648 05/21/2020 1707 05/24/20 0500  Weight: 50.8 kg 50.8 kg 50.5 kg    Examination: General: Frail elderly female sedated on full mechanical ventilatory support HEENT: Endotracheal tube is in place Neuro: When not sedated moves all extremities CV: Heart sounds are regular PULM: Decreased breath sounds in the bases Vent pressure regulated volume control FIO2 30% PEEP 5 RATE 16 VT 400  GI: Surgical wound with dressing intact no bowel sound GU: Amber urine Extremities: warm/dry, negative edema  Skin: no rashes or lesions    Resolved Hospital Problem list  Assessment & Plan:   Post-extubation stridor requiring reintubation  24 hours of steroid No extubation this a.m. she is going to interventional radiology for gastric tube placement She has a small tube 6.5 in place Sedate as necessary Vent bundle Evaluate for extubation  per protocol  Hypotension the setting of sedation for tube tolerance Low-dose Levophed   SBO, s/p LOA and partial resection Per general surgery She will need gastric tube placed by IR on 05/26/2019  Hypothyroidism Continue replacement  Hypergylcemia, due to acute illness CBG (last 3)  Recent Labs    05/25/20 0049 05/25/20 0420 05/25/20 0755  GLUCAP 89 103* 97    Sliding scale insulin protocol  Acute anemia, likely dilutional and due to operative blood loss Recent Labs    05/24/20 1118 05/25/20 0531  HGB 10.9* 7.6*    Transfuse for hemoglobin less than 7  Lactic acid due to ischemic bowel Lactic acid 1.5 on 05/24/2020  Best practice (evaluated daily)   Diet: NPO Pain/Anxiety/Delirium protocol (if indicated): yes VAP protocol (if indicated): yes DVT prophylaxis: lovenox  GI prophylaxis: protonix Glucose control: SSI Mobility: progressive last date of multidisciplinary goals of care discussion Family and staff present  Summary of discussion  Follow up goals of care discussion due Code Status: Full  Disposition: ICU  05/25/20 no family at bedside  Labs   CBC: Recent Labs  Lab 06/18/2020 1721 06/01/2020 2151 05/24/20 0239 05/24/20 1118 05/25/20 0531  WBC 21.0*  --  10.4 17.0* 16.2*  NEUTROABS 18.7*  --   --   --   --   HGB 14.9 9.2* 10.9* 10.9* 7.6*  HCT 46.4* 27.0* 34.2* 33.3* 23.4*  MCV 98.7  --  100.9* 99.4 99.6  PLT 265  --  185 163 133*    Basic Metabolic Panel: Recent Labs  Lab 06/11/2020 1721 05/22/2020 2151 05/24/20 0239 05/25/20 0531  NA 141 143 140 142  K 3.7 3.6 3.6 3.9  CL 103  --  108 112*  CO2 23  --  22 21*  GLUCOSE 187*  --  165* 109*  BUN 12  --  13 25*  CREATININE 0.90  --  0.76 0.84  CALCIUM 10.3  --  8.7* 8.7*   GFR: Estimated Creatinine Clearance: 37.3 mL/min (by C-G formula based on SCr of 0.84 mg/dL). Recent Labs  Lab 06/03/2020 1721 06/02/2020 1903 05/24/20 0239 05/24/20 1118 05/25/20 0531  WBC 21.0*  --  10.4 17.0*  16.2*  LATICACIDVEN 3.0* 3.4*  --  1.5  --     Liver Function Tests: Recent Labs  Lab 06/11/2020 1721  AST 26  ALT 17  ALKPHOS 48  BILITOT 0.7  PROT 7.3  ALBUMIN 4.0   Recent Labs  Lab 06/12/2020 1721  LIPASE 21   No results for input(s): AMMONIA in the last 168 hours.  ABG    Component Value Date/Time   PHART 7.355 05/24/2020 0558   PCO2ART 38.5 05/24/2020 0558   PO2ART 431 (H) 05/24/2020 0558   HCO3 21.2 05/24/2020 0558   TCO2 23 06/08/2020 2151   ACIDBASEDEF 3.6 (H) 05/24/2020 0558   O2SAT 100.0 05/24/2020 0558     Coagulation Profile: No results for input(s): INR, PROTIME in the last 168 hours.  Cardiac Enzymes: No results for input(s): CKTOTAL, CKMB, CKMBINDEX, TROPONINI in the last 168 hours.  HbA1C: No results found for: HGBA1C  CBG: Recent Labs  Lab 05/24/20 1732 05/24/20 1919 05/25/20 0049 05/25/20 0420 05/25/20 0755  GLUCAP 82 94 89 103*  97     App cct 34 min  Brett Canales Akyla Vavrek ACNP Acute Care Nurse Practitioner [facility] Please consult Amion 05/25/2020, 8:52 AM

## 2020-05-26 ENCOUNTER — Inpatient Hospital Stay: Payer: Self-pay

## 2020-05-26 ENCOUNTER — Inpatient Hospital Stay (HOSPITAL_COMMUNITY): Payer: PPO

## 2020-05-26 DIAGNOSIS — L8990 Pressure ulcer of unspecified site, unspecified stage: Secondary | ICD-10-CM | POA: Insufficient documentation

## 2020-05-26 DIAGNOSIS — D649 Anemia, unspecified: Secondary | ICD-10-CM

## 2020-05-26 LAB — CBC WITH DIFFERENTIAL/PLATELET
Abs Immature Granulocytes: 0.08 10*3/uL — ABNORMAL HIGH (ref 0.00–0.07)
Basophils Absolute: 0 10*3/uL (ref 0.0–0.1)
Basophils Relative: 0 %
Eosinophils Absolute: 0 10*3/uL (ref 0.0–0.5)
Eosinophils Relative: 0 %
HCT: 21.7 % — ABNORMAL LOW (ref 36.0–46.0)
Hemoglobin: 7 g/dL — ABNORMAL LOW (ref 12.0–15.0)
Immature Granulocytes: 1 %
Lymphocytes Relative: 8 %
Lymphs Abs: 0.8 10*3/uL (ref 0.7–4.0)
MCH: 32.4 pg (ref 26.0–34.0)
MCHC: 32.3 g/dL (ref 30.0–36.0)
MCV: 100.5 fL — ABNORMAL HIGH (ref 80.0–100.0)
Monocytes Absolute: 0.4 10*3/uL (ref 0.1–1.0)
Monocytes Relative: 4 %
Neutro Abs: 7.7 10*3/uL (ref 1.7–7.7)
Neutrophils Relative %: 87 %
Platelets: 112 10*3/uL — ABNORMAL LOW (ref 150–400)
RBC: 2.16 MIL/uL — ABNORMAL LOW (ref 3.87–5.11)
RDW: 14.6 % (ref 11.5–15.5)
WBC: 8.9 10*3/uL (ref 4.0–10.5)
nRBC: 0 % (ref 0.0–0.2)

## 2020-05-26 LAB — COMPREHENSIVE METABOLIC PANEL
ALT: 32 U/L (ref 0–44)
AST: 81 U/L — ABNORMAL HIGH (ref 15–41)
Albumin: 3.4 g/dL — ABNORMAL LOW (ref 3.5–5.0)
Alkaline Phosphatase: 64 U/L (ref 38–126)
Anion gap: 12 (ref 5–15)
BUN: 18 mg/dL (ref 8–23)
CO2: 21 mmol/L — ABNORMAL LOW (ref 22–32)
Calcium: 9 mg/dL (ref 8.9–10.3)
Chloride: 108 mmol/L (ref 98–111)
Creatinine, Ser: 0.75 mg/dL (ref 0.44–1.00)
GFR, Estimated: >60 mL/min (ref >60)
Glucose, Bld: 157 mg/dL — ABNORMAL HIGH (ref 70–99)
Potassium: 5.5 mmol/L — ABNORMAL HIGH (ref 3.5–5.1)
Sodium: 141 mmol/L (ref 135–145)
Total Bilirubin: 2.9 mg/dL — ABNORMAL HIGH (ref 0.3–1.2)
Total Protein: 6.4 g/dL — ABNORMAL LOW (ref 6.5–8.1)

## 2020-05-26 LAB — BASIC METABOLIC PANEL
Anion gap: 9 (ref 5–15)
BUN: 25 mg/dL — ABNORMAL HIGH (ref 8–23)
CO2: 19 mmol/L — ABNORMAL LOW (ref 22–32)
Calcium: 8.6 mg/dL — ABNORMAL LOW (ref 8.9–10.3)
Chloride: 116 mmol/L — ABNORMAL HIGH (ref 98–111)
Creatinine, Ser: 0.6 mg/dL (ref 0.44–1.00)
GFR, Estimated: >60 mL/min (ref >60)
Glucose, Bld: 75 mg/dL (ref 70–99)
Potassium: 3.2 mmol/L — ABNORMAL LOW (ref 3.5–5.1)
Sodium: 144 mmol/L (ref 135–145)

## 2020-05-26 LAB — BLOOD GAS, VENOUS
Acid-base deficit: 2 mmol/L (ref 0.0–2.0)
Bicarbonate: 23.8 mmol/L (ref 20.0–28.0)
O2 Saturation: 29.7 %
Patient temperature: 98.6
pCO2, Ven: 48.9 mmHg (ref 44.0–60.0)
pH, Ven: 7.308 (ref 7.250–7.430)
pO2, Ven: <31 mmHg — CL (ref 32.0–45.0)

## 2020-05-26 LAB — HEMOGLOBIN AND HEMATOCRIT, BLOOD
HCT: 27.8 % — ABNORMAL LOW (ref 36.0–46.0)
HCT: 29.9 % — ABNORMAL LOW (ref 36.0–46.0)
Hemoglobin: 8.6 g/dL — ABNORMAL LOW (ref 12.0–15.0)
Hemoglobin: 9.5 g/dL — ABNORMAL LOW (ref 12.0–15.0)

## 2020-05-26 LAB — GLUCOSE, CAPILLARY
Glucose-Capillary: 106 mg/dL — ABNORMAL HIGH (ref 70–99)
Glucose-Capillary: 109 mg/dL — ABNORMAL HIGH (ref 70–99)
Glucose-Capillary: 131 mg/dL — ABNORMAL HIGH (ref 70–99)
Glucose-Capillary: 141 mg/dL — ABNORMAL HIGH (ref 70–99)
Glucose-Capillary: 162 mg/dL — ABNORMAL HIGH (ref 70–99)
Glucose-Capillary: 58 mg/dL — ABNORMAL LOW (ref 70–99)
Glucose-Capillary: 71 mg/dL (ref 70–99)

## 2020-05-26 LAB — PHOSPHORUS
Phosphorus: 1.5 mg/dL — ABNORMAL LOW (ref 2.5–4.6)
Phosphorus: 2.9 mg/dL (ref 2.5–4.6)

## 2020-05-26 LAB — BRAIN NATRIURETIC PEPTIDE: B Natriuretic Peptide: 828.1 pg/mL — ABNORMAL HIGH (ref 0.0–100.0)

## 2020-05-26 LAB — MAGNESIUM: Magnesium: 2 mg/dL (ref 1.7–2.4)

## 2020-05-26 LAB — CORTISOL: Cortisol, Plasma: 19.9 ug/dL

## 2020-05-26 MED ORDER — MIDAZOLAM HCL 2 MG/2ML IJ SOLN: 1.0000 mg | INTRAMUSCULAR | Status: DC | PRN

## 2020-05-26 MED ORDER — MIDAZOLAM BOLUS VIA INFUSION
1.0000 mg | INTRAVENOUS | Status: DC | PRN
Start: 2020-05-26 — End: 2020-05-26

## 2020-05-26 MED ORDER — DEXMEDETOMIDINE HCL IN NACL 200 MCG/50ML IV SOLN
0.4000 ug/kg/h | INTRAVENOUS | Status: AC
  Administered 2020-05-26: 22:00:00 1.2 ug/kg/h via INTRAVENOUS
  Administered 2020-05-26 – 2020-05-27 (×2): 0.4 ug/kg/h via INTRAVENOUS
  Administered 2020-05-27: 21:00:00 0.8 ug/kg/h via INTRAVENOUS
  Administered 2020-05-27: 17:00:00 0.9 ug/kg/h via INTRAVENOUS
  Administered 2020-05-27: 15:00:00 1 ug/kg/h via INTRAVENOUS
  Administered 2020-05-28: 0.6 ug/kg/h via INTRAVENOUS
  Administered 2020-05-28: 0.8 ug/kg/h via INTRAVENOUS
  Filled 2020-05-26 (×8): qty 50

## 2020-05-26 MED ORDER — DEXTROSE 50 % IV SOLN
INTRAVENOUS | Status: AC
  Administered 2020-05-26: 25 mL
  Filled 2020-05-26: qty 50

## 2020-05-26 MED ORDER — ORAL CARE MOUTH RINSE: 15.0000 mL | Freq: Two times a day (BID) | OROMUCOSAL | Status: DC

## 2020-05-26 MED ORDER — ETOMIDATE 2 MG/ML IV SOLN: 10.0000 mg | Freq: Once | INTRAVENOUS | Status: AC

## 2020-05-26 MED ORDER — FENTANYL CITRATE (PF) 100 MCG/2ML IJ SOLN: 50.0000 ug | Freq: Once | INTRAMUSCULAR | Status: AC

## 2020-05-26 MED ORDER — ETOMIDATE 2 MG/ML IV SOLN
INTRAVENOUS | Status: AC
  Administered 2020-05-26: 10 mg via INTRAVENOUS
  Filled 2020-05-26: qty 20

## 2020-05-26 MED ORDER — FENTANYL CITRATE (PF) 100 MCG/2ML IJ SOLN
INTRAMUSCULAR | Status: AC
  Administered 2020-05-26: 50 ug via INTRAVENOUS
  Filled 2020-05-26: qty 2

## 2020-05-26 MED ORDER — ROCURONIUM BROMIDE 50 MG/5ML IV SOLN
50.0000 mg | Freq: Once | INTRAVENOUS | Status: DC
  Filled 2020-05-26: qty 5

## 2020-05-26 MED ORDER — MIDAZOLAM HCL 2 MG/2ML IJ SOLN
1.0000 mg | INTRAMUSCULAR | Status: DC | PRN
  Administered 2020-05-26: 2 mg via INTRAVENOUS
  Filled 2020-05-26 (×2): qty 2

## 2020-05-26 MED ORDER — THIAMINE HCL 100 MG/ML IJ SOLN
Freq: Once | INTRAVENOUS | Status: AC
  Filled 2020-05-26: qty 1000

## 2020-05-26 MED ORDER — MIDAZOLAM HCL 2 MG/2ML IJ SOLN
INTRAMUSCULAR | Status: AC
  Administered 2020-05-26: 1 mg via INTRAVENOUS
  Filled 2020-05-26: qty 4

## 2020-05-26 MED ORDER — FENTANYL CITRATE (PF) 100 MCG/2ML IJ SOLN
25.0000 ug | INTRAMUSCULAR | Status: DC | PRN
  Administered 2020-05-26: 50 ug via INTRAVENOUS
  Administered 2020-05-26: 25 ug via INTRAVENOUS
  Administered 2020-05-26 – 2020-05-28 (×7): 50 ug via INTRAVENOUS
  Filled 2020-05-26 (×9): qty 2

## 2020-05-26 MED ORDER — FUROSEMIDE 10 MG/ML IJ SOLN
40.0000 mg | Freq: Once | INTRAMUSCULAR | Status: AC
  Administered 2020-05-26: 40 mg via INTRAVENOUS
  Filled 2020-05-26: qty 4

## 2020-05-26 MED ORDER — ORAL CARE MOUTH RINSE
15.0000 mL | OROMUCOSAL | Status: DC
  Administered 2020-05-26 (×2): 15 mL via OROMUCOSAL

## 2020-05-26 MED ORDER — SODIUM CHLORIDE 0.9 % IV SOLN
2.0000 g | Freq: Two times a day (BID) | INTRAVENOUS | Status: DC
  Administered 2020-05-26 – 2020-05-29 (×6): 2 g via INTRAVENOUS
  Filled 2020-05-26 (×6): qty 2

## 2020-05-26 MED ORDER — OLANZAPINE 5 MG PO TBDP
5.0000 mg | ORAL_TABLET | Freq: Every day | ORAL | Status: DC
  Filled 2020-05-26 (×3): qty 1

## 2020-05-26 MED ORDER — POTASSIUM PHOSPHATES 15 MMOLE/5ML IV SOLN
45.0000 mmol | Freq: Once | INTRAVENOUS | Status: AC
  Administered 2020-05-26: 45 mmol via INTRAVENOUS
  Filled 2020-05-26: qty 15

## 2020-05-26 MED ORDER — ROCURONIUM BROMIDE 10 MG/ML (PF) SYRINGE
PREFILLED_SYRINGE | INTRAVENOUS | Status: AC
  Administered 2020-05-26: 50 mg via INTRAVENOUS
  Filled 2020-05-26: qty 10

## 2020-05-26 MED ORDER — PANTOPRAZOLE SODIUM 40 MG IV SOLR
40.0000 mg | Freq: Every day | INTRAVENOUS | Status: DC
  Administered 2020-05-26 – 2020-06-01 (×7): 40 mg via INTRAVENOUS
  Filled 2020-05-26 (×7): qty 40

## 2020-05-26 MED ORDER — MIDAZOLAM HCL 2 MG/2ML IJ SOLN: 1.0000 mg | Freq: Once | INTRAMUSCULAR | Status: AC

## 2020-05-26 MED ORDER — CHLORHEXIDINE GLUCONATE 0.12 % MT SOLN
15.0000 mL | Freq: Two times a day (BID) | OROMUCOSAL | Status: DC
  Administered 2020-05-26: 15 mL via OROMUCOSAL
  Filled 2020-05-26: qty 15

## 2020-05-26 MED ORDER — VANCOMYCIN HCL 500 MG/100ML IV SOLN
500.0000 mg | INTRAVENOUS | Status: DC
  Administered 2020-05-27 – 2020-05-28 (×2): 500 mg via INTRAVENOUS
  Filled 2020-05-26 (×2): qty 100

## 2020-05-26 MED ORDER — CHLORHEXIDINE GLUCONATE 0.12% ORAL RINSE (MEDLINE KIT): 15.0000 mL | Freq: Two times a day (BID) | OROMUCOSAL | Status: DC

## 2020-05-26 MED ORDER — VANCOMYCIN HCL IN DEXTROSE 1-5 GM/200ML-% IV SOLN
1000.0000 mg | Freq: Once | INTRAVENOUS | Status: AC
  Administered 2020-05-26: 1000 mg via INTRAVENOUS
  Filled 2020-05-26: qty 200

## 2020-05-26 MED ORDER — LEVOTHYROXINE SODIUM 100 MCG/5ML IV SOLN
44.0000 ug | Freq: Every day | INTRAVENOUS | Status: DC
  Administered 2020-05-26 – 2020-06-02 (×8): 44 ug via INTRAVENOUS
  Filled 2020-05-26 (×8): qty 5

## 2020-05-26 MED ORDER — DEXTROSE IN LACTATED RINGERS 5 % IV SOLN: INTRAVENOUS | Status: DC

## 2020-05-26 NOTE — Progress Notes (Signed)
eLink Physician-Brief Progress Note Patient Name: LORRETTA KERCE DOB: [birth year] MRN: [id number]   Date of Service  05/26/2020  HPI/Events of Note  K+ 3.2, Phos  1.5.  eICU Interventions  K+ Phos 45 mmol ordered per adult electrolyte replacement protocol.        Thomasene Lot Kebra [place] 05/26/2020, 6:45 AM

## 2020-05-26 NOTE — Progress Notes (Signed)
Patient agitated after extubation but had stable ETCO2. Developed Afib with RVR and respiratory distress. JVD on exam. Suddenly less responsive. BVM improved her alertness. Reintubated for respiratory failure. 6.5 ETT and NGT in place. Surgery updated.  Steffanie Dunn, DO 05/26/20 5:28 PM [facility]

## 2020-05-26 NOTE — Progress Notes (Signed)
Pharmacy Antibiotic Note  Lisa Murray is a 86 y.o. female admitted on 06/08/2020 with Exploratory lab/ SB resection, now with pneumonia.  Pharmacy has been consulted for Vancomycin & Cefepime dosing.  Plan: Cefepime 2gm q12 Vancomycin 1gm x1, then 500mg  q24   Height: 5\' 2"  (157.5 cm) Weight: 50.5 kg (111 lb 5.3 oz) IBW/kg (Calculated) : 50.1  Temp (24hrs), Avg:98.5 F (36.9 C), Min:97.8 F (36.6 C), Max:99.3 F (37.4 C)  Recent Labs  Lab 05/27/2020 1721 06/07/2020 1721 06/14/2020 1903 05/24/20 0239 05/24/20 1118 05/25/20 0531 05/25/20 1133 05/26/20 0450  WBC 21.0*   < >  --  10.4 17.0* 16.2* 13.6* 8.9  CREATININE 0.90  --   --  0.76  --  0.84  --  0.60  LATICACIDVEN 3.0*  --  3.4*  --  1.5  --   --   --    < > = values in this interval not displayed.    Estimated Creatinine Clearance: 39.2 mL/min (by C-G formula based on SCr of 0.6 mg/dL).    Allergies  Allergen Reactions  . Iodine Hives  . Codeine Nausea And Vomiting  . Doxycycline Nausea And Vomiting  . Fosamax [Alendronate] Nausea And Vomiting  . Macrobid [Nitrofurantoin] Nausea And Vomiting  . Tramadol Nausea And Vomiting  . Erythromycin Rash  . Morphine And Related Anxiety  . Novocain [Procaine] Anxiety  . Penicillin G Benzathine Rash  . Sulfa Antibiotics Rash    Antimicrobials this admission: 12/8 Cefepime >>  12/8 Vancomycin >>   Dose adjustments this admission:  Microbiology results: 12/5 UCx: < 10K insign growth  12/8 Sputum: collected  12/5 MRSA PCR: neg  Thank you for allowing pharmacy to be a part of this patient's care.  14/8 PharmD 05/26/2020 5:50 PM

## 2020-05-26 NOTE — Evaluation (Signed)
SLP Cancellation Note  Patient Details Name: Lisa Murray MRN: [id number] DOB: 1931-04-21   Cancelled treatment:       Reason Eval/Treat Not Completed: Other (comment) (order for swallow evaluation received, pt remains intubated at this time, will see pt for evaluation tomorrow - if needed today, please contact SLP before 1500. Thanks.)   Chales Abrahams 05/26/2020, 1:46 PM   Rolena Infante, MS [facility] SLP Acute Rehab Services Office [phone] Pager [phone]

## 2020-05-26 NOTE — Progress Notes (Signed)
RT x 2 attempted to obtain ABG without success. Order change to VBG per Dr. Chestine Spore.

## 2020-05-26 NOTE — Progress Notes (Signed)
Assessed at bedside for extubation. Pt awake, alert, picking at linens / mittens. Seems to be hard of hearing on exam but is moving all extremities.  Pulling Vt of 300-750 on PSV 5/5.  Will proceed with extubation and monitor closely.  NPO post extubation.    Canary Brim, MSN, NP-C, AGACNP-BC  Pulmonary & Critical Care 05/26/2020, 1:41 PM   Please see [email or web address] for pager details.

## 2020-05-26 NOTE — Progress Notes (Signed)
eLink Physician-Brief Progress Note Patient Name: MERRISSA GIACOBBE DOB: Aug 18, 1930 MRN: [id number]   Date of Service  05/26/2020  HPI/Events of Note  Patient is sub-optimally sedated on the ventilator.  eICU Interventions  Low dose Precedex ordered for sedation and anxiolysis.        Thomasene Lot Caydee [place] 05/26/2020, 12:09 AM

## 2020-05-26 NOTE — Progress Notes (Signed)
At bedside to place PICC , cannulated right brachial vein with ease on first attempt however  was unable to get catheter to pass centrally.  If central access is needed would recommend MD place a central line. Praimary RN aware and will notify MD.

## 2020-05-26 NOTE — Procedures (Signed)
Intubation Procedure Note  KIYO HEAL  [id number]  1930-08-10  Date:05/26/20  Time:4:54 PM   Provider Performing:Zana Biancardi Audrie Lia    Procedure: Intubation (31500)  Indication(s) Respiratory Failure  Consent Unable to obtain consent due to emergent nature of procedure.   Anesthesia Etomidate, Versed and Rocuronium   Time Out Verified patient identification, verified procedure, site/side was marked, verified correct patient position, special equipment/implants available, medications/allergies/relevant history reviewed, required imaging and test results available.   Sterile Technique Usual hand hygeine, masks, and gloves were used   Procedure Description Patient positioned in bed supine.  Sedation given as noted above.  Patient was intubated with endotracheal tube using Glidescope.  View was Grade 1 full glottis .  Number of attempts was 1.  Colorimetric CO2 detector was consistent with tracheal placement.  Purulent secretions noted in the pharynx- cleared with suctioning.   Complications/Tolerance transient SVT Chest X-ray is ordered to verify placement.   EBL 0   Specimen(s) None  Glidescope used to place NGT- tube confirmed to be advanced posterior to the posterior commissures and tube confirmed coiled in stomach/ HH on CXR. Tube to be pulled back and reconfirmed.  Steffanie Dunn, DO 05/26/20 4:59 PM [facility]

## 2020-05-26 NOTE — Progress Notes (Signed)
Progress Note: General Surgery Service   Chief Complaint/Subjective: Intubated, sedated in ICU.  NG tube unable to be replaced by nursing staff, surgery team or IR due to large hiatal hernia - going without NG for now.  Still on low dose levophed  Objective: Vital signs in last 24 hours: Temp:  [98 F (36.7 C)-99.6 F (37.6 C)] 98.2 F (36.8 C) (12/08 0357) Pulse Rate:  [70-120] 79 (12/08 0700) Resp:  [15-19] 17 (12/08 0700) SpO2:  [96 %-100 %] 100 % (12/08 0700) Arterial Line BP: (89-148)/(47-72) 112/54 (12/08 0700) FiO2 (%):  [30 %] 30 % (12/08 0346) Last BM Date:  (PTA)  Intake/Output from previous day: 12/07 0701 - 12/08 0700 In: 1133 [I.V.:1133] Out: 775 [Urine:775] Intake/Output this shift: Total I/O In: 1071.1 [I.V.:1071.1] Out: -   Gen: Sedated, ventilated  Resp: Ventilated, bilateral breath sounds,   Card: tachycardic, regular rhythm with PVCs this AM  Abd: Soft, incision c/d/i w/ dressing, non-distended  Lab Results: CBC  Recent Labs    05/25/20 1133 05/26/20 0450  WBC 13.6* 8.9  HGB 7.4* 7.0*  HCT 23.2* 21.7*  PLT 135* 112*   BMET Recent Labs    05/25/20 0531 05/26/20 0450  NA 142 144  K 3.9 3.2*  CL 112* 116*  CO2 21* 19*  GLUCOSE 109* 75  BUN 25* 25*  CREATININE 0.84 0.60  CALCIUM 8.7* 8.6*   PT/INR Recent Labs    05/25/20 0841  LABPROT 18.4*  INR 1.6*   ABG Recent Labs    06/02/2020 2151 05/24/20 0558  PHART 7.273* 7.355  HCO3 21.6 21.2    Anti-infectives: Anti-infectives (From admission, onward)   Start     Dose/Rate Route Frequency Ordered Stop   06/18/2020 2100  cefoTEtan (CEFOTAN) 1 g in sodium chloride 0.9 % 100 mL IVPB        1 g 200 mL/hr over 30 Minutes Intravenous On call to O.R. 06/02/2020 2008 06/11/2020 2112      Medications: Scheduled Meds: . acetaminophen  1,000 mg Oral Q6H  . ALPRAZolam  0.25 mg Oral Daily   And  . ALPRAZolam  0.5 mg Oral QHS  . chlorhexidine gluconate (MEDLINE KIT)  15 mL Mouth Rinse BID   . Chlorhexidine Gluconate Cloth  6 each Topical Q0600  . docusate sodium  100 mg Oral BID  . insulin aspart  2-6 Units Subcutaneous Q4H  . levothyroxine  88 mcg Oral Q0600  . mouth rinse  15 mL Mouth Rinse 10 times per day  . mirtazapine  30 mg Oral QHS  . pantoprazole (PROTONIX) IV  40 mg Intravenous QHS  . rosuvastatin  10 mg Oral Daily   Continuous Infusions: . sodium chloride 75 mL/hr at 05/26/20 0716  . sodium chloride    . dexmedetomidine (PRECEDEX) IV infusion    . fentaNYL infusion INTRAVENOUS 100 mcg/hr (05/26/20 0716)  . midazolam 0.5 mg/hr (05/25/20 1810)  . norepinephrine (LEVOPHED) Adult infusion 1 mcg/min (05/25/20 1707)  . potassium PHOSPHATE IVPB (in mmol) 45 mmol (05/26/20 0728)   PRN Meds:.ALPRAZolam, bisacodyl, diphenhydrAMINE **OR** diphenhydrAMINE, ipratropium-albuterol, ondansetron **OR** ondansetron (ZOFRAN) IV, phenol, phenylephrine, simethicone  Assessment/Plan: Ms. Talamo is a 86 year old female who presented with abdominal pain found to have a closed loop small bowel obstruction secondary to an adhesive band s/p exploratory laparotomy with small bowel resection on 05/21/2020 with Dr. Marcello Moores.  Adhesive closed loop SBO s/p sx as above - Unable to replace NG tube in IR due to large hiatal hernia,  will go without for now - Pain control - Awaiting return of bowel function  Lactic acidosis 2/2 ischemic bowel - Cleared on follow up lactic acid level 12/6  Acute blood loss anemia combined with dilutional anemia - HGB 7.0 from 7.6 yesterday - No CTA yesterday due to suspected contrast allergy as cause of stridor, plus hemodynamics leveled out some and hgb stable on repeat CBC yesterday - Getting close to requiring transfusion, hemodynamics may benefit from going ahead, will defer to critical care on transfusing now vs. Rechecking hgb later - Type and screen  Non-severe moderate degree of malnutrition, present on admission - Anticipating starting enteral  nutrition within 7 days of surgery but may need TPN  Post-extubation stridor requiring reintubation - Appreciate critical care team's assistance  - Using smaller ET tube and 24 hrs of steroids - Working towards extubation  Hypothyroidism - home medications  Hyperglycemia due to acute illness - SSI  FEN: IVF ID: none VTE: SCDs, hold lovenox for acute blood loss anemia (will likely need lower weight based dose when restarted) Foley: Continue Dispo: Continued ICU care    LOS: 3 days   Felicie Morn, MD [facility]

## 2020-05-26 NOTE — Progress Notes (Signed)
eLink Physician-Brief Progress Note Patient Name: Lisa Murray DOB: 10/03/1930 MRN: [id number]   Date of Service  05/26/2020  HPI/Events of Note  Patient continues to be very agitated despite maximum infusion rate Precedex and sub-lingual Zyprexa Zydis.  eICU Interventions  PRN iv Versed ordered, bilateral soft wrist restraints ordered to prevent self extubation.        Lisa Murray 05/26/2020, 11:01 PM

## 2020-05-26 NOTE — Progress Notes (Addendum)
NAME:  Lisa Murray, MRN:  [id number], DOB:  [birth year], LOS: 3 ADMISSION DATE:  05/29/2020, CONSULTATION DATE:  05/29/2020 REFERRING MD:  Romie Levee, CHIEF COMPLAINT:  Stridor  Brief History   86 year old F with a history of hypothyroidism, osteoporosis, admitted 12/5 with acute abdominal pain, n/v in the setting of a closed loop small bowel obstruction. She underwent an ex-lap with small bowel resection, primary anastomosis & lysis of adhesions.  Course complicated by stridor post extubation requiring reintubation, hypotension on vasopressors, anemia and difficulty with placement of feeding tube in the setting of hiatal hernia.     Past Medical History  Hx of Fall, requiring hospitalization and rehab Hypothyroidism Osteoporosis HTN S/p ab hysterectomy, bladder repair Appendectomy   Significant Hospital Events   12/05 Admit with abd pain, s/p ex-lab with small bowel resection & lysis of adhesions  Consults:  PCCM  Procedures:  ETT 12/5 >> L Radial ALine 12/5 >> 12/8   Significant Diagnostic Tests:  CT abd/pelv 12/5 >> features of high-grade small bowel obstruction a closed loop in the right lower quadrant demonstrating dilatation, thickening and adjacent inflammation and mural hypoattenuation concerning for vascular compromise. Possibly attributable to a post surgical adhesion given history of hysterectomy and appendectomy. Delayed right renal nephrogram with moderate hydroureteronephrosis with the distal ureter deviated towards the right lower quadrant and possibly involved by the obstructive process in the right lower quadrant. Mild mural thickening of the cecum though this is possibly reactive. Large hiatal hernia with a degree of likely organoaxial twisting with the pylorus remaining below the level of the diaphragm. Distended appearance of the proximal stomach could reflect some partial obstruction. Unchanged compression deformities and vertebral body height loss involving the L1,  L2 and L4 levels. Aortic Atherosclerosis.  Micro Data:  COVID 12/5 >> negative  Influenza 12/5 >> negative  UC 12/5 >> less than 10k colonies, insignificant growth  Antimicrobials:    Interim history/subjective:  On levohped  Afebrile  Hypoglycemia into 50's per RN, treated with D50w On vent - PEEP 5, 30% fiO2, +cuff leak on exam but no patient effort on wean   Objective   Blood pressure (!) 87/52, pulse 93, temperature 97.8 F (36.6 C), temperature source Axillary, resp. rate 15, height 5\' 2"  (1.575 m), weight 50.5 kg, SpO2 92 %.    Vent Mode: PRVC FiO2 (%):  [30 %] 30 % Set Rate:  [15 bmp] 15 bmp Vt Set:  [400 mL] 400 mL PEEP:  [5 cmH20] 5 cmH20 Plateau Pressure:  [14 cmH20-15 cmH20] 14 cmH20   Intake/Output Summary (Last 24 hours) at 05/26/2020 14/01/2020 Last data filed at 05/26/2020 14/01/2020 Gross per 24 hour  Intake 2012.44 ml  Output 775 ml  Net 1237.44 ml   Filed Weights   05-29-20 1648 05/29/20 1707 05/24/20 0500  Weight: 50.8 kg 50.8 kg 50.5 kg    Examination: General: elderly adult female lying in bed on vent in NAD  HEENT: MM pink/moist, ETT, anicteric Neuro: startles with touch, moves spontaneously, reaches for abd dressing, slightly wiggles toes to command, appears overall deconditioned / weak CV: s1s2 RRR, no m/r/g PULM: non-labored on vent, no effort on 5/5 PSV, lungs bilaterally clear, + loud cuff leak on NP exam GI: soft, bsx4 hypoactive, midline waffle dressing clean/dry/intact Extremities: warm/dry, dependent edema in hands, none in LE's  Skin: no rashes or lesions  CXR 12/8 >> images personally reviewed, ETT in good position, ? Layering effusion on left vs component of hernia  in chest   Resolved Hospital Problem list   Lactic Acidosis secondary to Bowel Ischemia   Assessment & Plan:   SBO, s/p Ex-Lap and partial resection Large Hiatal Hernia Moderate Malnutrition  -per CCS -unable to place gastric tube (nursing, MD, IR), concern for  malnutrition. Anticipate she will need TPN in the short term.  -will ask for PICC placement to allow for TPN administration, CCS anticipates initiating enteral nutrition within 7 days of surgery if able   Post-Extubation Stridor  Acute Respiratory Failure in setting of Upper Airway Obstruction Failed extubation, reintubated for stridor, treated with steroids.  -has + cuff leak, goal for extubation once alert / mental status remains barrier to extubation  -SBT when alert -PRVC 8cc/kg as rest mode  -minimize all sedating medications  -follow intermittent CXR  -PAD protocol with precedex if needed, PRN fentanyl for pain   Hypotension the setting of Sedation for Mechanical Ventilation  -wean levophed for MAP>65  -assess cortisol, may need stress dose steroids but hopeful to avoid given abdominal surgery  -tele / ICU monitoring -discontinue Aline as non-functional   Hypokalemia  Hypophosphatemia  -monitor electrolytes  -replaced 12/8  Hypothyroidism -continue IV synthroid   Hyper / Hypogylcemia  -SSI, standard scale  -add D5LR at 73ml/hr with episode of hypoglycemia 12/8  Acute Anemia Suspect multifactorial in setting of operative blood loss and dilutional component  -follow CBC  -repeat H/H at 2000 -transfuse for Hgb <7% or active bleeding (no obvious source of bleeding)   -CTA held with concern for possible contrast allergy as source of stridor post-op -type and screen ordered per surgery   Best practice (evaluated daily)  Diet: NPO Pain/Anxiety/Delirium protocol (if indicated): yes VAP protocol (if indicated): yes DVT prophylaxis: lovenox  GI prophylaxis: protonix Glucose control: SSI Mobility: progressive Last date of multidisciplinary goals of care discussion: per primary team  Family and staff present: Summary of discussion : Follow up goals of care discussion due:  Code Status: Full  Disposition: ICU   Labs   CBC: Recent Labs  Lab 06/06/2020 1721  06/03/2020 2151 05/24/20 0239 05/24/20 1118 05/25/20 0531 05/25/20 1133 05/26/20 0450  WBC 21.0*   < > 10.4 17.0* 16.2* 13.6* 8.9  NEUTROABS 18.7*  --   --   --   --   --  7.7  HGB 14.9   < > 10.9* 10.9* 7.6* 7.4* 7.0*  HCT 46.4*   < > 34.2* 33.3* 23.4* 23.2* 21.7*  MCV 98.7   < > 100.9* 99.4 99.6 101.3* 100.5*  PLT 265   < > 185 163 133* 135* 112*   < > = values in this interval not displayed.    Basic Metabolic Panel: Recent Labs  Lab 05/24/2020 1721 06/17/2020 2151 05/24/20 0239 05/25/20 0531 05/26/20 0450  NA 141 143 140 142 144  K 3.7 3.6 3.6 3.9 3.2*  CL 103  --  108 112* 116*  CO2 23  --  22 21* 19*  GLUCOSE 187*  --  165* 109* 75  BUN 12  --  13 25* 25*  CREATININE 0.90  --  0.76 0.84 0.60  CALCIUM 10.3  --  8.7* 8.7* 8.6*  MG  --   --   --   --  2.0  PHOS  --   --   --   --  1.5*   GFR: Estimated Creatinine Clearance: 39.2 mL/min (by C-G formula based on SCr of 0.6 mg/dL). Recent Labs  Lab 06/09/2020 1721 05/24/2020  1903 05/24/20 0239 05/24/20 1118 05/25/20 0531 05/25/20 1133 05/26/20 0450  WBC 21.0*  --    < > 17.0* 16.2* 13.6* 8.9  LATICACIDVEN 3.0* 3.4*  --  1.5  --   --   --    < > = values in this interval not displayed.    Liver Function Tests: Recent Labs  Lab 2020-05-27 1721  AST 26  ALT 17  ALKPHOS 48  BILITOT 0.7  PROT 7.3  ALBUMIN 4.0   Recent Labs  Lab 05-27-2020 1721  LIPASE 21   No results for input(s): AMMONIA in the last 168 hours.  ABG    Component Value Date/Time   PHART 7.355 05/24/2020 0558   PCO2ART 38.5 05/24/2020 0558   PO2ART 431 (H) 05/24/2020 0558   HCO3 21.2 05/24/2020 0558   TCO2 23 2020-05-27 2151   ACIDBASEDEF 3.6 (H) 05/24/2020 0558   O2SAT 100.0 05/24/2020 0558     Coagulation Profile: Recent Labs  Lab 05/25/20 0841  INR 1.6*    Cardiac Enzymes: No results for input(s): CKTOTAL, CKMB, CKMBINDEX, TROPONINI in the last 168 hours.  HbA1C: No results found for: HGBA1C  CBG: Recent Labs  Lab  05/25/20 1957 05/25/20 2310 05/26/20 0354 05/26/20 0825 05/26/20 0840  GLUCAP 86 75 71 58* 109*     Critical Care Time: 35 minutes  Canary Brim, MSN, NP-C, AGACNP-BC [facility] 05/26/2020, 9:22 AM   Please see [email or web address] for pager details.

## 2020-05-26 NOTE — Progress Notes (Addendum)
eLink Physician-Brief Progress Note Patient Name: Lisa Murray DOB: 1930/12/08 MRN: [id number]   Date of Service  05/26/2020  HPI/Events of Note  Sub-optimal sedation on the ventilator. Patient also has urinary retention.  eICU Interventions  Precedex ceiling increased to 1.2 mcg, Zyprexa Zydis ordered HS, Foley catheter ordered.        Thomasene Lot Corita Allinson 05/26/2020, 8:53 PM

## 2020-05-26 NOTE — Procedures (Signed)
Extubation Procedure Note  Patient Details:   Name: Lisa Murray DOB: [birth year] MRN: [id number]   Airway Documentation:  Airway 6.5 mm (Active)  Secured at (cm) 23 cm 05/26/20 1136  Measured From Lips 05/26/20 1136  Secured Location Center 05/26/20 0800  Secured By [employer] 05/26/20 1136  Tube Holder Repositioned Yes 05/26/20 1136  Prone position No 05/26/20 0800  Cuff Pressure (cm H2O) 26 cm H2O 05/24/20 1937  Site Condition Dry 05/26/20 0800   Vent end date: 05/26/20 Vent end time: 1348   Evaluation  O2 sats: stable throughout Complications: No apparent complications Patient did tolerate procedure well. Bilateral Breath Sounds: Clear, Diminished   Yes   Extubated to 3 L. No stridor noted and cuff leak present prior to extubation.   Dannielle Karvonen 05/26/2020, 1:50 PM

## 2020-05-27 ENCOUNTER — Inpatient Hospital Stay (HOSPITAL_COMMUNITY): Payer: PPO

## 2020-05-27 ENCOUNTER — Inpatient Hospital Stay: Payer: Self-pay

## 2020-05-27 DIAGNOSIS — I4891 Unspecified atrial fibrillation: Secondary | ICD-10-CM

## 2020-05-27 DIAGNOSIS — J810 Acute pulmonary edema: Secondary | ICD-10-CM

## 2020-05-27 LAB — BASIC METABOLIC PANEL
Anion gap: 12 (ref 5–15)
BUN: 16 mg/dL (ref 8–23)
CO2: 26 mmol/L (ref 22–32)
Calcium: 8.9 mg/dL (ref 8.9–10.3)
Chloride: 101 mmol/L (ref 98–111)
Creatinine, Ser: 0.68 mg/dL (ref 0.44–1.00)
GFR, Estimated: >60 mL/min (ref >60)
Glucose, Bld: 161 mg/dL — ABNORMAL HIGH (ref 70–99)
Potassium: 3.2 mmol/L — ABNORMAL LOW (ref 3.5–5.1)
Sodium: 139 mmol/L (ref 135–145)

## 2020-05-27 LAB — GLUCOSE, CAPILLARY
Glucose-Capillary: 115 mg/dL — ABNORMAL HIGH (ref 70–99)
Glucose-Capillary: 106 mg/dL — ABNORMAL HIGH (ref 70–99)
Glucose-Capillary: 115 mg/dL — ABNORMAL HIGH (ref 70–99)
Glucose-Capillary: 91 mg/dL (ref 70–99)
Glucose-Capillary: 105 mg/dL — ABNORMAL HIGH (ref 70–99)

## 2020-05-27 LAB — COMPREHENSIVE METABOLIC PANEL
ALT: 29 U/L (ref 0–44)
AST: 53 U/L — ABNORMAL HIGH (ref 15–41)
Albumin: 2.9 g/dL — ABNORMAL LOW (ref 3.5–5.0)
Alkaline Phosphatase: 56 U/L (ref 38–126)
Anion gap: 11 (ref 5–15)
BUN: 17 mg/dL (ref 8–23)
CO2: 22 mmol/L (ref 22–32)
Calcium: 8.9 mg/dL (ref 8.9–10.3)
Chloride: 109 mmol/L (ref 98–111)
Creatinine, Ser: 0.72 mg/dL (ref 0.44–1.00)
GFR, Estimated: >60 mL/min (ref >60)
Glucose, Bld: 150 mg/dL — ABNORMAL HIGH (ref 70–99)
Potassium: 3 mmol/L — ABNORMAL LOW (ref 3.5–5.1)
Sodium: 142 mmol/L (ref 135–145)
Total Bilirubin: 2.2 mg/dL — ABNORMAL HIGH (ref 0.3–1.2)
Total Protein: 5.7 g/dL — ABNORMAL LOW (ref 6.5–8.1)

## 2020-05-27 LAB — CBC
HCT: 26.1 % — ABNORMAL LOW (ref 36.0–46.0)
Hemoglobin: 8.4 g/dL — ABNORMAL LOW (ref 12.0–15.0)
MCH: 31.2 pg (ref 26.0–34.0)
MCHC: 32.2 g/dL (ref 30.0–36.0)
MCV: 97 fL (ref 80.0–100.0)
Platelets: 152 10*3/uL (ref 150–400)
RBC: 2.69 MIL/uL — ABNORMAL LOW (ref 3.87–5.11)
RDW: 14.4 % (ref 11.5–15.5)
WBC: 15.9 10*3/uL — ABNORMAL HIGH (ref 4.0–10.5)
nRBC: 0.1 % (ref 0.0–0.2)

## 2020-05-27 LAB — PHOSPHORUS
Phosphorus: 1.8 mg/dL — ABNORMAL LOW (ref 2.5–4.6)
Phosphorus: 3.3 mg/dL (ref 2.5–4.6)

## 2020-05-27 LAB — MAGNESIUM: Magnesium: 2.3 mg/dL (ref 1.7–2.4)

## 2020-05-27 MED ORDER — TRAVASOL 10 % IV SOLN
INTRAVENOUS | Status: AC
  Filled 2020-05-27: qty 378

## 2020-05-27 MED ORDER — FUROSEMIDE 10 MG/ML IJ SOLN
40.0000 mg | Freq: Once | INTRAMUSCULAR | Status: AC
  Administered 2020-05-27: 40 mg via INTRAVENOUS
  Filled 2020-05-27: qty 4

## 2020-05-27 MED ORDER — AMIODARONE HCL IN DEXTROSE 360-4.14 MG/200ML-% IV SOLN: 60.0000 mg/h | INTRAVENOUS | Status: DC

## 2020-05-27 MED ORDER — ENOXAPARIN SODIUM 30 MG/0.3ML ~~LOC~~ SOLN
30.0000 mg | SUBCUTANEOUS | Status: DC
  Administered 2020-05-27 – 2020-05-30 (×4): 30 mg via SUBCUTANEOUS
  Filled 2020-05-27 (×4): qty 0.3

## 2020-05-27 MED ORDER — SODIUM CHLORIDE 0.9 % IV SOLN
250.0000 mL | INTRAVENOUS | Status: DC
  Administered 2020-05-28: 250 mL via INTRAVENOUS

## 2020-05-27 MED ORDER — POTASSIUM CHLORIDE 10 MEQ/100ML IV SOLN
10.0000 meq | INTRAVENOUS | Status: DC
  Administered 2020-05-27 (×4): 10 meq via INTRAVENOUS
  Filled 2020-05-27 (×4): qty 100

## 2020-05-27 MED ORDER — ACETAMINOPHEN 160 MG/5ML PO SOLN
1000.0000 mg | Freq: Four times a day (QID) | ORAL | Status: DC
  Administered 2020-05-28 (×2): 1000 mg
  Filled 2020-05-27 (×2): qty 40.6

## 2020-05-27 MED ORDER — SODIUM CHLORIDE 0.9% FLUSH: 10.0000 mL | INTRAVENOUS | Status: DC | PRN

## 2020-05-27 MED ORDER — SODIUM CHLORIDE 0.9% FLUSH
10.0000 mL | Freq: Two times a day (BID) | INTRAVENOUS | Status: DC
  Administered 2020-05-27 – 2020-05-29 (×3): 10 mL
  Administered 2020-05-30: 09:00:00 30 mL
  Administered 2020-05-30 – 2020-06-01 (×5): 10 mL
  Administered 2020-06-02: 23:00:00 30 mL
  Administered 2020-06-02: 10:00:00 10 mL

## 2020-05-27 MED ORDER — SODIUM CHLORIDE 0.9% FLUSH
10.0000 mL | INTRAVENOUS | Status: DC | PRN
  Administered 2020-05-28: 10 mL

## 2020-05-27 MED ORDER — AMIODARONE HCL IN DEXTROSE 360-4.14 MG/200ML-% IV SOLN: 30.0000 mg/h | INTRAVENOUS | Status: DC

## 2020-05-27 MED ORDER — TRAVASOL 10 % IV SOLN
INTRAVENOUS | Status: DC
  Filled 2020-05-27: qty 378

## 2020-05-27 MED ORDER — SODIUM CHLORIDE 0.9% FLUSH
10.0000 mL | Freq: Two times a day (BID) | INTRAVENOUS | Status: DC
  Administered 2020-05-27 – 2020-05-28 (×2): 10 mL

## 2020-05-27 MED ORDER — MAGNESIUM SULFATE 2 GM/50ML IV SOLN
2.0000 g | Freq: Once | INTRAVENOUS | Status: AC
  Administered 2020-05-27: 2 g via INTRAVENOUS
  Filled 2020-05-27: qty 50

## 2020-05-27 MED ORDER — POTASSIUM CHLORIDE 10 MEQ/50ML IV SOLN
10.0000 meq | INTRAVENOUS | Status: AC
  Administered 2020-05-27 – 2020-05-28 (×3): 10 meq via INTRAVENOUS
  Filled 2020-05-27 (×3): qty 50

## 2020-05-27 MED ORDER — POTASSIUM PHOSPHATES 15 MMOLE/5ML IV SOLN
30.0000 mmol | Freq: Once | INTRAVENOUS | Status: AC
  Administered 2020-05-27: 30 mmol via INTRAVENOUS
  Filled 2020-05-27: qty 10

## 2020-05-27 MED ORDER — AMIODARONE LOAD VIA INFUSION
150.0000 mg | Freq: Once | INTRAVENOUS | Status: DC
  Filled 2020-05-27: qty 83.34

## 2020-05-27 MED ORDER — PHENYLEPHRINE HCL-NACL 10-0.9 MG/250ML-% IV SOLN
25.0000 ug/min | INTRAVENOUS | Status: DC
  Administered 2020-05-27: 45 ug/min via INTRAVENOUS
  Administered 2020-05-27: 50 ug/min via INTRAVENOUS
  Administered 2020-05-27: 25 ug/min via INTRAVENOUS
  Administered 2020-05-28: 45 ug/min via INTRAVENOUS
  Administered 2020-05-28: 110 ug/min via INTRAVENOUS
  Administered 2020-05-28: 100 ug/min via INTRAVENOUS
  Administered 2020-05-28: 85 ug/min via INTRAVENOUS
  Administered 2020-05-28: 35 ug/min via INTRAVENOUS
  Administered 2020-05-28: 85 ug/min via INTRAVENOUS
  Administered 2020-05-28: 65 ug/min via INTRAVENOUS
  Administered 2020-05-28: 85 ug/min via INTRAVENOUS
  Administered 2020-05-28: 100 ug/min via INTRAVENOUS
  Administered 2020-05-29 (×2): 120 ug/min via INTRAVENOUS
  Administered 2020-05-29: 04:00:00 160 ug/min via INTRAVENOUS
  Administered 2020-05-29 (×2): 170 ug/min via INTRAVENOUS
  Filled 2020-05-27 (×20): qty 250

## 2020-05-27 MED ORDER — METOPROLOL TARTRATE 5 MG/5ML IV SOLN: 2.5000 mg | Freq: Four times a day (QID) | INTRAVENOUS | Status: DC

## 2020-05-27 MED ORDER — PHENYLEPHRINE 40 MCG/ML (10ML) SYRINGE FOR IV PUSH (FOR BLOOD PRESSURE SUPPORT)
PREFILLED_SYRINGE | INTRAVENOUS | Status: AC
  Administered 2020-05-27: 0.15 ug
  Filled 2020-05-27: qty 10

## 2020-05-27 MED ORDER — OLANZAPINE 5 MG PO TABS
5.0000 mg | ORAL_TABLET | Freq: Every day | ORAL | Status: DC
  Administered 2020-05-27: 5 mg
  Filled 2020-05-27: qty 1

## 2020-05-27 MED ORDER — ORAL CARE MOUTH RINSE
15.0000 mL | OROMUCOSAL | Status: DC
  Administered 2020-05-27 – 2020-05-30 (×24): 15 mL via OROMUCOSAL

## 2020-05-27 MED ORDER — CHLORHEXIDINE GLUCONATE 0.12% ORAL RINSE (MEDLINE KIT)
15.0000 mL | Freq: Two times a day (BID) | OROMUCOSAL | Status: DC
  Administered 2020-05-27 – 2020-05-30 (×7): 15 mL via OROMUCOSAL

## 2020-05-27 MED ORDER — FUROSEMIDE 10 MG/ML IJ SOLN
40.0000 mg | Freq: Four times a day (QID) | INTRAMUSCULAR | Status: AC
  Administered 2020-05-27 – 2020-05-28 (×2): 40 mg via INTRAVENOUS
  Filled 2020-05-27 (×2): qty 4

## 2020-05-27 NOTE — Progress Notes (Signed)
ANTICOAGULATION CONSULT NOTE  Pharmacy Consult for Lovenox Indication: VTE prophylaxis  Allergies  Allergen Reactions  . Iodine Hives  . Codeine Nausea And Vomiting  . Doxycycline Nausea And Vomiting  . Fosamax [Alendronate] Nausea And Vomiting  . Macrobid [Nitrofurantoin] Nausea And Vomiting  . Tramadol Nausea And Vomiting  . Erythromycin Rash  . Morphine And Related Anxiety  . Novocain [Procaine] Anxiety  . Penicillin G Benzathine Rash  . Sulfa Antibiotics Rash    Patient Measurements: Height: 5\' 2"  (157.5 cm) Weight: 50.5 kg (111 lb 5.3 oz) IBW/kg (Calculated) : 50.1  Vital Signs: Temp: 97.9 F (36.6 C) (12/09 0800) Temp Source: Axillary (12/09 0800) BP: 106/58 (12/09 0800) Pulse Rate: 143 (12/09 0806)  Labs: Recent Labs    05/25/20 0841 05/25/20 1133 05/26/20 0450 05/26/20 1644 05/26/20 1954 05/26/20 1957 05/27/20 0319  HGB  --  7.4* 7.0* 8.6* 9.5*  --  8.4*  HCT  --  23.2* 21.7* 27.8* 29.9*  --  26.1*  PLT  --  135* 112*  --   --   --  152  LABPROT 18.4*  --   --   --   --   --   --   INR 1.6*  --   --   --   --   --   --   CREATININE  --   --  0.60  --   --  0.75 0.72    Estimated Creatinine Clearance: 39.2 mL/min (by C-G formula based on SCr of 0.72 mg/dL).   Medical History: Past Medical History:  Diagnosis Date  . Dizziness 02/27/2020  . Hypothyroidism 02/27/2020  . Senile osteoporosis 02/27/2020   Assessment: [name] intubated and sedated s/p ex-lap with small bowel resection for closed loop small bowel obstruction. Chemical VTE prophylaxis suspended for acute blood loss anemia/dilutional anemia now felt to be more dilutional with nosigns of bleeding per surgery. Pharmacy consulted to resume Lovenox "weight-based dosing."  05/27/20 9:20 AM  - weight 49.9 kg - SCr 0.72  Plan:  Will resume Lovenox at 30 mg daily due to low weight and recent bleeding. Will sign off and follow remotely. Thank you for the consult.   14/09/21  D 05/27/2020,8:20 AM

## 2020-05-27 NOTE — Progress Notes (Signed)
[facility] ADULT ICU REPLACEMENT PROTOCOL   The patient does apply for the [facility] Adult ICU Electrolyte Replacment Protocol based on the criteria listed below:   1. Is GFR >/= 30 ml/min? Yes.    Patient's GFR today is >60 2. Is SCr </= 2? Yes.   Patient's SCr is 0.72 ml/kg/hr 3. Did SCr increase >/= 0.5 in 24 hours? No. 4. Abnormal electrolyte(s): k 3.0 5. Ordered repletion with: protocol 6. If a panic level lab has been reported, has the CCM MD in charge been notified? No..   Physician:    Markus Daft A 05/27/2020 5:06 AM

## 2020-05-27 NOTE — Progress Notes (Signed)
Peripherally Inserted Central Catheter Placement  The IV Nurse has discussed with the patient and/or persons authorized to consent for the patient, the purpose of this procedure and the potential benefits and risks involved with this procedure.  The benefits include less needle sticks, lab draws from the catheter, and the patient may be discharged home with the catheter. Risks include, but not limited to, infection, bleeding, blood clot (thrombus formation), and puncture of an artery; nerve damage and irregular heartbeat and possibility to perform a PICC exchange if needed/ordered by physician.  Alternatives to this procedure were also discussed.  Bard Power PICC patient education guide, fact sheet on infection prevention and patient information card has been provided to patient /or left at bedside.    PICC Placement Documentation  PICC Triple Lumen 05/27/20 Left Brachial 38 cm 0 cm (Active)  Indication for Insertion or Continuance of Line Administration of hyperosmolar/irritating solutions (i.e. TPN, Vancomycin, etc.) 05/27/20 1259  Exposed Catheter (cm) 0 cm 05/27/20 1259  Site Assessment Clean;Dry;Intact 05/27/20 1259  Lumen #1 Status Flushed;Saline locked;Blood return noted 05/27/20 1259  Lumen #2 Status Flushed;Saline locked;Blood return noted 05/27/20 1259  Lumen #3 Status Flushed;Saline locked;Blood return noted 05/27/20 1259  Dressing Type Transparent 05/27/20 1259  Dressing Status Clean;Dry;Intact 05/27/20 1259  Antimicrobial disc in place? Yes 05/27/20 1259  Dressing Intervention New dressing 05/27/20 1259  Dressing Change Due 06/03/20 05/27/20 1259       Ethelda Chick 05/27/2020, 1:00 PM

## 2020-05-27 NOTE — Progress Notes (Signed)
Small amount of bright red blood in NG tube noted. MD notified. Stated that it was likely trauma. Asked to hold suction off for a while and restart LIS. Suction stopped,will resume in 2 hours.

## 2020-05-27 NOTE — TOC Progression Note (Signed)
Transition of Care [facility]) - Progression Note    Patient Details  Name: GENOVEVA SINGLETON MRN: [id number] Date of Birth: [birth year]  [facility] CM/SW Contact  Golda Acre, RN Phone Number: 05/27/2020, 8:56 AM  Clinical Narrative:    Failed extubation yesterday with concerns for flash pulmonary edema and pneumonia from critical care team.  Some response to lasix yesterday.  Continued critical care.    Expected Discharge Plan: Home/Self Care Barriers to Discharge: No Barriers Identified  Expected Discharge Plan and Services Expected Discharge Plan: Home/Self Care   Discharge Planning Services: CM Consult   Living arrangements for the past 2 months: Single Family Home                                       Social Determinants of Health (SDOH) Interventions    Readmission Risk Interventions No flowsheet data found.

## 2020-05-27 NOTE — Progress Notes (Addendum)
PHARMACY - TOTAL PARENTERAL NUTRITION CONSULT NOTE   Indication: Prolonged ileus s/p bowel resection  Patient Measurements: Height: 5\' 2"  (157.5 cm) Weight: 49.9 kg (110 lb 0.2 oz) IBW/kg (Calculated) : 50.1 TPN AdjBW (KG): 50.5 Body mass index is 20.12 kg/m.  Assessment: [name] s/p small bowel resection. Unable to place NGT in IR due to large hiatal hernia. Pharmacy consulted to initiate TPN for prolonged malnutrition/ileus.   Glucose / Insulin: CBGs at goal on sSSI q 4 h Electrolytes: appears to be refeeding on D5/1/2NS and D5/LR Replacing lytes, change to NS until TPN start Renal: SCr stable LFTs / TGs: mild elevation improving Prealbumin / albumin:  Intake / Output; MIVF: UOP 1725 mL, NS @ KVO GI Imaging: 12/5 CT: closed loop SBO, large hiatal hernia Surgeries / Procedures: ex-lap, small bowel resection 12/5  Central access:  Midline 12/9 TPN start date: 12/9  Nutritional Goals (per RD recommendation on ): RD recs pending kCal: , Protein: , Fluid:  Goal TPN rate is  mL/hr (provides  g of protein and  kcals per day)  Current Nutrition:  NPO  Plan:  Now: KCl runs per CCM + kphos 30 mmol BMET ordered 2000 by CCM, add mg + phos  At 1800: Start TPN at 35 mL/hr at 1800 providing 840 ml with 748 kcal and 37.8 g protein Standard lytes Electrolytes in TPN: 68mEq/L of Na, 88mEq/L of K, 20mEq/L of Ca, 58mEq/L of Mg, and 55mmol/L of Phos. Cl:Ac ratio 1:1 Add standard MVI and trace elements to TPN Continue Sensitive q4h SSI and adjust as needed  Continue NS @ KVO Monitor TPN labs on Mon/Thurs, baseline in AM  [place], [employer] D 05/27/2020,9:36 AM  Addendum 05/27/20 10:30 AM  Increased Na to 55 meq/L as TPN compounder was not able to accommodate 50 meq/L Na due to NaAc too small amt and IV room does not add NaAc via injection, only compounder per IV room RPh.   14/09/21, PharmD, [facility] Pharm[facility] [phone]

## 2020-05-27 NOTE — Progress Notes (Signed)
Progress Note: General Surgery Service   Chief Complaint/Subjective: Failed extubation yesterday with concerns for flash pulmonary edema and pneumonia from critical care team.  Some response to lasix yesterday.  Continued critical care.  Objective: Vital signs in last 24 hours: Temp:  [97.2 F (36.2 C)-99.3 F (37.4 C)] 97.2 F (36.2 C) (12/09 0100) Pulse Rate:  [68-116] 96 (12/09 0600) Resp:  [9-28] 26 (12/09 0600) BP: (66-183)/(43-125) 120/84 (12/09 0600) SpO2:  [53 %-100 %] 99 % (12/09 0600) Arterial Line BP: (107)/(98) 107/98 (12/08 0800) FiO2 (%):  [30 %-60 %] 30 % (12/09 0300) Last BM Date:  (PTA)  Intake/Output from previous day: 12/08 0701 - 12/09 0700 In: 2363.4 [I.V.:1758.7; IV Piggyback:604.7] Out: 3536 [Urine:1725; Emesis/NG output:100] Intake/Output this shift: Total I/O In: 583.9 [I.V.:398.7; IV Piggyback:185.2] Out: -   Gen: Sedated, ventilated  Resp: Ventilated, bilateral breath sounds,   Card: tachycardic, regular rhythm with PVCs this AM  Abd: Soft, incision c/d/i w/ dressing, non-distended  Lab Results: CBC  Recent Labs    05/26/20 0450 05/26/20 1644 05/26/20 1954 05/27/20 0319  WBC 8.9  --   --  15.9*  HGB 7.0*   < > 9.5* 8.4*  HCT 21.7*   < > 29.9* 26.1*  PLT 112*  --   --  152   < > = values in this interval not displayed.   BMET Recent Labs    05/26/20 1957 05/27/20 0319  NA 141 142  K 5.5* 3.0*  CL 108 109  CO2 21* 22  GLUCOSE 157* 150*  BUN 18 17  CREATININE 0.75 0.72  CALCIUM 9.0 8.9   PT/INR Recent Labs    05/25/20 0841  LABPROT 18.4*  INR 1.6*   ABG Recent Labs    05/26/20 1954  HCO3 23.8    Anti-infectives: Anti-infectives (From admission, onward)   Start     Dose/Rate Route Frequency Ordered Stop   05/27/20 2000  vancomycin (VANCOREADY) IVPB 500 mg/100 mL        500 mg 100 mL/hr over 60 Minutes Intravenous Every 24 hours 05/26/20 1800     05/26/20 2000  vancomycin (VANCOCIN) IVPB 1000 mg/200 mL premix         1,000 mg 200 mL/hr over 60 Minutes Intravenous  Once 05/26/20 1752 05/26/20 2245   05/26/20 1830  ceFEPIme (MAXIPIME) 2 g in sodium chloride 0.9 % 100 mL IVPB        2 g 200 mL/hr over 30 Minutes Intravenous Every 12 hours 05/26/20 1750     06/08/2020 2100  cefoTEtan (CEFOTAN) 1 g in sodium chloride 0.9 % 100 mL IVPB        1 g 200 mL/hr over 30 Minutes Intravenous On call to O.R. 05/26/2020 2008 06/09/2020 2112      Medications: Scheduled Meds: . acetaminophen  1,000 mg Oral Q6H  . chlorhexidine gluconate (MEDLINE KIT)  15 mL Mouth Rinse BID  . Chlorhexidine Gluconate Cloth  6 each Topical Q0600  . insulin aspart  2-6 Units Subcutaneous Q4H  . levothyroxine  44 mcg Intravenous Daily  . mouth rinse  15 mL Mouth Rinse 10 times per day  . mirtazapine  30 mg Oral QHS  . OLANZapine zydis  5 mg Oral QHS  . pantoprazole (PROTONIX) IV  40 mg Intravenous QHS  . rocuronium  50 mg Intravenous Once  . sodium chloride flush  10-40 mL Intracatheter Q12H   Continuous Infusions: . sodium chloride    . ceFEPime (MAXIPIME) IV Stopped (  05/27/20 8520)  . dexmedetomidine (PRECEDEX) IV infusion 0.4 mcg/kg/hr (05/27/20 0706)  . dextrose 5% lactated ringers 75 mL/hr at 05/27/20 0706  . norepinephrine (LEVOPHED) Adult infusion 2 mcg/min (05/27/20 0706)  . potassium chloride 10 mEq (05/27/20 0716)  . vancomycin     PRN Meds:.bisacodyl, [DISCONTINUED] diphenhydrAMINE **OR** diphenhydrAMINE, fentaNYL (SUBLIMAZE) injection, ipratropium-albuterol, midazolam, ondansetron **OR** ondansetron (ZOFRAN) IV, phenol, sodium chloride flush  Assessment/Plan: Lisa Murray is a 86 year old female who presented with abdominal pain found to have a closed loop small bowel obstruction secondary to an adhesive band s/p exploratory laparotomy with small bowel resection on 06/05/2020 with Dr. Marcello Moores.  Adhesive closed loop SBO s/p sx as above - Unable to replace NG tube in IR due to large hiatal hernia, will go without for  now - Pain control - Awaiting return of bowel function  Lactic acidosis 2/2 ischemic bowel - Cleared on follow up lactic acid level 12/6  Acute blood loss anemia combined with dilutional anemia - HGB improved today after lasix - probably more dilutional, no signs of bleeding - monitor hgb and transfuse only if needed  Non-severe moderate degree of malnutrition, present on admission - Anticipating starting enteral nutrition within 7 days of surgery but may need TPN  Post-extubation stridor requiring reintubation Possible flash pulmonary edema and or pneumonia - Appreciate critical care team's assistance  - Failed extubation 05/26/2020 - Some response to lasix last night - Antibiotics per critical care  Hypothyroidism - home medications  Hyperglycemia due to acute illness - SSI  FEN: IVF ID: [email or web address] per critical care VTE: SCDs, restart lovenox at lower dose today Foley: Continue Dispo: Continued ICU care    LOS: 4 days   Felicie Morn, MD [facility]

## 2020-05-27 NOTE — Progress Notes (Signed)
Nutrition Follow-up  DOCUMENTATION CODES:   Non-severe (moderate) malnutrition in context of chronic illness  INTERVENTION:  - TPN initiation and advancement per Pharmacist.   NUTRITION DIAGNOSIS:   Moderate Malnutrition related to chronic illness as evidenced by mild fat depletion,mild muscle depletion,moderate muscle depletion. -ongoing  GOAL:   Patient will meet greater than or equal to 90% of their needs -unable to meet at this time.  MONITOR:   Vent status,Labs,Weight trends,TF tolerance,Skin,Other (Comment) (TPN regimen)  REASON FOR ASSESSMENT:   Consult New TPN/TNA  ASSESSMENT:   87 y.o. female with medical history of osteoporosis and hypothyroidism. She had acute abdominal pain which started around 0400 on 12/5 and she had one episode of emesis. Pain was constant and worsening so she presented to the ED later in the day on 12/5. Patient reported recent constipation. She has surgical hx of hysterectomy, appendectomy, and bladder repair. Patient lives independently.  Patient was discussed in rounds this AM.  Patient was extubated yesterday at ~1350. She was re-intubated yesterday at ~1655 and NGT placed in R nare by CCM with glidescope at that time. but was removed overnight due to unknown reason. Attempt to place PICC yesterday was unsuccessful and mid-line placed. Re-attempt at PICC underway at this time.   Plan to start TPN today and order currently in place for custom TPN @ 40 ml/hr to start at 1800.    Weight has been stable throughout admission; admission date of 12/5. Mild pitting edema to RUE documented in the flow sheet.    Patient is currently intubated on ventilator support MV: 6.2 L/min Temp (24hrs), Avg:97.9 F (36.6 C), Min:97.2 F (36.2 C), Max:98.7 F (37.1 C) Propofol: none   Labs reviewed; CBGs: 115 and 106 mg/dl, K: 3 mmol/l, Phos: 1.8 mg/dl, AST elevated but down from 12/8 Medications reviewed; 40 mg IV lasix x1 dose 12/8 and x1 dose 12/9,  scale novolog, 44 mcg IV synthroid/day, 40 mg IV protonix/day, 30 mmol IV KPhos x1 run 12/9, 45 mmol IV KPhos x1 run 12/8. Drips; precedex @ 0.5 mcg/kg/hr, levo @ 2 mcg/min, neo @ 65 mcg/min.   Diet Order:   Diet Order            Diet NPO time specified  Diet effective now                 EDUCATION NEEDS:   Not appropriate for education at this time  Skin:  Skin Assessment: Skin Integrity Issues: Skin Integrity Issues:: Stage I,Incisions Stage I: mid lower back (new documentation 12/8) Incisions: abdomen (12/5)  Last BM:  PTA/unknown  Height:   Ht Readings from Last 1 Encounters:  05/25/20 5\' 2"  (1.575 m)    Weight:   Wt Readings from Last 1 Encounters:  05/27/20 49.9 kg     Estimated Nutritional Needs:  Kcal:  1034 kcal Protein:  95-105 grams Fluid:  >/= 1.7 L/day     14/09/21, MS, RD, LDN, CNSC Inpatient Clinical Dietitian RD pager # available in AMION  After hours/weekend pager # available in [facility]

## 2020-05-27 NOTE — Progress Notes (Addendum)
NAME:  Lisa Murray, MRN:  [id number], DOB:  1930-11-03, LOS: 4 ADMISSION DATE:  05/25/2020, CONSULTATION DATE:  06/02/2020 REFERRING MD:  Romie Levee, CHIEF COMPLAINT:  Stridor  Brief History   86 year old F with a history of hypothyroidism, osteoporosis, admitted 12/5 with acute abdominal pain, n/v in the setting of a closed loop small bowel obstruction. She underwent an ex-lap with small bowel resection, primary anastomosis & lysis of adhesions.  Course complicated by stridor post extubation requiring reintubation, hypotension on vasopressors, anemia and difficulty with placement of feeding tube in the setting of hiatal hernia.     Past Medical History  Hx of Fall, requiring hospitalization and rehab Hypothyroidism Osteoporosis HTN S/p ab hysterectomy, bladder repair Appendectomy  Lives independently at baseline, independent of all ADL's prior to current admit (12/5)  Significant Hospital Events   12/05 Admit with abd pain, s/p ex-lab with small bowel resection & lysis of adhesions 12/08 Extubated, failed / hypoxic, reintubated with observed thick secretions at cords  Consults:  PCCM  Procedures:  ETT 12/5 >>12/8, 12/8 >>  L Radial ALine 12/5 >> 12/8   Significant Diagnostic Tests:  CT abd/pelv 12/5 >> features of high-grade small bowel obstruction a closed loop in the right lower quadrant demonstrating dilatation, thickening and adjacent inflammation and mural hypoattenuation concerning for vascular compromise. Possibly attributable to a post surgical adhesion given history of hysterectomy and appendectomy. Delayed right renal nephrogram with moderate hydroureteronephrosis with the distal ureter deviated towards the right lower quadrant and possibly involved by the obstructive process in the right lower quadrant. Mild mural thickening of the cecum though this is possibly reactive. Large hiatal hernia with a degree of likely organoaxial twisting with the pylorus remaining below the  level of the diaphragm. Distended appearance of the proximal stomach could reflect some partial obstruction. Unchanged compression deformities and vertebral body height loss involving the L1, L2 and L4 levels. Aortic Atherosclerosis.  Micro Data:  COVID 12/5 >> negative  Influenza 12/5 >> negative  UC 12/5 >> less than 10k colonies, insignificant growth  Antimicrobials:  Vanco 12/8 >>  Cefepime 12/8 >>   Interim history/subjective:  Pt remains intubated.  RN reports increased HR overnight / AFwRVR 120-150's, & that NGT was removed overnight.  On levophed  Afebrile  On vent - 30%, PEEP 5  Glucose range 106-150 I/O 1.7L UOP, +538 ml in last 24 hours   Objective   Blood pressure (!) 86/43, pulse (!) 137, temperature 97.9 F (36.6 C), temperature source Axillary, resp. rate (!) 24, height 5\' 2"  (1.575 m), weight 49.9 kg, SpO2 100 %.    Vent Mode: PRVC FiO2 (%):  [30 %-60 %] 30 % Set Rate:  [15 bmp-16 bmp] 16 bmp Vt Set:  [400 mL] 400 mL PEEP:  [5 cmH20] 5 cmH20 Plateau Pressure:  [6 cmH20-17 cmH20] 6 cmH20   Intake/Output Summary (Last 24 hours) at 05/27/2020 0944 Last data filed at 05/27/2020 0900 Gross per 24 hour  Intake 1870.05 ml  Output 1800 ml  Net 70.05 ml   Filed Weights   06/06/2020 1707 05/24/20 0500 05/27/20 0838  Weight: 50.8 kg 50.5 kg 49.9 kg    Examination: General: critically ill appearing elderly adult female lying in bed in NAD HEENT: MM pink/moist, ETT, pupils reactive, anicteric  Neuro: sedate, opens eyes to voice, tracks provider, attempts to mouth words  CV: s1s2 RRR, no m/r/g PULM: non-labored on vent, lungs bilaterally clear anterior, diminished bibasilar GI: soft, bsx4 hypoactive,  midline abd waffle abdominal dressing intact Extremities: warm/dry, dependent edema in hands, none in LE's   Skin: no rashes or lesions  Resolved Hospital Problem list   Lactic Acidosis secondary to Bowel Ischemia   Assessment & Plan:   SBO, s/p Ex-Lap and  partial resection Large Hiatal Hernia Moderate Malnutrition  -per CCS -NGT removed per nursing overnight  -will consider repeat attempt 12/9 -PICC placement per IV team for TPN -per CCS, anticipated enteral feeding in 7 days if able    Post-Extubation Stridor  Acute Respiratory Failure in setting of Upper Airway Obstruction Mild Pulmonary Edema  Rule Out HCAP Failed extubation, reintubated for stridor post-op which may have been related to contrast pre-op for imaging, treated with steroids.  Failed again 12/9 with hypoxia, poor effort.  -PRVC 8cc/kg  -wean PEEP / fiO2 for sats >90% -daily SBT attempts  -minimize all sedating medications, suspect residual benzo effect still an issue -follow intermittent CXR  -abx as above given visualization of secretions in airway upon intubation 12/8 -PAD protocol, avoid benzo's, for RASS goal 0 to -1  -lasix 40 mg IV x1 for pulmonary edema   Hypotension  Suspect multifactorial in the setting of sedation for mechanical ventilation, AFwithRVR. Cortisol 19.   -change to neo-synephrine, stop levophed  -monitor hemodynamics   AFwRVR -tele monitoring  -not a candidate for anticoagulation currently given recent surgery  -can not have amiodarone with iodine allergy  -low dose beta blocker as BP allows  -continue neo for MAP >65   Hypokalemia  Hypophosphatemia  -monitor, replace as indicated > KPhos 12/9, Mg+ given AF  Hypothyroidism -continue IV synthroid given NPO status   Hyper / Hypogylcemia  -SSI, standard scale  -at risk hypoglycemia given NPO status  Acute Anemia Suspect multifactorial in setting of operative blood loss and dilutional component  -trend CBC  -transfuse for Hgb <7% or active bleeding   Best practice (evaluated daily)  Diet: NPO Pain/Anxiety/Delirium protocol (if indicated): yes VAP protocol (if indicated): yes DVT prophylaxis: lovenox  GI prophylaxis: protonix Glucose control: SSI Mobility: progressive Last  date of multidisciplinary goals of care discussion: per primary team.  Updated Dan Maker 12/9 via phone on plan of care, current status.  Reviewed concerns of advanced age, surgery and failed extubation attempts.  He has an excellent understanding of her circumstances and is hopeful that she will be able to return home after this hospitalization but realizes it may mean she will not be able to live independently in the short term.  Family and staff present: Summary of discussion: Follow up goals of care discussion due:  Code Status: Full Code  Disposition: ICU   Labs   CBC: Recent Labs  Lab 05/29/2020 1721 05/31/2020 2151 05/24/20 1118 05/25/20 0531 05/25/20 1133 05/26/20 0450 05/26/20 1644 05/26/20 1954 05/27/20 0319  WBC 21.0*   < > 17.0* 16.2* 13.6* 8.9  --   --  15.9*  NEUTROABS 18.7*  --   --   --   --  7.7  --   --   --   HGB 14.9   < > 10.9* 7.6* 7.4* 7.0* 8.6* 9.5* 8.4*  HCT 46.4*   < > 33.3* 23.4* 23.2* 21.7* 27.8* 29.9* 26.1*  MCV 98.7   < > 99.4 99.6 101.3* 100.5*  --   --  97.0  PLT 265   < > 163 133* 135* 112*  --   --  152   < > = values in this interval not displayed.  Basic Metabolic Panel: Recent Labs  Lab 05/24/20 0239 05/25/20 0531 05/26/20 0450 05/26/20 1954 05/26/20 1957 05/27/20 0319  NA 140 142 144  --  141 142  K 3.6 3.9 3.2*  --  5.5* 3.0*  CL 108 112* 116*  --  108 109  CO2 22 21* 19*  --  21* 22  GLUCOSE 165* 109* 75  --  157* 150*  BUN 13 25* 25*  --  18 17  CREATININE 0.76 0.84 0.60  --  0.75 0.72  CALCIUM 8.7* 8.7* 8.6*  --  9.0 8.9  MG  --   --  2.0  --   --   --   PHOS  --   --  1.5* 2.9  --  1.8*   GFR: Estimated Creatinine Clearance: 39 mL/min (by C-G formula based on SCr of 0.72 mg/dL). Recent Labs  Lab 05/21/2020 1721 06/14/2020 1903 05/24/20 0239 05/24/20 1118 05/25/20 0531 05/25/20 1133 05/26/20 0450 05/27/20 0319  WBC 21.0*  --    < > 17.0* 16.2* 13.6* 8.9 15.9*  LATICACIDVEN 3.0* 3.4*  --  1.5  --   --   --   --    < >  = values in this interval not displayed.    Liver Function Tests: Recent Labs  Lab 05/31/2020 1721 05/26/20 1957 05/27/20 0319  AST 26 81* 53*  ALT 17 32 29  ALKPHOS 48 64 56  BILITOT 0.7 2.9* 2.2*  PROT 7.3 6.4* 5.7*  ALBUMIN 4.0 3.4* 2.9*   Recent Labs  Lab 05/26/2020 1721  LIPASE 21   No results for input(s): AMMONIA in the last 168 hours.  ABG    Component Value Date/Time   PHART 7.355 05/24/2020 0558   PCO2ART 38.5 05/24/2020 0558   PO2ART 431 (H) 05/24/2020 0558   HCO3 23.8 05/26/2020 1954   TCO2 23 05/19/2020 2151   ACIDBASEDEF 2.0 05/26/2020 1954   O2SAT 29.7 05/26/2020 1954     Coagulation Profile: Recent Labs  Lab 05/25/20 0841  INR 1.6*    Cardiac Enzymes: No results for input(s): CKTOTAL, CKMB, CKMBINDEX, TROPONINI in the last 168 hours.  HbA1C: No results found for: HGBA1C  CBG: Recent Labs  Lab 05/26/20 1558 05/26/20 2038 05/26/20 2353 05/27/20 0350 05/27/20 0759  GLUCAP 106* 162* 141* 115* 106*     Critical Care Time: 35 minutes  Canary Brim, MSN, NP-C, AGACNP-BC [facility] 05/27/2020, 9:44 AM   Please see [email or web address] for pager details.

## 2020-05-27 NOTE — Progress Notes (Signed)
eLink Physician-Brief Progress Note Patient Name: Lisa Murray DOB: Nov 14, 1930 MRN: [id number]   Date of Service  05/27/2020  HPI/Events of Note  Bedside RN asking if NG tube is to resume LIS order, it was ordered held for two hours earlier today .  eICU Interventions  NG tube to LIS ordered resumed.        Thomasene Lot Lorilei Horan 05/27/2020, 10:03 PM

## 2020-05-27 NOTE — Progress Notes (Signed)
Brief Pharmacy Note re: electrolytes  8pm labs: K 3.2 - CCM has already ordered IV KCL x3 runs  Mag 2.3, Phos 3.3- WNL  Assessment/Plan- No additional repletion tonight.  F/U morning labs.  Junita Push, PharmD, BCPS 05/27/2020@10 :33 PM

## 2020-05-28 ENCOUNTER — Inpatient Hospital Stay (HOSPITAL_COMMUNITY): Payer: PPO

## 2020-05-28 DIAGNOSIS — J9601 Acute respiratory failure with hypoxia: Secondary | ICD-10-CM

## 2020-05-28 DIAGNOSIS — R9431 Abnormal electrocardiogram [ECG] [EKG]: Secondary | ICD-10-CM

## 2020-05-28 DIAGNOSIS — E440 Moderate protein-calorie malnutrition: Secondary | ICD-10-CM

## 2020-05-28 DIAGNOSIS — R451 Restlessness and agitation: Secondary | ICD-10-CM

## 2020-05-28 LAB — ECHOCARDIOGRAM COMPLETE
Area-P 1/2: 3.17 cm2
Height: 62 in
S' Lateral: 2.2 cm
Weight: 1760.15 oz

## 2020-05-28 LAB — DIFFERENTIAL
Abs Immature Granulocytes: 0.16 10*3/uL — ABNORMAL HIGH (ref 0.00–0.07)
Basophils Absolute: 0 10*3/uL (ref 0.0–0.1)
Basophils Relative: 0 %
Eosinophils Absolute: 0 10*3/uL (ref 0.0–0.5)
Eosinophils Relative: 0 %
Immature Granulocytes: 1 %
Lymphocytes Relative: 11 %
Lymphs Abs: 1.6 10*3/uL (ref 0.7–4.0)
Monocytes Absolute: 0.9 10*3/uL (ref 0.1–1.0)
Monocytes Relative: 6 %
Neutro Abs: 11.6 10*3/uL — ABNORMAL HIGH (ref 1.7–7.7)
Neutrophils Relative %: 82 %

## 2020-05-28 LAB — GLUCOSE, CAPILLARY
Glucose-Capillary: 103 mg/dL — ABNORMAL HIGH (ref 70–99)
Glucose-Capillary: 107 mg/dL — ABNORMAL HIGH (ref 70–99)
Glucose-Capillary: 116 mg/dL — ABNORMAL HIGH (ref 70–99)
Glucose-Capillary: 120 mg/dL — ABNORMAL HIGH (ref 70–99)
Glucose-Capillary: 132 mg/dL — ABNORMAL HIGH (ref 70–99)
Glucose-Capillary: 89 mg/dL (ref 70–99)
Glucose-Capillary: 98 mg/dL (ref 70–99)

## 2020-05-28 LAB — COMPREHENSIVE METABOLIC PANEL
ALT: 105 U/L — ABNORMAL HIGH (ref 0–44)
AST: 134 U/L — ABNORMAL HIGH (ref 15–41)
Albumin: 2.6 g/dL — ABNORMAL LOW (ref 3.5–5.0)
Alkaline Phosphatase: 93 U/L (ref 38–126)
Anion gap: 12 (ref 5–15)
BUN: 17 mg/dL (ref 8–23)
CO2: 25 mmol/L (ref 22–32)
Calcium: 8.3 mg/dL — ABNORMAL LOW (ref 8.9–10.3)
Chloride: 104 mmol/L (ref 98–111)
Creatinine, Ser: 0.66 mg/dL (ref 0.44–1.00)
GFR, Estimated: >60 mL/min (ref >60)
Glucose, Bld: 134 mg/dL — ABNORMAL HIGH (ref 70–99)
Potassium: 2.8 mmol/L — ABNORMAL LOW (ref 3.5–5.1)
Sodium: 141 mmol/L (ref 135–145)
Total Bilirubin: 1.8 mg/dL — ABNORMAL HIGH (ref 0.3–1.2)
Total Protein: 5.6 g/dL — ABNORMAL LOW (ref 6.5–8.1)

## 2020-05-28 LAB — BASIC METABOLIC PANEL
Anion gap: 9 (ref 5–15)
BUN: 19 mg/dL (ref 8–23)
CO2: 25 mmol/L (ref 22–32)
Calcium: 8.5 mg/dL — ABNORMAL LOW (ref 8.9–10.3)
Chloride: 106 mmol/L (ref 98–111)
Creatinine, Ser: 0.74 mg/dL (ref 0.44–1.00)
GFR, Estimated: >60 mL/min (ref >60)
Glucose, Bld: 107 mg/dL — ABNORMAL HIGH (ref 70–99)
Potassium: 4.1 mmol/L (ref 3.5–5.1)
Sodium: 140 mmol/L (ref 135–145)

## 2020-05-28 LAB — CBC
HCT: 23.7 % — ABNORMAL LOW (ref 36.0–46.0)
Hemoglobin: 7.9 g/dL — ABNORMAL LOW (ref 12.0–15.0)
MCH: 32.1 pg (ref 26.0–34.0)
MCHC: 33.3 g/dL (ref 30.0–36.0)
MCV: 96.3 fL (ref 80.0–100.0)
Platelets: 165 10*3/uL (ref 150–400)
RBC: 2.46 MIL/uL — ABNORMAL LOW (ref 3.87–5.11)
RDW: 14.1 % (ref 11.5–15.5)
WBC: 14.3 10*3/uL — ABNORMAL HIGH (ref 4.0–10.5)
nRBC: 0.2 % (ref 0.0–0.2)

## 2020-05-28 LAB — BRAIN NATRIURETIC PEPTIDE: B Natriuretic Peptide: 871.2 pg/mL — ABNORMAL HIGH (ref 0.0–100.0)

## 2020-05-28 LAB — TRIGLYCERIDES: Triglycerides: 94 mg/dL (ref <150)

## 2020-05-28 LAB — PREALBUMIN: Prealbumin: 5.5 mg/dL — ABNORMAL LOW (ref 18–38)

## 2020-05-28 LAB — MAGNESIUM
Magnesium: 1.9 mg/dL (ref 1.7–2.4)
Magnesium: 1.7 mg/dL (ref 1.7–2.4)

## 2020-05-28 LAB — PHOSPHORUS
Phosphorus: 2.8 mg/dL (ref 2.5–4.6)
Phosphorus: 2.3 mg/dL — ABNORMAL LOW (ref 2.5–4.6)

## 2020-05-28 LAB — TSH: TSH: 2.276 u[IU]/mL (ref 0.350–4.500)

## 2020-05-28 MED ORDER — FENTANYL 2500MCG IN NS 250ML (10MCG/ML) PREMIX INFUSION
25.0000 ug/h | INTRAVENOUS | Status: DC
  Administered 2020-05-28: 25 ug/h via INTRAVENOUS
  Administered 2020-05-28: 75 ug/h via INTRAVENOUS
  Filled 2020-05-28 (×2): qty 250

## 2020-05-28 MED ORDER — TRAVASOL 10 % IV SOLN
INTRAVENOUS | Status: AC
  Filled 2020-05-28: qty 462

## 2020-05-28 MED ORDER — TRAVASOL 10 % IV SOLN
INTRAVENOUS | Status: DC
  Filled 2020-05-28: qty 462

## 2020-05-28 MED ORDER — FUROSEMIDE 10 MG/ML IJ SOLN
60.0000 mg | Freq: Four times a day (QID) | INTRAMUSCULAR | Status: DC
  Administered 2020-05-28 (×3): 60 mg via INTRAVENOUS
  Filled 2020-05-28 (×4): qty 6

## 2020-05-28 MED ORDER — POTASSIUM CHLORIDE 10 MEQ/50ML IV SOLN
10.0000 meq | INTRAVENOUS | Status: AC
  Administered 2020-05-28 (×4): 10 meq via INTRAVENOUS
  Filled 2020-05-28 (×6): qty 50

## 2020-05-28 MED ORDER — POTASSIUM CHLORIDE 20 MEQ PO PACK
40.0000 meq | PACK | Freq: Once | ORAL | Status: AC
  Administered 2020-05-28: 40 meq
  Filled 2020-05-28: qty 2

## 2020-05-28 MED ORDER — PROPOFOL 1000 MG/100ML IV EMUL
0.0000 ug/kg/min | INTRAVENOUS | Status: DC
  Administered 2020-05-28: 5 ug/kg/min via INTRAVENOUS
  Administered 2020-05-29 (×2): 30 ug/kg/min via INTRAVENOUS
  Filled 2020-05-28: qty 100
  Filled 2020-05-28: qty 200
  Filled 2020-05-28: qty 100

## 2020-05-28 MED ORDER — POTASSIUM CHLORIDE 10 MEQ/50ML IV SOLN
10.0000 meq | INTRAVENOUS | Status: AC
  Administered 2020-05-28 (×4): 10 meq via INTRAVENOUS
  Filled 2020-05-28 (×4): qty 50

## 2020-05-28 MED ORDER — FENTANYL CITRATE (PF) 100 MCG/2ML IJ SOLN
100.0000 ug | Freq: Once | INTRAMUSCULAR | Status: AC
  Administered 2020-05-28: 100 ug via INTRAVENOUS
  Filled 2020-05-28: qty 2

## 2020-05-28 MED ORDER — FENTANYL BOLUS VIA INFUSION
25.0000 ug | INTRAVENOUS | Status: DC | PRN
Start: 2020-05-28 — End: 2020-05-30
  Administered 2020-05-28 – 2020-05-30 (×10): 25 ug via INTRAVENOUS
  Filled 2020-05-28: qty 25

## 2020-05-28 MED ORDER — PROPOFOL 1000 MG/100ML IV EMUL
INTRAVENOUS | Status: AC
  Filled 2020-05-28: qty 100

## 2020-05-28 NOTE — Progress Notes (Signed)
Progress Note: General Surgery Service   Chief Complaint/Subjective: Very awake on ventilator this morning.  Agitated while medical team is trying not to over sedate in preparation for extubation, trying to kick staff overnight when given a bath.  Seems to nod appropriately to questions.   Objective: Vital signs in last 24 hours: Temp:  [97.2 F (36.2 C)-100.2 F (37.9 C)] 97.4 F (36.3 C) (12/10 0400) Pulse Rate:  [45-148] 49 (12/10 0600) Resp:  [16-32] 18 (12/10 0600) BP: (84-140)/(43-82) 100/54 (12/10 0600) SpO2:  [93 %-100 %] 97 % (12/10 0600) FiO2 (%):  [30 %] 30 % (12/10 0400) Weight:  [49.9 kg] 49.9 kg (12/09 0838) Last BM Date:  (PTA)  Intake/Output from previous day: 12/09 0701 - 12/10 0700 In: 4057.6 [I.V.:2664.8; IV Piggyback:1392.8] Out: 2482 [Urine:5475; Emesis/NG output:130] Intake/Output this shift: No intake/output data recorded.  Gen: Aggitated, ventilated  Resp: Ventilated, bilateral breath sounds,   Card: tachycardic,   Abd: Soft, incision c/d/i w/ dressing, non-distended  Lab Results: CBC  Recent Labs    05/27/20 0319 05/28/20 0257  WBC 15.9* 14.3*  HGB 8.4* 7.9*  HCT 26.1* 23.7*  PLT 152 165   BMET Recent Labs    05/27/20 2047 05/28/20 0257  NA 139 141  K 3.2* 2.8*  CL 101 104  CO2 26 25  GLUCOSE 161* 134*  BUN 16 17  CREATININE 0.68 0.66  CALCIUM 8.9 8.3*   PT/INR Recent Labs    05/25/20 0841  LABPROT 18.4*  INR 1.6*   ABG Recent Labs    05/26/20 1954  HCO3 23.8    Anti-infectives: Anti-infectives (From admission, onward)   Start     Dose/Rate Route Frequency Ordered Stop   05/27/20 2000  vancomycin (VANCOREADY) IVPB 500 mg/100 mL        500 mg 100 mL/hr over 60 Minutes Intravenous Every 24 hours 05/26/20 1800     05/26/20 2000  vancomycin (VANCOCIN) IVPB 1000 mg/200 mL premix        1,000 mg 200 mL/hr over 60 Minutes Intravenous  Once 05/26/20 1752 05/26/20 2245   05/26/20 1830  ceFEPIme (MAXIPIME) 2 g in sodium  chloride 0.9 % 100 mL IVPB        2 g 200 mL/hr over 30 Minutes Intravenous Every 12 hours 05/26/20 1750     06/16/2020 2100  cefoTEtan (CEFOTAN) 1 g in sodium chloride 0.9 % 100 mL IVPB        1 g 200 mL/hr over 30 Minutes Intravenous On call to O.R. 06/11/2020 2008 06/15/2020 2112      Medications: Scheduled Meds: . acetaminophen (TYLENOL) oral liquid 160 mg/5 mL  1,000 mg Per Tube Q6H  . chlorhexidine gluconate (MEDLINE KIT)  15 mL Mouth Rinse BID  . Chlorhexidine Gluconate Cloth  6 each Topical Q0600  . enoxaparin (LOVENOX) injection  30 mg Subcutaneous Q24H  . insulin aspart  2-6 Units Subcutaneous Q4H  . levothyroxine  44 mcg Intravenous Daily  . mouth rinse  15 mL Mouth Rinse 10 times per day  . OLANZapine  5 mg Per Tube QHS  . pantoprazole (PROTONIX) IV  40 mg Intravenous QHS  . rocuronium  50 mg Intravenous Once  . sodium chloride flush  10-40 mL Intracatheter Q12H  . sodium chloride flush  10-40 mL Intracatheter Q12H   Continuous Infusions: . sodium chloride Stopped (05/27/20 1029)  . ceFEPime (MAXIPIME) IV Stopped (05/28/20 0636)  . dexmedetomidine (PRECEDEX) IV infusion 0.6 mcg/kg/hr (05/28/20 0636)  . phenylephrine (  NEO-SYNEPHRINE) Adult infusion 65 mcg/min (05/28/20 0636)  . potassium chloride 10 mEq (05/28/20 2800)  . TPN ADULT (ION) 35 mL/hr at 05/28/20 0636  . vancomycin Stopped (05/27/20 2153)   PRN Meds:.bisacodyl, [DISCONTINUED] diphenhydrAMINE **OR** diphenhydrAMINE, fentaNYL (SUBLIMAZE) injection, ipratropium-albuterol, ondansetron **OR** ondansetron (ZOFRAN) IV, phenol, sodium chloride flush, sodium chloride flush  Assessment/Plan: Ms. Hargreaves is a 86 year old female who presented with abdominal pain found to have a closed loop small bowel obstruction secondary to an adhesive band s/p exploratory laparotomy with small bowel resection on 05/19/2020 with Dr. Marcello Moores.  Adhesive closed loop SBO s/p sx as above - NG till LIWS till return of bowel function - Pain  control - Awaiting return of bowel function  Lactic acidosis 2/2 ischemic bowel - Cleared on follow up lactic acid level 12/6  Acute blood loss anemia combined with dilutional anemia - HGB stable - monitor hgb and transfuse only if needed  Non-severe moderate degree of malnutrition, present on admission - Continue TPN till tolerating nutritional requirements PO  Post-extubation stridor requiring reintubation Possible flash pulmonary edema and or pneumonia Atrial fibrillation with rapid ventricular response - Appreciate critical care team's assistance  - Failed extubation 05/26/2020 - Good response to lasix last 24 hours - Trying to avoid over-sedating prior to a second attempt at extubation - Antibiotics per critical care  Hypothyroidism - home medications  Hyperglycemia due to acute illness - SSI  FEN: IVF ID: [email or web address] per critical care VTE: SCDs, 30 lovenox Foley: Continue Dispo: Continued ICU care    LOS: 5 days   Felicie Morn, MD [facility]

## 2020-05-28 NOTE — Progress Notes (Addendum)
NAME:  ETHAN KASPERSKI, MRN:  [id number], DOB:  27-Jun-1930, LOS: 5 ADMISSION DATE:  06/14/2020, CONSULTATION DATE:  05/29/2020 REFERRING MD:  Romie Levee, CHIEF COMPLAINT:  Stridor  Brief History   86 year old F with a history of hypothyroidism, osteoporosis, admitted 12/5 with acute abdominal pain, n/v in the setting of a closed loop small bowel obstruction. She underwent an ex-lap with small bowel resection, primary anastomosis & lysis of adhesions.  Course complicated by stridor post extubation requiring reintubation, hypotension on vasopressors, anemia and difficulty with placement of feeding tube in the setting of hiatal hernia.     Past Medical History  Hx of Fall, requiring hospitalization and rehab Hypothyroidism Osteoporosis HTN S/p ab hysterectomy, bladder repair Appendectomy  Lives independently at baseline, independent of all ADL's prior to current admit (12/5)  Significant Hospital Events   12/05 Admit with abd pain, s/p ex-lab with small bowel resection & lysis of adhesions 12/08 Extubated, failed / hypoxic, reintubated with observed thick secretions at cords 12/09 Episodes of bradycardia on precedex + neo. Levophed changed to neo in setting of AFwRVR  Consults:  PCCM  Procedures:  ETT 12/5 >>12/8, 12/8 >>  L Radial ALine 12/5 >> 12/8   Significant Diagnostic Tests:  CT abd/pelv 12/5 >> features of high-grade small bowel obstruction a closed loop in the right lower quadrant demonstrating dilatation, thickening and adjacent inflammation and mural hypoattenuation concerning for vascular compromise. Possibly attributable to a post surgical adhesion given history of hysterectomy and appendectomy. Delayed right renal nephrogram with moderate hydroureteronephrosis with the distal ureter deviated towards the right lower quadrant and possibly involved by the obstructive process in the right lower quadrant. Mild mural thickening of the cecum though this is possibly reactive. Large  hiatal hernia with a degree of likely organoaxial twisting with the pylorus remaining below the level of the diaphragm. Distended appearance of the proximal stomach could reflect some partial obstruction. Unchanged compression deformities and vertebral body height loss involving the L1, L2 and L4 levels. Aortic Atherosclerosis. ECHO 12/10 >>   Micro Data:  COVID 12/5 >> negative  Influenza 12/5 >> negative  UC 12/5 >> less than 10k colonies, insignificant growth Tracheal aspirate 12/8 >> few GNR, abundant staph aureus >>   Antimicrobials:  Vanco 12/8 >>  Cefepime 12/8 >>   Interim history/subjective:  RN reports bradycardia overnight into the 30's at times, in SR Remains on neo, precedex K+ being replaced Tracheal aspirate with abundant staph, few GNR Minimal vent needs - 30% FiO2, PEEP 5  Glucose range 107-134  Tmax 100.2 in last 24 hours I/O 5.4L UOP, -1.5L in last 24 hours  Objective   Blood pressure 121/67, pulse (!) 54, temperature 98.9 F (37.2 C), temperature source Axillary, resp. rate 20, height 5\' 2"  (1.575 m), weight 49.9 kg, SpO2 96 %.    Vent Mode: PRVC FiO2 (%):  [30 %] 30 % Set Rate:  [16 bmp] 16 bmp Vt Set:  [400 mL] 400 mL PEEP:  [5 cmH20] 5 cmH20 Plateau Pressure:  [12 cmH20-15 cmH20] 14 cmH20   Intake/Output Summary (Last 24 hours) at 05/28/2020 0920 Last data filed at 05/28/2020 0900 Gross per 24 hour  Intake 3801.85 ml  Output 5505 ml  Net -1703.15 ml   Filed Weights   05/30/2020 1707 05/24/20 0500 05/27/20 0838  Weight: 50.8 kg 50.5 kg 49.9 kg    Examination: General: critically ill appearing elderly female lying in bed in NAD HEENT: MM pink/moist, ETT, anicteric, pupils reactive,  old scar at anterior base of neck Neuro: Awakens to voice, nods / follows commands CV: s1s2 RRR, no m/r/g PULM: non-labored on vent, lungs bilaterally clear anterior, diminished bases  GI: soft, bsx4 hypoactive, midline abd dressing intact / clean  Extremities:  warm/dry, no edema  Skin: no rashes or lesions  Resolved Hospital Problem list   Lactic Acidosis secondary to Bowel Ischemia   Assessment & Plan:   SBO, s/p Ex-Lap and partial resection Large Hiatal Hernia Moderate Malnutrition  -post operativ care per CCS  -keep NGT, positioned in hiatal hernia. Very difficult to place  -defer timing of enteral feeding to CCS -NPO  Post-Extubation Stridor  Acute Respiratory Failure in setting of Upper Airway Obstruction Mild Pulmonary Edema  Rule Out HCAP Failed extubation, reintubated for stridor post-op which may have been related to contrast pre-op for imaging, treated with steroids.  Failed again 12/9 with hypoxia, poor effort.  -PRVC 8cc/kg  -wean PEEP / fiO2 for sats >90% -daily SBT / WUA -follow intermittent CXR  -PAD protocol for RASS 0 to-1, avoid benzo's. Change precedex to fentanyl gtt  -repeat lasix  -continue vanco, cefepime as above.  No clear infiltrate but LLL obscured by hiatal hernia.  Staph in tracheal aspirate.  Hypotension  Bradycardia  Suspect multifactorial in the setting of sedation for mechanical ventilation, AFwithRVR. Cortisol 19.   -continue neosynephrine for now, consider change back to levophed if persistent bradycardia  -monitor hemodynamics, MAP goal >65  AFwRVR  SB/SR on 12/10 -tele monitoring  -not a candidate for anticoagulation given recent surgery  -lasix as above  -check TSH  Hypokalemia  Hypophosphatemia  Concern for refeeding syndrome -follow electrolytes closely > replacement 12/10  -goal K >4, Mg >2 with AF  Hypothyroidism -continue IV synthroid until able to take PO's   Hyper / Hypogylcemia  -SSI, standard scale   Acute Anemia Suspect multifactorial in setting of operative blood loss and dilutional component  -follow CBC  -transfuse for Hgb <7%  Moderate Protein Calorie Malnutrition At Risk Refeeding Syndrome -TPN per pharmacy   Best practice (evaluated daily)  Diet:  NPO Pain/Anxiety/Delirium protocol (if indicated): yes VAP protocol (if indicated): yes DVT prophylaxis: lovenox  GI prophylaxis: protonix Glucose control: SSI Mobility: progressive Last date of multidisciplinary goals of care discussion: per primary team.  Message left for Dan Maker 12/10 for return call.  Family and staff present: Summary of discussion: Follow up goals of care discussion due:  Code Status: Full Code  Disposition: ICU   Labs   CBC: Recent Labs  Lab 2020/05/24 1721 2020-05-24 2151 05/25/20 0531 05/25/20 1133 05/26/20 0450 05/26/20 1644 05/26/20 1954 05/27/20 0319 05/28/20 0257  WBC 21.0*   < > 16.2* 13.6* 8.9  --   --  15.9* 14.3*  NEUTROABS 18.7*  --   --   --  7.7  --   --   --  11.6*  HGB 14.9   < > 7.6* 7.4* 7.0* 8.6* 9.5* 8.4* 7.9*  HCT 46.4*   < > 23.4* 23.2* 21.7* 27.8* 29.9* 26.1* 23.7*  MCV 98.7   < > 99.6 101.3* 100.5*  --   --  97.0 96.3  PLT 265   < > 133* 135* 112*  --   --  152 165   < > = values in this interval not displayed.    Basic Metabolic Panel: Recent Labs  Lab 05/26/20 0450 05/26/20 1954 05/26/20 1957 05/27/20 0319 05/27/20 2047 05/28/20 0257  NA 144  --  141 142 139 141  K 3.2*  --  5.5* 3.0* 3.2* 2.8*  CL 116*  --  108 109 101 104  CO2 19*  --  21* 22 26 25   GLUCOSE 75  --  157* 150* 161* 134*  BUN 25*  --  18 17 16 17   CREATININE 0.60  --  0.75 0.72 0.68 0.66  CALCIUM 8.6*  --  9.0 8.9 8.9 8.3*  MG 2.0  --   --   --  2.3 1.9  PHOS 1.5* 2.9  --  1.8* 3.3 2.8   GFR: Estimated Creatinine Clearance: 39 mL/min (by C-G formula based on SCr of 0.66 mg/dL). Recent Labs  Lab June 14, 2020 1721 2020/06/14 1903 05/24/20 0239 05/24/20 1118 05/25/20 0531 05/25/20 1133 05/26/20 0450 05/27/20 0319 05/28/20 0257  WBC 21.0*  --    < > 17.0*   < > 13.6* 8.9 15.9* 14.3*  LATICACIDVEN 3.0* 3.4*  --  1.5  --   --   --   --   --    < > = values in this interval not displayed.    Liver Function Tests: Recent Labs  Lab  06-14-2020 1721 05/26/20 1957 05/27/20 0319 05/28/20 0257  AST 26 81* 53* 134*  ALT 17 32 29 105*  ALKPHOS 48 64 56 93  BILITOT 0.7 2.9* 2.2* 1.8*  PROT 7.3 6.4* 5.7* 5.6*  ALBUMIN 4.0 3.4* 2.9* 2.6*   Recent Labs  Lab June 14, 2020 1721  LIPASE 21   No results for input(s): AMMONIA in the last 168 hours.  ABG    Component Value Date/Time   PHART 7.355 05/24/2020 0558   PCO2ART 38.5 05/24/2020 0558   PO2ART 431 (H) 05/24/2020 0558   HCO3 23.8 05/26/2020 1954   TCO2 23 06-14-2020 2151   ACIDBASEDEF 2.0 05/26/2020 1954   O2SAT 29.7 05/26/2020 1954     Coagulation Profile: Recent Labs  Lab 05/25/20 0841  INR 1.6*    Cardiac Enzymes: No results for input(s): CKTOTAL, CKMB, CKMBINDEX, TROPONINI in the last 168 hours.  HbA1C: No results found for: HGBA1C  CBG: Recent Labs  Lab 05/27/20 1619 05/27/20 1947 05/28/20 0026 05/28/20 0333 05/28/20 0759  GLUCAP 91 105* 132* 107* 116*     Critical Care Time: 33 minutes  14/10/21, MSN, NP-C, AGACNP-BC [facility] 05/28/2020, 9:20 AM   Please see [email or web address] for pager details.

## 2020-05-28 NOTE — Progress Notes (Signed)
PT Cancellation Note  Patient Details Name: Lisa Murray MRN: [id number] DOB: [birth year]   Cancelled Treatment:     PT order received but eval deferred this date.  Pt with BP 84/51, on vent and in restraints 2* combativeness with nursing.  Will follow.    Ryver Zadrozny 05/28/2020, 2:29 PM

## 2020-05-28 NOTE — Progress Notes (Addendum)
PHARMACY - TOTAL PARENTERAL NUTRITION CONSULT NOTE   Indication: Prolonged ileus s/p bowel resection  Patient Measurements: Height: 5\' 2"  (157.5 cm) Weight: 49.9 kg (110 lb 0.2 oz) IBW/kg (Calculated) : 50.1 TPN AdjBW (KG): 50.5 Body mass index is 20.12 kg/m.  Assessment: [name] s/p small bowel resection. Unable to place NGT in IR due to large hiatal hernia. Pharmacy consulted to initiate TPN for prolonged malnutrition/ileus.   Glucose / Insulin: CBGs at goal on sSSI q 4 h Electrolytes: appears to be refeeding syndrome.  K down to 2.8 despite replacement, Mag WNL, Phos WNL, CorrCa WNL Renal: SCr stable LFTs / TGs: AST/ALT elevated and increased to 134/105, Tbili decreased.  TG 94 Prealbumin / albumin: 5.5/2.6 (12/10) Intake / Output; MIVF: Large UOP on lasix, 5.5L/24 hours.  NS @ KVO GI Imaging: 12/5 CT: closed loop SBO, large hiatal hernia Surgeries / Procedures:  12/5 ex-lap, small bowel resection 12/9 NG tube replaced (currently in hiatal hernia).  LIWS till return of bowel function.  Central access:  Triple lumen PICC 12/9 TPN start date: 12/9  Nutritional Goals (per RD recommendation on 12/9) Kcal:  1034 kcal; Protein:  95-105 grams; Fluid:  >/= 1.7 L/day Goal TPN rate is 75 mL/hr (provides 99 g of protein and 1040 kcals per day)  Current Nutrition:  NPO  Plan:  Now: KCl runs x8 per CCM.  (BMET ordered 1700 by CCM)  At 1800: Continue TPN at 35 mL/hr, do not advance rate due to severe hypokalemia Electrolytes in TPN: 3mEq/L of Na, 54mEq/L of K, 31mEq/L of Ca, 72mEq/L of Mg, and 63mmol/L of Phos. Cl:Ac ratio 1:1 - Increased Na to 55 meq/L as TPN compounder was not able to accommodate 50 meq/L Na due to NaAc too small amt and IV room does not add NaAc via injection, only compounder per IV room RPh.  Add standard MVI and trace elements to TPN Continue Sensitive q4h SSI and adjust as needed  Continue NS @ KVO Monitor TPN labs on Mon/Thurs.  Bmet, mag, phos with AM  labs.   12m PharmD, BCPS Clinical Pharmacist [facility] main pharmacy [phone] 05/28/2020 9:11 AM    Addendum:  Due to TPN compounder limitations, change to Cl:Ac ratio of 1:2 instead of 1:1.  (Compounder was unable to add volume < 0.48mL of Sodium Acetate.) 3m PharmD, BCPS 05/28/2020 12:25 PM

## 2020-05-28 NOTE — Progress Notes (Signed)
This nurse attempted to give patient a bath, patient became very agitated, attempting to kick staff. When asked if a bath could be performed patient clearly shook head signaling she didn't want a bath, nor would allow this nurse to perform oral care.   Patient not visually dirty, will continue to monitor patient.

## 2020-05-28 NOTE — Progress Notes (Signed)
  Echocardiogram 2D Echocardiogram has been performed.  Lisa Murray 05/28/2020, 8:46 AM

## 2020-05-28 NOTE — Progress Notes (Signed)
OT Cancellation Note  Patient Details Name: Lisa Murray MRN: [id number] DOB: 1930-11-07   Cancelled Treatment:    Reason Eval/Treat Not Completed: Patient not medically ready. OT order received but eval deferred this date.  Pt with BP 84/51, on vent and in restraints 2* combativeness with nursing.  Will follow.  Lisa Murray 05/28/2020, 3:09 PM

## 2020-05-28 NOTE — Progress Notes (Signed)
eLink Physician-Brief Progress Note Patient Name: JESELLE HISER DOB: 12-Sep-1930 MRN: [id number]   Date of Service  05/28/2020  HPI/Events of Note  K+ 2.8  eICU Interventions  Elink adult electrolyte replacement protocol for K+  ordered.        Thomasene Lot Shain Pauwels 05/28/2020, 5:59 AM

## 2020-05-29 ENCOUNTER — Inpatient Hospital Stay (HOSPITAL_COMMUNITY): Payer: PPO | Admitting: Certified Registered"

## 2020-05-29 ENCOUNTER — Inpatient Hospital Stay (HOSPITAL_COMMUNITY): Payer: PPO

## 2020-05-29 ENCOUNTER — Encounter (HOSPITAL_COMMUNITY): Payer: Self-pay | Admitting: General Surgery

## 2020-05-29 DIAGNOSIS — E878 Other disorders of electrolyte and fluid balance, not elsewhere classified: Secondary | ICD-10-CM

## 2020-05-29 LAB — CBC
HCT: 23 % — ABNORMAL LOW (ref 36.0–46.0)
Hemoglobin: 7.4 g/dL — ABNORMAL LOW (ref 12.0–15.0)
MCH: 31.9 pg (ref 26.0–34.0)
MCHC: 32.2 g/dL (ref 30.0–36.0)
MCV: 99.1 fL (ref 80.0–100.0)
Platelets: 179 10*3/uL (ref 150–400)
RBC: 2.32 MIL/uL — ABNORMAL LOW (ref 3.87–5.11)
RDW: 14.5 % (ref 11.5–15.5)
WBC: 15.8 10*3/uL — ABNORMAL HIGH (ref 4.0–10.5)
nRBC: 0.4 % — ABNORMAL HIGH (ref 0.0–0.2)

## 2020-05-29 LAB — BASIC METABOLIC PANEL
Anion gap: 10 (ref 5–15)
BUN: 22 mg/dL (ref 8–23)
CO2: 24 mmol/L (ref 22–32)
Calcium: 7.6 mg/dL — ABNORMAL LOW (ref 8.9–10.3)
Chloride: 107 mmol/L (ref 98–111)
Creatinine, Ser: 0.74 mg/dL (ref 0.44–1.00)
GFR, Estimated: >60 mL/min (ref >60)
Glucose, Bld: 184 mg/dL — ABNORMAL HIGH (ref 70–99)
Potassium: 2.8 mmol/L — ABNORMAL LOW (ref 3.5–5.1)
Sodium: 141 mmol/L (ref 135–145)

## 2020-05-29 LAB — GLUCOSE, CAPILLARY
Glucose-Capillary: 104 mg/dL — ABNORMAL HIGH (ref 70–99)
Glucose-Capillary: 113 mg/dL — ABNORMAL HIGH (ref 70–99)
Glucose-Capillary: 169 mg/dL — ABNORMAL HIGH (ref 70–99)
Glucose-Capillary: 37 mg/dL — CL (ref 70–99)
Glucose-Capillary: 77 mg/dL (ref 70–99)
Glucose-Capillary: 79 mg/dL (ref 70–99)

## 2020-05-29 LAB — TRIGLYCERIDES: Triglycerides: 112 mg/dL (ref <150)

## 2020-05-29 LAB — MAGNESIUM: Magnesium: 1.5 mg/dL — ABNORMAL LOW (ref 1.7–2.4)

## 2020-05-29 LAB — PHOSPHORUS: Phosphorus: 2.4 mg/dL — ABNORMAL LOW (ref 2.5–4.6)

## 2020-05-29 LAB — BRAIN NATRIURETIC PEPTIDE: B Natriuretic Peptide: 988 pg/mL — ABNORMAL HIGH (ref 0.0–100.0)

## 2020-05-29 MED ORDER — TRAVASOL 10 % IV SOLN
INTRAVENOUS | Status: AC
  Filled 2020-05-29: qty 462

## 2020-05-29 MED ORDER — DEXTROSE 50 % IV SOLN
INTRAVENOUS | Status: AC
  Administered 2020-05-29: 04:00:00 50 mL
  Filled 2020-05-29: qty 50

## 2020-05-29 MED ORDER — POTASSIUM CHLORIDE 10 MEQ/50ML IV SOLN
10.0000 meq | INTRAVENOUS | Status: AC
  Administered 2020-05-29 (×6): 10 meq via INTRAVENOUS
  Filled 2020-05-29 (×6): qty 50

## 2020-05-29 MED ORDER — MAGNESIUM SULFATE 4 GM/100ML IV SOLN
4.0000 g | INTRAVENOUS | Status: AC
  Administered 2020-05-29: 10:00:00 4 g via INTRAVENOUS
  Filled 2020-05-29: qty 100

## 2020-05-29 MED ORDER — POTASSIUM CHLORIDE 10 MEQ/50ML IV SOLN
10.0000 meq | INTRAVENOUS | Status: AC
  Administered 2020-05-29 – 2020-05-30 (×4): 10 meq via INTRAVENOUS
  Filled 2020-05-29 (×4): qty 50

## 2020-05-29 MED ORDER — MAGNESIUM SULFATE 4 GM/100ML IV SOLN
4.0000 g | Freq: Once | INTRAVENOUS | Status: DC
  Filled 2020-05-29: qty 100

## 2020-05-29 MED ORDER — MAGNESIUM SULFATE 2 GM/50ML IV SOLN
2.0000 g | Freq: Once | INTRAVENOUS | Status: AC
  Administered 2020-05-29: 19:00:00 2 g via INTRAVENOUS
  Filled 2020-05-29: qty 50

## 2020-05-29 MED ORDER — DEXTROSE 50 % IV SOLN: 25.0000 g | INTRAVENOUS | Status: AC

## 2020-05-29 MED ORDER — SODIUM CHLORIDE 0.9 % IV SOLN: INTRAVENOUS | Status: DC | PRN

## 2020-05-29 MED ORDER — CEFAZOLIN SODIUM-DEXTROSE 2-4 GM/100ML-% IV SOLN
2.0000 g | Freq: Three times a day (TID) | INTRAVENOUS | Status: DC
  Administered 2020-05-29 – 2020-05-31 (×6): 2 g via INTRAVENOUS
  Filled 2020-05-29 (×6): qty 100

## 2020-05-29 MED ORDER — POTASSIUM PHOSPHATES 15 MMOLE/5ML IV SOLN
15.0000 mmol | Freq: Once | INTRAVENOUS | Status: AC
  Administered 2020-05-29: 12:00:00 15 mmol via INTRAVENOUS
  Filled 2020-05-29: qty 5

## 2020-05-29 MED ORDER — CEFAZOLIN SODIUM-DEXTROSE 2-4 GM/100ML-% IV SOLN
2.0000 g | Freq: Two times a day (BID) | INTRAVENOUS | Status: DC
  Filled 2020-05-29: qty 100

## 2020-05-29 MED ORDER — MIDAZOLAM HCL 2 MG/2ML IJ SOLN
0.5000 mg | Freq: Two times a day (BID) | INTRAMUSCULAR | Status: DC
  Administered 2020-05-29: 22:00:00 0.5 mg via INTRAVENOUS
  Filled 2020-05-29: qty 2

## 2020-05-29 MED ORDER — PHENYLEPHRINE CONCENTRATED 100MG/250ML (0.4 MG/ML) INFUSION SIMPLE
0.0000 ug/min | INTRAVENOUS | Status: DC
  Administered 2020-05-29: 180 ug/min via INTRAVENOUS
  Administered 2020-05-29: 400 ug/min via INTRAVENOUS
  Administered 2020-05-29 – 2020-05-30 (×2): 380 ug/min via INTRAVENOUS
  Administered 2020-05-30: 370 ug/min via INTRAVENOUS
  Administered 2020-05-30: 275 ug/min via INTRAVENOUS
  Administered 2020-05-30: 400 ug/min via INTRAVENOUS
  Administered 2020-05-31: 70 ug/min via INTRAVENOUS
  Administered 2020-06-02: 20 ug/min via INTRAVENOUS
  Filled 2020-05-29 (×11): qty 250

## 2020-05-29 MED ORDER — CEFAZOLIN SODIUM-DEXTROSE 2-4 GM/100ML-% IV SOLN: 2.0000 g | Freq: Two times a day (BID) | INTRAVENOUS | Status: DC

## 2020-05-29 MED ORDER — FUROSEMIDE 10 MG/ML IJ SOLN
60.0000 mg | Freq: Four times a day (QID) | INTRAMUSCULAR | Status: AC
  Administered 2020-05-29 – 2020-05-30 (×4): 60 mg via INTRAVENOUS
  Filled 2020-05-29 (×4): qty 6

## 2020-05-29 NOTE — Progress Notes (Signed)
PHARMACY - TOTAL PARENTERAL NUTRITION CONSULT NOTE   Indication: Prolonged ileus s/p bowel resection  Patient Measurements: Height: 5\' 2"  (157.5 cm) Weight: 49.9 kg (110 lb 0.2 oz) IBW/kg (Calculated) : 50.1 TPN AdjBW (KG): 50.5 Body mass index is 20.12 kg/m.  Assessment: [name] s/p small bowel resection. Unable to place NGT in IR due to large hiatal hernia. Pharmacy consulted to initiate TPN for prolonged malnutrition/ileus.   Glucose / Insulin: Goal 150-180 on sSSI q 4 h (no insulin given / 24 hrs).  CBGs 37-169.  Low CBG 37, per MD notes, suspect pressor-related erroneous value, was given D50, with subsequent CBG 169. Electrolytes: appears to be refeeding syndrome.  K down to 2.8 despite replacement, Mag 1.5, Phos 2.4.  CorrCa WNL Renal: SCr stable LFTs / TGs: AST/ALT elevated and increased to 134/105, Tbili decreased.  TG 94 - propofol @30  mcg/kg/min. Prealbumin / albumin: 5.5/2.6 (12/10) Intake / Output; MIVF: Large UOP on lasix, 5.9L/24 hours.  NS @ KVO.  Lasix dose at 0600 held per MD for large UOP, soft BP, hypokalemia. GI Imaging: 12/5 CT: closed loop SBO, large hiatal hernia Surgeries / Procedures:  12/5 ex-lap, small bowel resection 12/9 NG tube replaced (currently in hiatal hernia).  LIWS till return of bowel function.  Central access:  Triple lumen PICC 12/9 TPN start date: 12/9  Nutritional Goals (per RD recommendation on 12/9) Kcal:  1034 kcal; Protein:  95-105 grams; Fluid:  >/= 1.7 L/day Goal TPN rate is 75 mL/hr (provides 99 g of protein and 1040 kcals per day)  Current Nutrition:  NPO  Plan:  Now:  Mag 4g IV over 2 hours KCl 10 mEq IV runs x6 Kphos 15 mmol (provides ~22 mEq K+)  At 1800: Continue TPN at 35 mL/hr, do not advance rate due to severe hypokalemia Electrolytes in TPN: 68mEq/L of Na, 74mEq/L of K, 82mEq/L of Ca, 92mEq/L of Mg, and 48mmol/L of Phos. Cl:Ac ratio 1:2 - Due to TPN compounder limitations, IV room RPh requires change to Cl:Ac ratio  of 1:2 instead of 1:1.  (Compounder was unable to add volume < 0.105mL of Sodium Acetate.)  Add standard MVI and trace elements to TPN Continue Sensitive q4h SSI and adjust as needed  Continue NS @ KVO Monitor TPN labs on Mon/Thurs.  Bmet, mag, phos with AM labs.   12m PharmD, BCPS Clinical Pharmacist [facility] main pharmacy [phone] 05/29/2020 7:21 AM

## 2020-05-29 NOTE — Progress Notes (Signed)
eLink Physician-Brief Progress Note Patient Name: Lisa Murray DOB: 12/01/1930 MRN: [id number]   Date of Service  05/29/2020  HPI/Events of Note  RN reports abnormal values of K 2.8, phos 2.4, Mg 1.5.  eICU Interventions  Repletion per ICU electrolyte scale.     Intervention Category Minor Interventions: Electrolytes abnormality - evaluation and management  Janae Bridgeman 05/29/2020, 6:31 AM

## 2020-05-29 NOTE — Progress Notes (Signed)
PT Cancellation Note  Patient Details Name: Lisa Murray MRN: [id number] DOB: August 10, 1930   Cancelled Treatment:    Reason Eval/Treat Not Completed: Medical issues which prohibited therapy Pt remains sedated and intubated and on pressor support.  Will f/u as able. Anise Salvo, PT Acute Rehab Services Pager [phone] [facility] [phone]    Lisa Murray 05/29/2020, 11:30 AM

## 2020-05-29 NOTE — Progress Notes (Signed)
RT attempted Arterial Line x2 without success. CRNA called by RN

## 2020-05-29 NOTE — Progress Notes (Signed)
6 Days Post-Op   Subjective/Chief Complaint: Pt with some changes in her HR overnight. Req increased pressor support No bowel fxn per RN   Objective: Vital signs in last 24 hours: Temp:  [96.3 F (35.7 C)-98.9 F (37.2 C)] 97.4 F (36.3 C) (12/11 0400) Pulse Rate:  [33-131] 101 (12/11 0600) Resp:  [15-28] 16 (12/11 0600) BP: (51-131)/(32-115) 95/61 (12/11 0500) SpO2:  [76 %-100 %] 93 % (12/11 0600) Arterial Line BP: (110)/(53) 110/53 (12/11 0600) FiO2 (%):  [30 %] 30 % (12/11 0400) Last BM Date:  (PTA)  Intake/Output from previous day: 12/10 0701 - 12/11 0700 In: 5458.3 [I.V.:4758.5; IV Piggyback:699.8] Out: 5900 [Urine:5900] Intake/Output this shift: No intake/output data recorded.  General appearance: sedated on Vent GI: soft, inc c/d/i, hypoactuve BS  Lab Results:  Recent Labs    05/28/20 0257 05/29/20 0416  WBC 14.3* 15.8*  HGB 7.9* 7.4*  HCT 23.7* 23.0*  PLT 165 179   BMET Recent Labs    05/28/20 1700 05/29/20 0416  NA 140 141  K 4.1 2.8*  CL 106 107  CO2 25 24  GLUCOSE 107* 184*  BUN 19 22  CREATININE 0.74 0.74  CALCIUM 8.5* 7.6*   PT/INR No results for input(s): LABPROT, INR in the last 72 hours. ABG Recent Labs    05/26/20 1954  HCO3 23.8    Studies/Results: DG Chest 1 View  Result Date: 05/27/2020 CLINICAL DATA:  Recent small bowel resection and exploratory laparotomy for closed loop obstruction. Stridor after extubation resulting re-intubation. EXAM: CHEST  1 VIEW COMPARISON:  05/26/2020 FINDINGS: Endotracheal tube tip 4.5 cm above the carina. Nasogastric tube tip is in the hiatal hernia, intrathoracic, with side port also probably in the hiatal hernia. However, the extensive coiling of the nasogastric tube previously shown has resolved and the tube is oriented caudad. Left PICC line tip: SVC. Atherosclerotic calcification of the aortic arch. The patient is rotated to the left on today's radiograph, reducing diagnostic sensitivity and  specificity. Retrocardiac opacity is likely partially from the known hiatal hernia, although I cannot exclude a component of left lower lobe airspace opacity. The right lung appears clear. Small ossific densities a likely in the axillary pouch region of the right glenohumeral joint. L1 compression fracture, chronic. Left posterior rib deformities compatible with fractures with healing response. IMPRESSION: 1. Endotracheal tube tip 4.5 cm above the carina. 2. Nasogastric tube tip is in the hiatal hernia, with side port also probably in the hiatal hernia. 3. Retrocardiac opacity, likely partially from the known hiatal hernia, although I cannot exclude a component of left lower lobe airspace opacity. Electronically Signed   By: Gaylyn RongWalter  Liebkemann M.D.   On: 05/27/2020 16:42   DG Abd Portable 1V  Result Date: 05/27/2020 CLINICAL DATA:  Nasogastric tube placement EXAM: PORTABLE ABDOMEN - 1 VIEW COMPARISON:  05/26/2020 FINDINGS: The nasogastric tube is no longer coiled in the hiatal hernia, the tip is still in the hiatal hernia and is oriented caudad. The side port is also probably in the hiatal hernia or near the GE junction. Levoconvex lumbar scoliosis. L1 compression fracture. Skin clips are present along the lower abdomen. IMPRESSION: 1. The nasogastric tube is no longer coiled in the hiatal hernia, with the tip still in the hiatal hernia and the side port probably in the hiatal hernia or near the GE junction. Electronically Signed   By: Gaylyn RongWalter  Liebkemann M.D.   On: 05/27/2020 17:03   ECHOCARDIOGRAM COMPLETE  Result Date: 05/28/2020  ECHOCARDIOGRAM REPORT   Patient Name:   Lisa Murray Date of Exam: 05/28/2020 Medical Rec #:  [id number]     Height:       62.0 in Accession #:    [id number]    Weight:       110.0 lb Date of Birth:  19-Sep-1930     BSA:          1.483 m Patient Age:    86 years      BP:           118/58 mmHg Patient Gender: F             HR:           66 bpm. Exam Location:  Inpatient  Procedure: 2D Echo, Cardiac Doppler and Color Doppler Indications:    R94.31 Abnormal EKG  History:        Patient has prior history of Echocardiogram examinations, most                 recent 10/19/2016. Hypothyroidism.  Sonographer:    Elmarie Shiley Dance Referring Phys: [id number] Steffanie Dunn  Sonographer Comments: Echo performed with patient supine and on artificial respirator. Image acquisition challenging due to uncooperative patient. IMPRESSIONS  1. Left ventricular ejection fraction, by estimation, is 65 to 70%. The left ventricle has normal function. The left ventricle has no regional wall motion abnormalities. Left ventricular diastolic parameters are consistent with Grade I diastolic dysfunction (impaired relaxation).  2. Right ventricular systolic function is normal. The right ventricular size is normal. There is mildly elevated pulmonary artery systolic pressure. The estimated right ventricular systolic pressure is 37.8 mmHg.  3. The mitral valve is normal in structure. Moderate to severe mitral valve regurgitation. No evidence of mitral stenosis.  4. Tricuspid valve regurgitation is severe.  5. The aortic valve is normal in structure. Aortic valve regurgitation is not visualized. No aortic stenosis is present.  6. The inferior vena cava is normal in size with greater than 50% respiratory variability, suggesting right atrial pressure of 3 mmHg. FINDINGS  Left Ventricle: Left ventricular ejection fraction, by estimation, is 65 to 70%. The left ventricle has normal function. The left ventricle has no regional wall motion abnormalities. The left ventricular internal cavity size was normal in size. There is  no left ventricular hypertrophy. Left ventricular diastolic parameters are consistent with Grade I diastolic dysfunction (impaired relaxation). Right Ventricle: The right ventricular size is normal. No increase in right ventricular wall thickness. Right ventricular systolic function is normal. There is mildly  elevated pulmonary artery systolic pressure. The tricuspid regurgitant velocity is 2.73  m/s, and with an assumed right atrial pressure of 8 mmHg, the estimated right ventricular systolic pressure is 37.8 mmHg. Left Atrium: Left atrial size was normal in size. Right Atrium: Right atrial size was normal in size. Pericardium: There is no evidence of pericardial effusion. Mitral Valve: The mitral valve is normal in structure. Moderate to severe mitral valve regurgitation, with posteriorly-directed jet. No evidence of mitral valve stenosis. Tricuspid Valve: The tricuspid valve is normal in structure. Tricuspid valve regurgitation is severe. No evidence of tricuspid stenosis. Aortic Valve: The aortic valve is normal in structure. Aortic valve regurgitation is not visualized. No aortic stenosis is present. Pulmonic Valve: The pulmonic valve was normal in structure. Pulmonic valve regurgitation is not visualized. No evidence of pulmonic stenosis. Aorta: The aortic root is normal in size and structure. Venous: The inferior vena cava is normal in size with  greater than 50% respiratory variability, suggesting right atrial pressure of 3 mmHg. IAS/Shunts: No atrial level shunt detected by color flow Doppler.  LEFT VENTRICLE PLAX 2D LVIDd:         3.80 cm LVIDs:         2.20 cm LV PW:         0.90 cm LV IVS:        0.90 cm LVOT diam:     1.70 cm LV SV:         48 LV SV Index:   32 LVOT Area:     2.27 cm  RIGHT VENTRICLE RV Basal diam:  2.50 cm TAPSE (M-mode): 1.5 cm LEFT ATRIUM             Index       RIGHT ATRIUM          Index LA diam:        2.90 cm 1.96 cm/m  RA Area:     8.65 cm LA Vol (A2C):   57.7 ml 38.91 ml/m RA Volume:   13.00 ml 8.77 ml/m LA Vol (A4C):   27.7 ml 18.68 ml/m LA Biplane Vol: 40.9 ml 27.58 ml/m  AORTIC VALVE LVOT Vmax:   129.00 cm/s LVOT Vmean:  69.350 cm/s LVOT VTI:    0.210 m  AORTA Ao Root diam: 3.20 cm Ao Asc diam:  3.50 cm MITRAL VALVE               TRICUSPID VALVE MV Area (PHT): 3.17 cm     TR Peak grad:   29.8 mmHg MV Decel Time: 239 msec    TR Vmax:        273.00 cm/s MV E velocity: 93.40 cm/s MV A velocity: 86.60 cm/s  SHUNTS MV E/A ratio:  1.08        Systemic VTI:  0.21 m                            Systemic Diam: 1.70 cm Donato Schultz MD Electronically signed by Donato Schultz MD Signature Date/Time: 05/28/2020/10:32:32 AM    Final    [place] EKG SITE RITE  Result Date: 05/27/2020 If Site Rite image not attached, placement could not be confirmed due to current cardiac rhythm.   Anti-infectives: Anti-infectives (From admission, onward)   Start     Dose/Rate Route Frequency Ordered Stop   05/27/20 2000  vancomycin (VANCOREADY) IVPB 500 mg/100 mL        500 mg 100 mL/hr over 60 Minutes Intravenous Every 24 hours 05/26/20 1800     05/26/20 2000  vancomycin (VANCOCIN) IVPB 1000 mg/200 mL premix        1,000 mg 200 mL/hr over 60 Minutes Intravenous  Once 05/26/20 1752 05/26/20 2245   05/26/20 1830  ceFEPIme (MAXIPIME) 2 g in sodium chloride 0.9 % 100 mL IVPB        2 g 200 mL/hr over 30 Minutes Intravenous Every 12 hours 05/26/20 1750     05-30-20 2100  cefoTEtan (CEFOTAN) 1 g in sodium chloride 0.9 % 100 mL IVPB        1 g 200 mL/hr over 30 Minutes Intravenous On call to O.R. May 30, 2020 2008 05-30-20 2112      Assessment/Plan: Ms. Hochberg is a 86 year old female who presented with abdominal pain found to have a closed loop small bowel obstruction secondary to an adhesive band s/p exploratory laparotomy with small bowel  resection on 05/19/2020 with Dr. Maisie Fus.  Adhesive closed loop SBO s/p sx as above - NG till LIWS till return of bowel function - Pain control - Awaiting return of bowel function  Lactic acidosis 2/2 ischemic bowel - Cleared on follow up lactic acid level 12/6  Acute blood loss anemia combined with dilutional anemia - HGB stable - monitor hgb and transfuse only if needed  Non-severe moderate degree of malnutrition, present on admission - Continue TPN till  tolerating nutritional requirements PO  Post-extubation stridor requiring reintubation Possible flash pulmonary edema and or pneumonia Atrial fibrillation with rapid ventricular response - Appreciate critical care team's assistance  - Failed extubation 05/26/2020 - Trying to avoid over-sedating prior to a second attempt at extubation - Antibiotics per critical care  Hypothyroidism - home medications  Hyperglycemia due to acute illness - SSI  FEN: IVF, TNA ID: [email or web address] per critical care VTE: SCDs, 30 lovenox Foley: Continue Dispo: Continued ICU care   LOS: 6 days    Axel Filler 05/29/2020

## 2020-05-29 NOTE — Progress Notes (Signed)
eLink Physician-Brief Progress Note Patient Name: SHANDREA LUSK DOB: 1930-07-09 MRN: [id number]   Date of Service  05/29/2020  HPI/Events of Note  Afib with RVR to 130s-140s. Remains on Neo drip (currently at 160 mcg/min). Received a dose of lasix overnight and ended up -2L for past 24 hours. K subsequently 2.8 this AM, and Mg 1.5.   eICU Interventions  Patient is allergic to iodine so we cannot give amiodarone to this patient. AV nodal blockers relatively contraindicated give her shock requiring vasopressors.  Start by repleting K and Mg.  Would hold any further diuresis for now given hypokalemia.  Furthermore, the net negative 2L fluid balance in this patient may not be ideal in setting of her shock. While we can start with electrolyte repletion, I would have a very low threshold to give her back IVF / volume at this point.      Intervention Category Intermediate Interventions: Arrhythmia - evaluation and management  Marveen Reeks Yoav Okane 05/29/2020, 4:58 AM

## 2020-05-29 NOTE — Progress Notes (Signed)
eLink Physician-Brief Progress Note Patient Name: Lisa Murray DOB: 04/01/1931 MRN: [id number]   Date of Service  05/29/2020  HPI/Events of Note  Patient needs repeat labs.  eICU Interventions  BMP, Mg+, Phos ordered.        Thomasene Lot Selen Smucker 05/29/2020, 9:11 PM

## 2020-05-29 NOTE — Anesthesia Procedure Notes (Signed)
Arterial Line Insertion Start/End12/04/2020 5:25 AM Performed by: Jairo Ben, MD, Minerva Ends, CRNA, CRNA  Preanesthetic checklist: patient identified, site marked, surgical consent, monitors and equipment checked and timeout performed radial was placed Catheter size: 20 G Hand hygiene performed  and maximum sterile barriers used   Attempts: 1 Procedure performed without using ultrasound guided technique. Following insertion, Biopatch and dressing applied. Post procedure assessment: normal  Patient tolerated the procedure well with no immediate complications. Additional procedure comments: Called to ICU by RN for A line placement- pt on pressors with hypotension- RN obtained consent from family- RT has attempted prior X2 - pt has large bruising and hematoma on right FA - she has a PICC on left side-- arm board placed and restrain removed- pt sedated with propofol- RN assisted- report given and RN to replace restraint- side rail up. Placed on first attempt.

## 2020-05-29 NOTE — Progress Notes (Addendum)
Pharmacy Antibiotic Note  Lisa Murray is a 86 y.o. female admitted on Jun 19, 2020 with Exploratory lab/ SB resection, now with pneumonia.  Pharmacy was consulted for Vancomycin & Cefepime dosing, narrow to Cefazolin today for suspected MSSA pneumonia.  Per MD, few Klebsiella is likely not pathogenic  Plan: Cefazolin 2g IV q8h to complete 7 days total. Pharmacy to follow up renal dosing and cultures peripherally, but will sign off notes.   Height: 5\' 2"  (157.5 cm) Weight: 49.9 kg (110 lb 0.2 oz) IBW/kg (Calculated) : 50.1  Temp (24hrs), Avg:97.5 F (36.4 C), Min:96.3 F (35.7 C), Max:98.3 F (36.8 C)  Recent Labs  Lab 06-19-20 1721 June 19, 2020 1903 05/24/20 0239 05/24/20 1118 05/25/20 0531 05/25/20 1133 05/26/20 0450 05/26/20 1957 05/27/20 0319 05/27/20 2047 05/28/20 0257 05/28/20 1700 05/29/20 0416  WBC 21.0*  --    < > 17.0*   < > 13.6* 8.9  --  15.9*  --  14.3*  --  15.8*  CREATININE 0.90  --    < >  --    < >  --  0.60   < > 0.72 0.68 0.66 0.74 0.74  LATICACIDVEN 3.0* 3.4*  --  1.5  --   --   --   --   --   --   --   --   --    < > = values in this interval not displayed.    Estimated Creatinine Clearance: 39 mL/min (by C-G formula based on SCr of 0.74 mg/dL).    Allergies  Allergen Reactions  . Iodine Hives  . Codeine Nausea And Vomiting  . Doxycycline Nausea And Vomiting  . Fosamax [Alendronate] Nausea And Vomiting  . Macrobid [Nitrofurantoin] Nausea And Vomiting  . Tramadol Nausea And Vomiting  . Erythromycin Rash  . Morphine And Related Anxiety  . Novocain [Procaine] Anxiety  . Penicillin G Benzathine Rash  . Sulfa Antibiotics Rash    Antimicrobials this admission: 12/8 Cefepime >> 12/11 12/8 Vancomycin >> 12/11 12/11 Cefazolin >>   Dose adjustments this admission:  Microbiology results: 12/5 UCx: < 10K insign growth  12/8 Sputum: few Klebsiella pneumoniae, abundant Staph aureus 12/5 MRSA PCR: neg  Thank you for allowing pharmacy to be a part  of this patient's care.  14/5 PharmD, BCPS Clinical Pharmacist [facility] main pharmacy [phone] 05/29/2020 9:42 AM

## 2020-05-29 NOTE — Progress Notes (Signed)
CRITICAL VALUE ALERT  Critical Value:  CBG 37  Date & Time Notied:  12/11 @ 0330  Provider Notified: E-link  Orders Received/Actions taken: Hypoglycemic protocol followed, one Amp D50 administered

## 2020-05-29 NOTE — Transfer of Care (Signed)
Immediate Anesthesia Transfer of Care Note  Patient: Lisa Murray  Procedure(s) Performed: AN AD HOC LINE PLACEMENT  Patient Location: ICU  Anesthesia Type:Aline placement- RN present and assisted  Level of Consciousness: sedated  Airway & Oxygen Therapy: on vent see RT settings  Post-op Assessment: Report given to RN  Post vital signs: Reviewed  Last Vitals:  Vitals Value Taken Time  BP    Temp    Pulse 141 05/29/20 0536  Resp 16 05/29/20 0536  SpO2 92 % 05/29/20 0536  Vitals shown include unvalidated device data.  Last Pain:  Vitals:   05/29/20 0341  TempSrc: Axillary  PainSc:          Complications: No complications documented.

## 2020-05-29 NOTE — Progress Notes (Signed)
eLink Physician-Brief Progress Note Patient Name: Lisa Murray DOB: January 01, 1931 MRN: [id number]   Date of Service  05/29/2020  HPI/Events of Note  Patient is on pressors but RN unable to obtain reliable BP readings. Requests A-line.  Also, CBG was in 30s despite her being on TPN. Suspect pressor-related erroneous value. Received treatment with D50 amp regardless.   eICU Interventions  Arterial line order entered.  RN to obtain paired readings of serum glucose and CBG to assess for vasopressor-related error in CBG values.     Intervention Category Intermediate Interventions: Other:;Hypotension - evaluation and management  Janae Bridgeman 05/29/2020, 3:46 AM

## 2020-05-29 NOTE — Progress Notes (Signed)
OT Cancellation Note  Patient Details Name: Lisa Murray MRN: [id number] DOB: Apr 13, 1931   Cancelled Treatment:    Reason Eval/Treat Not Completed: Patient not medically ready\  Medical issues which prohibited therapy Pt remains sedated and intubated and on pressor support.  Will f/u as able.  Jeanie Sewer Dorena Bodo, OT Acute Rehabilitation Services [phone] Office- [phone]    05/29/2020, 3:38 PM

## 2020-05-29 NOTE — Progress Notes (Signed)
NAME:  Lisa Murray, MRN:  [id number], DOB:  [birth year], LOS: 6 ADMISSION DATE:  31-May-2020, CONSULTATION DATE:  05-31-20 REFERRING MD:  Romie Levee, CHIEF COMPLAINT:  Stridor  Brief History   86 year old F with a history of hypothyroidism, osteoporosis, admitted 12/5 with acute abdominal pain, n/v in the setting of a closed loop small bowel obstruction. She underwent an ex-lap with small bowel resection, primary anastomosis & lysis of adhesions.  Course complicated by stridor post extubation requiring reintubation, hypotension on vasopressors, anemia and difficulty with placement of feeding tube in the setting of hiatal hernia.     Past Medical History  Hx of Fall, requiring hospitalization and rehab Hypothyroidism Osteoporosis HTN S/p ab hysterectomy, bladder repair Appendectomy  Lives independently at baseline, independent of all ADL's prior to current admit (12/5)  Significant Hospital Events   12/05 Admit with abd pain, s/p ex-lab with small bowel resection & lysis of adhesions 12/08 Extubated, failed / hypoxic, reintubated with observed thick secretions at cords 12/09 Episodes of bradycardia on precedex + neo. Levophed changed to neo in setting of AFwRVR  Consults:  PCCM  Procedures:  ETT 12/5 >>12/8, 12/8 >>  L Radial ALine 12/5 >> 12/8   Significant Diagnostic Tests:  CT abd/pelv 12/5 >> features of high-grade small bowel obstruction a closed loop in the right lower quadrant demonstrating dilatation, thickening and adjacent inflammation and mural hypoattenuation concerning for vascular compromise. Possibly attributable to a post surgical adhesion given history of hysterectomy and appendectomy. Delayed right renal nephrogram with moderate hydroureteronephrosis with the distal ureter deviated towards the right lower quadrant and possibly involved by the obstructive process in the right lower quadrant. Mild mural thickening of the cecum though this is possibly reactive. Large  hiatal hernia with a degree of likely organoaxial twisting with the pylorus remaining below the level of the diaphragm. Distended appearance of the proximal stomach could reflect some partial obstruction. Unchanged compression deformities and vertebral body height loss involving the L1, L2 and L4 levels. Aortic Atherosclerosis. ECHO 12/10 >> mod to severe MR, severe TR, midlly elevated PASP. LVEF 65-70% with G1DD   Micro Data:  COVID 12/5 >> negative  Influenza 12/5 >> negative  UC 12/5 >> less than 10k colonies, insignificant growth Tracheal aspirate 12/8 >> few GNR, abundant staph aureus & few K. Pneumoniae>   Antimicrobials:  Vanco 12/8 >>  Cefepime 12/8 >>   Interim history/subjective:  Agitated much of the day yesterday, difficult to keep in the bed with escalating sedation requirements. Recurrent Afib overnight, so AM lasix held.  Objective   Blood pressure (!) 127/56, pulse (!) 59, temperature 97.7 F (36.5 C), temperature source Axillary, resp. rate 16, height 5\' 2"  (1.575 m), weight 49.9 kg, SpO2 97 %.    Vent Mode: PRVC FiO2 (%):  [30 %] 30 % Set Rate:  [16 bmp] 16 bmp Vt Set:  [400 mL] 400 mL PEEP:  [5 cmH20] 5 cmH20 Plateau Pressure:  [14 cmH20-15 cmH20] 14 cmH20   Intake/Output Summary (Last 24 hours) at 05/29/2020 0851 Last data filed at 05/29/2020 0649 Gross per 24 hour  Intake 5458.28 ml  Output 5900 ml  Net -441.72 ml   Filed Weights   05/31/20 1707 05/24/20 0500 05/27/20 0838  Weight: 50.8 kg 50.5 kg 49.9 kg    Examination: General: critically ill appearing woman laying in bed in NAD HEENT: [place]/AT, eyes anicteric. Neuro: RASS -5, pinpoint pupils, apneic on SBT CV: S1S2, RRR, NSR on tele PULM: Breathing  comfortably on MV, synchronous with the vent. GI: soft, NT, ND Extremities: warm, dry, no cyanosis or edema. Minimal muscle mass. Skin: No rashes, no erythema around abdominal incision. Staples remain in place.  CXR personally reviewed 12/11 >  Rotated,  no right-sided opacities, possibly left basilar opacities, although known large retrocardiac [facility]  Resolved Hospital Problem list   Lactic Acidosis secondary to Bowel Ischemia  Post-extubation stridor requiring reintubation post-op  Assessment & Plan:   SBO, s/p Ex-Lap and partial resection Large hiatal hernia in left hemithorax Moderate malnutrition  -appreciate surgery's assistance -NGT in HH, con't to LIWS -con't to hold enteral meds and nutrition until bowel function returns  Acute respiratory failure due to upper airway obstruction Acute pulmonary edema  Staph aureus pneumonia Failed extubation, reintubated for stridor post-op which may have been related to contrast pre-op for imaging, treated with steroids.  Failed again 12/8 with hypoxia, poor effort.  -PRVC 8cc/kg, goal Pplat<30 and DP < 15 -wean PEEP / fiO2 for sats >90% -Daily SBT / WUA- gets very agitated and requires restraints and frequent reassessments. -PAD protocol for RASS 0 to-1. Avoiding benzodiazepines as much as possible -con't lasix  -rechecking BNP level to guide diuresis -deescalate antibiotics to ancef  Shock due to sedation Bradycardia due to precedex Suspect multifactorial in the setting of sedation for mechanical ventilation, AFwithRVR. Cortisol 19.   -con't neosynephrine; had more Afib on norepi. Quad strength neosynephrine. -titrate pressors to maintain MAP>65  Paroxysmal A- fib w RVR; currently in sinus -con't tele monitoring  -May need to transition to systemic AC if still having runs of Afib- will consider lovenox. -lasix as above  -TSH WNL  Hypokalemia  Hypophosphatemia  Refeeding syndrome -con't to monitor and replete aggressively -goal K >4, Mg >2 with AFib  Hypothyroidism -continue IV synthroid until able to take PO's   Hyper / Hypogylcemia  -not requiring insulin -goal BG 140-180 if requiring insulin while hospitalized  Acute anemia Suspect multifactorial in setting of operative  blood loss and dilutional component  -serial CBC  -transfuse for Hgb <7% or hemodynamically significant bleeding  Moderate Protein Calorie Malnutrition Refeeding Syndrome -TPN per pharmacy -aggressive electrolyte repletion   Best practice (evaluated daily)  Diet: NPO Pain/Anxiety/Delirium protocol (if indicated): yes VAP protocol (if indicated): yes DVT prophylaxis: lovenox  GI prophylaxis: protonix Glucose control: SSI Mobility: progressive Last date of multidisciplinary goals of care discussion: per primary team.  Message left for Dan Maker 12/10 for return call.  Family and staff present: Summary of discussion: Follow up goals of care discussion due:  Code Status: Full Code  Disposition: ICU   Labs   CBC: Recent Labs  Lab 06/03/20 1721 Jun 03, 2020 2151 05/25/20 1133 05/26/20 0450 05/26/20 1644 05/26/20 1954 05/27/20 0319 05/28/20 0257 05/29/20 0416  WBC 21.0*   < > 13.6* 8.9  --   --  15.9* 14.3* 15.8*  NEUTROABS 18.7*  --   --  7.7  --   --   --  11.6*  --   HGB 14.9   < > 7.4* 7.0* 8.6* 9.5* 8.4* 7.9* 7.4*  HCT 46.4*   < > 23.2* 21.7* 27.8* 29.9* 26.1* 23.7* 23.0*  MCV 98.7   < > 101.3* 100.5*  --   --  97.0 96.3 99.1  PLT 265   < > 135* 112*  --   --  152 165 179   < > = values in this interval not displayed.    Basic Metabolic Panel: Recent Labs  Lab 05/26/20 0450 05/26/20 1954 05/27/20 0319 05/27/20 2047 05/28/20 0257 05/28/20 1700 05/29/20 0416  NA 144   < > 142 139 141 140 141  K 3.2*   < > 3.0* 3.2* 2.8* 4.1 2.8*  CL 116*   < > 109 101 104 106 107  CO2 19*   < > 22 26 25 25 24   GLUCOSE 75   < > 150* 161* 134* 107* 184*  BUN 25*   < > 17 16 17 19 22   CREATININE 0.60   < > 0.72 0.68 0.66 0.74 0.74  CALCIUM 8.6*   < > 8.9 8.9 8.3* 8.5* 7.6*  MG 2.0  --   --  2.3 1.9 1.7 1.5*  PHOS 1.5*   < > 1.8* 3.3 2.8 2.3* 2.4*   < > = values in this interval not displayed.   GFR: Estimated Creatinine Clearance: 39 mL/min (by C-G formula based on SCr of  0.74 mg/dL). Recent Labs  Lab 06/12/2020 1721 05/22/2020 1903 05/24/20 0239 05/24/20 1118 05/25/20 0531 05/26/20 0450 05/27/20 0319 05/28/20 0257 05/29/20 0416  WBC 21.0*  --    < > 17.0*   < > 8.9 15.9* 14.3* 15.8*  LATICACIDVEN 3.0* 3.4*  --  1.5  --   --   --   --   --    < > = values in this interval not displayed.    Liver Function Tests: Recent Labs  Lab 06/18/2020 1721 05/26/20 1957 05/27/20 0319 05/28/20 0257  AST 26 81* 53* 134*  ALT 17 32 29 105*  ALKPHOS 48 64 56 93  BILITOT 0.7 2.9* 2.2* 1.8*  PROT 7.3 6.4* 5.7* 5.6*  ALBUMIN 4.0 3.4* 2.9* 2.6*   Recent Labs  Lab 05/28/2020 1721  LIPASE 21   No results for input(s): AMMONIA in the last 168 hours.  ABG    Component Value Date/Time   PHART 7.355 05/24/2020 0558   PCO2ART 38.5 05/24/2020 0558   PO2ART 431 (H) 05/24/2020 0558   HCO3 23.8 05/26/2020 1954   TCO2 23 05/26/2020 2151   ACIDBASEDEF 2.0 05/26/2020 1954   O2SAT 29.7 05/26/2020 1954     Coagulation Profile: Recent Labs  Lab 05/25/20 0841  INR 1.6*    Cardiac Enzymes: No results for input(s): CKTOTAL, CKMB, CKMBINDEX, TROPONINI in the last 168 hours.  HbA1C: No results found for: HGBA1C  CBG: Recent Labs  Lab 05/28/20 2334 05/29/20 0334 05/29/20 0400 05/29/20 0410 05/29/20 0749  GLUCAP 89 37* 77 169* 79    This patient is critically ill with multiple organ system failure which requires frequent high complexity decision making, assessment, support, evaluation, and titration of therapies. This was completed through the application of advanced monitoring technologies and extensive interpretation of multiple databases. During this encounter critical care time was devoted to patient care services described in this note for 35 minutes.  14/11/21, DO 05/29/20 9:51 AM [facility]

## 2020-05-30 ENCOUNTER — Inpatient Hospital Stay (HOSPITAL_COMMUNITY): Payer: PPO

## 2020-05-30 DIAGNOSIS — J1520 Pneumonia due to staphylococcus, unspecified: Secondary | ICD-10-CM

## 2020-05-30 LAB — BASIC METABOLIC PANEL
Anion gap: 13 (ref 5–15)
Anion gap: 14 (ref 5–15)
Anion gap: 16 — ABNORMAL HIGH (ref 5–15)
BUN: 30 mg/dL — ABNORMAL HIGH (ref 8–23)
BUN: 34 mg/dL — ABNORMAL HIGH (ref 8–23)
BUN: 47 mg/dL — ABNORMAL HIGH (ref 8–23)
CO2: 26 mmol/L (ref 22–32)
CO2: 26 mmol/L (ref 22–32)
CO2: 28 mmol/L (ref 22–32)
Calcium: 8.7 mg/dL — ABNORMAL LOW (ref 8.9–10.3)
Calcium: 9.2 mg/dL (ref 8.9–10.3)
Calcium: 9.1 mg/dL (ref 8.9–10.3)
Chloride: 101 mmol/L (ref 98–111)
Chloride: 97 mmol/L — ABNORMAL LOW (ref 98–111)
Chloride: 97 mmol/L — ABNORMAL LOW (ref 98–111)
Creatinine, Ser: 1.01 mg/dL — ABNORMAL HIGH (ref 0.44–1.00)
Creatinine, Ser: 1.04 mg/dL — ABNORMAL HIGH (ref 0.44–1.00)
Creatinine, Ser: 1.15 mg/dL — ABNORMAL HIGH (ref 0.44–1.00)
GFR, Estimated: 54 mL/min — ABNORMAL LOW (ref >60)
GFR, Estimated: 52 mL/min — ABNORMAL LOW (ref >60)
GFR, Estimated: 46 mL/min — ABNORMAL LOW (ref >60)
Glucose, Bld: 114 mg/dL — ABNORMAL HIGH (ref 70–99)
Glucose, Bld: 119 mg/dL — ABNORMAL HIGH (ref 70–99)
Glucose, Bld: 151 mg/dL — ABNORMAL HIGH (ref 70–99)
Potassium: 4.6 mmol/L (ref 3.5–5.1)
Potassium: 4.5 mmol/L (ref 3.5–5.1)
Potassium: 3.9 mmol/L (ref 3.5–5.1)
Sodium: 140 mmol/L (ref 135–145)
Sodium: 137 mmol/L (ref 135–145)
Sodium: 141 mmol/L (ref 135–145)

## 2020-05-30 LAB — PHOSPHORUS
Phosphorus: 4.9 mg/dL — ABNORMAL HIGH (ref 2.5–4.6)
Phosphorus: 5.3 mg/dL — ABNORMAL HIGH (ref 2.5–4.6)

## 2020-05-30 LAB — CULTURE, RESPIRATORY W GRAM STAIN

## 2020-05-30 LAB — BLOOD GAS, ARTERIAL
Acid-Base Excess: 5.4 mmol/L — ABNORMAL HIGH (ref 0.0–2.0)
Bicarbonate: 28.1 mmol/L — ABNORMAL HIGH (ref 20.0–28.0)
Drawn by: 270211
FIO2: 44
O2 Saturation: 91.8 %
Patient temperature: 98.6
pCO2 arterial: 33.8 mmHg (ref 32.0–48.0)
pH, Arterial: 7.53 — ABNORMAL HIGH (ref 7.350–7.450)
pO2, Arterial: 60.8 mmHg — ABNORMAL LOW (ref 83.0–108.0)

## 2020-05-30 LAB — GLUCOSE, CAPILLARY
Glucose-Capillary: 98 mg/dL (ref 70–99)
Glucose-Capillary: 89 mg/dL (ref 70–99)
Glucose-Capillary: 120 mg/dL — ABNORMAL HIGH (ref 70–99)
Glucose-Capillary: 113 mg/dL — ABNORMAL HIGH (ref 70–99)
Glucose-Capillary: 128 mg/dL — ABNORMAL HIGH (ref 70–99)
Glucose-Capillary: 144 mg/dL — ABNORMAL HIGH (ref 70–99)

## 2020-05-30 LAB — TRIGLYCERIDES: Triglycerides: 204 mg/dL — ABNORMAL HIGH (ref <150)

## 2020-05-30 LAB — MAGNESIUM
Magnesium: 3.4 mg/dL — ABNORMAL HIGH (ref 1.7–2.4)
Magnesium: 3.1 mg/dL — ABNORMAL HIGH (ref 1.7–2.4)

## 2020-05-30 LAB — BRAIN NATRIURETIC PEPTIDE: B Natriuretic Peptide: 775.8 pg/mL — ABNORMAL HIGH (ref 0.0–100.0)

## 2020-05-30 MED ORDER — POTASSIUM CHLORIDE 10 MEQ/100ML IV SOLN
10.0000 meq | INTRAVENOUS | Status: AC
  Administered 2020-05-31 (×2): 10 meq via INTRAVENOUS
  Filled 2020-05-30 (×2): qty 100

## 2020-05-30 MED ORDER — MAGNESIUM SULFATE 2 GM/50ML IV SOLN
2.0000 g | Freq: Once | INTRAVENOUS | Status: AC
  Administered 2020-05-30: 16:00:00 2 g via INTRAVENOUS
  Filled 2020-05-30: qty 50

## 2020-05-30 MED ORDER — LORAZEPAM 2 MG/ML IJ SOLN
0.5000 mg | Freq: Once | INTRAMUSCULAR | Status: AC
  Administered 2020-05-30: 22:00:00 0.5 mg via INTRAVENOUS
  Filled 2020-05-30: qty 1

## 2020-05-30 MED ORDER — METOPROLOL TARTRATE 5 MG/5ML IV SOLN
5.0000 mg | INTRAVENOUS | Status: AC | PRN
  Administered 2020-05-30 – 2020-05-31 (×3): 5 mg via INTRAVENOUS
  Filled 2020-05-30 (×4): qty 5

## 2020-05-30 MED ORDER — ACETAMINOPHEN 650 MG RE SUPP: 650.0000 mg | Freq: Four times a day (QID) | RECTAL | Status: DC | PRN

## 2020-05-30 MED ORDER — DEXMEDETOMIDINE HCL IN NACL 200 MCG/50ML IV SOLN
0.1000 ug/kg/h | INTRAVENOUS | Status: DC
  Administered 2020-05-30: 09:00:00 0.1 ug/kg/h via INTRAVENOUS
  Administered 2020-05-30: 16:00:00 0.3 ug/kg/h via INTRAVENOUS
  Filled 2020-05-30 (×3): qty 50

## 2020-05-30 MED ORDER — ENOXAPARIN SODIUM 60 MG/0.6ML ~~LOC~~ SOLN
1.0000 mg/kg | SUBCUTANEOUS | Status: DC
  Administered 2020-05-30 – 2020-06-01 (×3): 50 mg via SUBCUTANEOUS
  Filled 2020-05-30 (×3): qty 0.5

## 2020-05-30 MED ORDER — POTASSIUM CHLORIDE 10 MEQ/50ML IV SOLN
10.0000 meq | INTRAVENOUS | Status: AC
  Administered 2020-05-30 (×3): 10 meq via INTRAVENOUS
  Filled 2020-05-30 (×3): qty 50

## 2020-05-30 MED ORDER — METOPROLOL TARTRATE 5 MG/5ML IV SOLN
2.5000 mg | Freq: Once | INTRAVENOUS | Status: AC
  Administered 2020-05-30: 14:00:00 2.5 mg via INTRAVENOUS
  Filled 2020-05-30: qty 5

## 2020-05-30 MED ORDER — TRAVASOL 10 % IV SOLN
INTRAVENOUS | Status: AC
  Filled 2020-05-30: qty 726

## 2020-05-30 MED ORDER — VASOPRESSIN 20 UNITS/100 ML INFUSION FOR SHOCK
0.0000 [IU]/min | INTRAVENOUS | Status: DC
  Administered 2020-05-30 – 2020-05-31 (×2): 0.03 [IU]/min via INTRAVENOUS
  Filled 2020-05-30 (×3): qty 100

## 2020-05-30 MED ORDER — FENTANYL CITRATE (PF) 100 MCG/2ML IJ SOLN
12.5000 ug | INTRAMUSCULAR | Status: DC | PRN
  Administered 2020-05-30: 12.5 ug via INTRAVENOUS
  Administered 2020-05-30 – 2020-05-31 (×7): 25 ug via INTRAVENOUS
  Filled 2020-05-30 (×8): qty 2

## 2020-05-30 MED ORDER — ORAL CARE MOUTH RINSE
15.0000 mL | Freq: Two times a day (BID) | OROMUCOSAL | Status: DC
  Administered 2020-05-30 – 2020-06-01 (×6): 15 mL via OROMUCOSAL

## 2020-05-30 MED ORDER — ACETAMINOPHEN 650 MG RE SUPP
650.0000 mg | Freq: Four times a day (QID) | RECTAL | Status: DC
  Administered 2020-05-30 – 2020-05-31 (×2): 650 mg via RECTAL
  Filled 2020-05-30 (×2): qty 1

## 2020-05-30 MED ORDER — FUROSEMIDE 10 MG/ML IJ SOLN
60.0000 mg | Freq: Three times a day (TID) | INTRAMUSCULAR | Status: DC
  Administered 2020-05-30 (×2): 60 mg via INTRAVENOUS
  Filled 2020-05-30 (×3): qty 6

## 2020-05-30 MED ORDER — FUROSEMIDE 10 MG/ML IJ SOLN: 60.0000 mg | Freq: Three times a day (TID) | INTRAMUSCULAR | Status: DC

## 2020-05-30 MED ORDER — CHLORHEXIDINE GLUCONATE 0.12 % MT SOLN
15.0000 mL | Freq: Two times a day (BID) | OROMUCOSAL | Status: DC
  Administered 2020-05-30 – 2020-06-02 (×6): 15 mL via OROMUCOSAL
  Filled 2020-05-30 (×6): qty 15

## 2020-05-30 NOTE — Progress Notes (Signed)
eLink Physician-Brief Progress Note Patient Name: Lisa Murray DOB: 04/01/1931 MRN: [id number]   Date of Service  05/30/2020  HPI/Events of Note  Nursing concern about hypoxia with Sat = 87% and tachypnea to RR = 35.   eICU Interventions  Plan: 1. Portable CXR STAT. 2. ABG STAT.  3. Please obtain CVP now.  4. Will ask ground team to evaluate the patient at bedside.      Intervention Category Major Interventions: Hypoxemia - evaluation and management  Lenell Antu 05/30/2020, 8:55 PM

## 2020-05-30 NOTE — Plan of Care (Signed)
Persistent Afib with RVR today with hypotension responsive to neosynephrine. No respiratory distress or increased agitation associated with it.   Will start therapeutic lovenox Adding vasopressin 0.03 to neosynephrine con't metoprolol, con't precedex for agitation May need scheduled IV metoprolol. IV dilt is an option if refractory.  Steffanie Dunn, DO 05/30/20 3:22 PM [facility]

## 2020-05-30 NOTE — Progress Notes (Signed)
PHARMACY - TOTAL PARENTERAL NUTRITION CONSULT NOTE   Indication: Prolonged ileus s/p bowel resection  Patient Measurements: Height: 5\' 2"  (157.5 cm) Weight: 49.9 kg (110 lb 0.2 oz) IBW/kg (Calculated) : 50.1 TPN AdjBW (KG): 50.5 Body mass index is 20.12 kg/m.  Assessment: [name] s/p small bowel resection. Unable to place NGT in IR due to large hiatal hernia. Pharmacy consulted to initiate TPN for prolonged malnutrition/ileus.   Glucose / Insulin: Goal 150-180 on sSSI q 4 h (no insulin given / 24 hrs).  CBGs 89-120.   Electrolytes: suspect refeeding syndrome.  K replaced and improved to 4.5, Mag elevated at 3.1, Phos elevated at 5.3, CorrCa 10.3 Renal: SCr increased (0.66 > 1.04) LFTs / TGs: AST/ALT elevated and increased to 134/105, Tbili decreased to 1.8. (12/10).  TG inc to 204, propofol d/c 12/12. Prealbumin / albumin: 5.5/2.6 (12/10) Intake / Output; MIVF: Large UOP on lasix, 6.5L/24 hours.  NS @ KVO.  Lasix completed yesterday, none yet ordered today. GI Imaging: 12/5 CT: closed loop SBO, large hiatal hernia Surgeries / Procedures:  12/5 ex-lap, small bowel resection 12/9 NG tube replaced (currently in hiatal hernia).  LIWS till return of bowel function.  Central access:  Triple lumen PICC 12/9 TPN start date: 12/9  Nutritional Goals (per RD recommendation on 12/9) Kcal:  1034 kcal; Protein:  95-105 grams; Fluid:  >/= 1.7 L/day Goal TPN rate is 75 mL/hr (provides 99 g of protein and 1040 kcals per day)  Current Nutrition:  NPO  Plan:  At 1800: Advance TPN to 55 mL/hr Electrolytes in TPN:  - Continue 71mEq/L of Na - Decrease to 21mEq/L of K (keeps approximately 40 mEq in TPN), 2.40mEq/L of Ca - Remove Mag, Phos - Change to Cl:Ac ratio 1:1 Add standard MVI and trace elements to TPN Continue Sensitive q4h SSI and adjust as needed  Continue NS @ KVO Monitor TPN labs on Mon/Thurs.    4m PharmD, BCPS Clinical Pharmacist [facility] main pharmacy  [phone] 05/30/2020 8:52 AM   Addendum: Lasix 60 mg IV q8h x 3 doses today. Per discussion with Dr. 14/05/2020, will give extra KCl runs today to keep ahead of hypokalemia.   KCl 10 mEq IV x3 runs at 1400 Recheck K tonight at 20:00  Chestine Spore PharmD, BCPS 05/30/2020 11:32 AM

## 2020-05-30 NOTE — Progress Notes (Signed)
7 Days Post-Op   Subjective/Chief Complaint: Pt con't to req increased pressors  No bowel fxn    Objective: Vital signs in last 24 hours: Temp:  [97.4 F (36.3 C)-99.4 F (37.4 C)] 99.4 F (37.4 C) (12/12 0400) Pulse Rate:  [50-79] 79 (12/12 0500) Resp:  [16-18] 16 (12/12 0500) BP: (90-127)/(52-75) 109/75 (12/12 0007) SpO2:  [89 %-97 %] 92 % (12/12 0500) Arterial Line BP: (73-150)/(43-66) 93/54 (12/12 0500) FiO2 (%):  [30 %] 30 % (12/12 0400) Last BM Date:  (PTA)  Intake/Output from previous day: 12/11 0701 - 12/12 0700 In: 3277.9 [I.V.:2698.2; IV Piggyback:579.7] Out: 6475 [Urine:6475] Intake/Output this shift: No intake/output data recorded.  General appearance: sedated on Vent GI: soft, inc c/d/i, hypoactive BS  Lab Results:  Recent Labs    05/28/20 0257 05/29/20 0416  WBC 14.3* 15.8*  HGB 7.9* 7.4*  HCT 23.7* 23.0*  PLT 165 179   BMET Recent Labs    05/29/20 2253 05/30/20 0443  NA 140 137  K 4.6 4.5  CL 101 97*  CO2 26 26  GLUCOSE 114* 119*  BUN 30* 34*  CREATININE 1.01* 1.04*  CALCIUM 8.7* 9.2   Studies/Results: DG CHEST PORT 1 VIEW  Result Date: 05/29/2020 CLINICAL DATA:  Acute respiratory failure with hypoxia. EXAM: PORTABLE CHEST 1 VIEW COMPARISON:  05/27/2020 and prior FINDINGS: Support lines and tubes are unchanged including intrathoracic positioning of the NG tube within the hiatal hernia. Confluent left basilar/retrocardiac opacities are unchanged. No pneumothorax. Trace left pleural effusion. Multilevel spondylosis. IMPRESSION: Retrocardiac opacities and trace left pleural effusion are unchanged. Stable support lines and tubes. Electronically Signed   By: Stana Bunting M.D.   On: 05/29/2020 08:01   ECHOCARDIOGRAM COMPLETE  Result Date: 05/28/2020    ECHOCARDIOGRAM REPORT   Patient Name:   Lisa Murray Date of Exam: 05/28/2020 Medical Rec #:  [id number]     Height:       62.0 in Accession #:    [id number]    Weight:       110.0 lb  Date of Birth:  02/25/1931     BSA:          1.483 m Patient Age:    86 years      BP:           118/58 mmHg Patient Gender: F             HR:           66 bpm. Exam Location:  Inpatient Procedure: 2D Echo, Cardiac Doppler and Color Doppler Indications:    R94.31 Abnormal EKG  History:        Patient has prior history of Echocardiogram examinations, most                 recent 10/19/2016. Hypothyroidism.  Sonographer:    Elmarie Shiley Dance Referring Phys: [id number] Steffanie Dunn  Sonographer Comments: Echo performed with patient supine and on artificial respirator. Image acquisition challenging due to uncooperative patient. IMPRESSIONS  1. Left ventricular ejection fraction, by estimation, is 65 to 70%. The left ventricle has normal function. The left ventricle has no regional wall motion abnormalities. Left ventricular diastolic parameters are consistent with Grade I diastolic dysfunction (impaired relaxation).  2. Right ventricular systolic function is normal. The right ventricular size is normal. There is mildly elevated pulmonary artery systolic pressure. The estimated right ventricular systolic pressure is 37.8 mmHg.  3. The mitral valve is normal in structure. Moderate to severe mitral  valve regurgitation. No evidence of mitral stenosis.  4. Tricuspid valve regurgitation is severe.  5. The aortic valve is normal in structure. Aortic valve regurgitation is not visualized. No aortic stenosis is present.  6. The inferior vena cava is normal in size with greater than 50% respiratory variability, suggesting right atrial pressure of 3 mmHg. FINDINGS  Left Ventricle: Left ventricular ejection fraction, by estimation, is 65 to 70%. The left ventricle has normal function. The left ventricle has no regional wall motion abnormalities. The left ventricular internal cavity size was normal in size. There is  no left ventricular hypertrophy. Left ventricular diastolic parameters are consistent with Grade I diastolic dysfunction  (impaired relaxation). Right Ventricle: The right ventricular size is normal. No increase in right ventricular wall thickness. Right ventricular systolic function is normal. There is mildly elevated pulmonary artery systolic pressure. The tricuspid regurgitant velocity is 2.73  m/s, and with an assumed right atrial pressure of 8 mmHg, the estimated right ventricular systolic pressure is 37.8 mmHg. Left Atrium: Left atrial size was normal in size. Right Atrium: Right atrial size was normal in size. Pericardium: There is no evidence of pericardial effusion. Mitral Valve: The mitral valve is normal in structure. Moderate to severe mitral valve regurgitation, with posteriorly-directed jet. No evidence of mitral valve stenosis. Tricuspid Valve: The tricuspid valve is normal in structure. Tricuspid valve regurgitation is severe. No evidence of tricuspid stenosis. Aortic Valve: The aortic valve is normal in structure. Aortic valve regurgitation is not visualized. No aortic stenosis is present. Pulmonic Valve: The pulmonic valve was normal in structure. Pulmonic valve regurgitation is not visualized. No evidence of pulmonic stenosis. Aorta: The aortic root is normal in size and structure. Venous: The inferior vena cava is normal in size with greater than 50% respiratory variability, suggesting right atrial pressure of 3 mmHg. IAS/Shunts: No atrial level shunt detected by color flow Doppler.  LEFT VENTRICLE PLAX 2D LVIDd:         3.80 cm LVIDs:         2.20 cm LV PW:         0.90 cm LV IVS:        0.90 cm LVOT diam:     1.70 cm LV SV:         48 LV SV Index:   32 LVOT Area:     2.27 cm  RIGHT VENTRICLE RV Basal diam:  2.50 cm TAPSE (M-mode): 1.5 cm LEFT ATRIUM             Index       RIGHT ATRIUM          Index LA diam:        2.90 cm 1.96 cm/m  RA Area:     8.65 cm LA Vol (A2C):   57.7 ml 38.91 ml/m RA Volume:   13.00 ml 8.77 ml/m LA Vol (A4C):   27.7 ml 18.68 ml/m LA Biplane Vol: 40.9 ml 27.58 ml/m  AORTIC VALVE  LVOT Vmax:   129.00 cm/s LVOT Vmean:  69.350 cm/s LVOT VTI:    0.210 m  AORTA Ao Root diam: 3.20 cm Ao Asc diam:  3.50 cm MITRAL VALVE               TRICUSPID VALVE MV Area (PHT): 3.17 cm    TR Peak grad:   29.8 mmHg MV Decel Time: 239 msec    TR Vmax:        273.00 cm/s MV E velocity: 93.40 cm/s  MV A velocity: 86.60 cm/s  SHUNTS MV E/A ratio:  1.08        Systemic VTI:  0.21 m                            Systemic Diam: 1.70 cm Donato Schultz MD Electronically signed by Donato Schultz MD Signature Date/Time: 05/28/2020/10:32:32 AM    Final     Anti-infectives: Anti-infectives (From admission, onward)   Start     Dose/Rate Route Frequency Ordered Stop   05/29/20 1800  ceFAZolin (ANCEF) IVPB 2g/100 mL premix  Status:  Discontinued        2 g 200 mL/hr over 30 Minutes Intravenous Every 12 hours 05/29/20 0951 05/29/20 0954   05/29/20 1800  ceFAZolin (ANCEF) IVPB 2g/100 mL premix  Status:  Discontinued        2 g 200 mL/hr over 30 Minutes Intravenous Every 12 hours 05/29/20 0954 05/29/20 1512   05/29/20 1600  ceFAZolin (ANCEF) IVPB 2g/100 mL premix        2 g 200 mL/hr over 30 Minutes Intravenous Every 8 hours 05/29/20 1512     05/27/20 2000  vancomycin (VANCOREADY) IVPB 500 mg/100 mL  Status:  Discontinued        500 mg 100 mL/hr over 60 Minutes Intravenous Every 24 hours 05/26/20 1800 05/29/20 0951   05/26/20 2000  vancomycin (VANCOCIN) IVPB 1000 mg/200 mL premix        1,000 mg 200 mL/hr over 60 Minutes Intravenous  Once 05/26/20 1752 05/26/20 2245   05/26/20 1830  ceFEPIme (MAXIPIME) 2 g in sodium chloride 0.9 % 100 mL IVPB  Status:  Discontinued        2 g 200 mL/hr over 30 Minutes Intravenous Every 12 hours 05/26/20 1750 05/29/20 0944   06/15/2020 2100  cefoTEtan (CEFOTAN) 1 g in sodium chloride 0.9 % 100 mL IVPB        1 g 200 mL/hr over 30 Minutes Intravenous On call to O.R. 05/19/2020 2008 06/13/2020 2112      Assessment/Plan: s/p Procedure(s): EXPLORATORY LAPAROTOMY WITH SMALL BOWEL  RESECTION (N/A) -con't NGT -TNA -Appreciate CCM mgmt -outlook appears grim   LOS: 7 days    Axel Filler 05/30/2020

## 2020-05-30 NOTE — Progress Notes (Signed)
Abrasion noted on R shin from rubbing of restraint. Abrasion measuring 1x6 by 1 cm.  Charge nurse notified. Restraint removed from right leg and foam placed over abrasion. Full ROM complete and pulses intact distally

## 2020-05-30 NOTE — Plan of Care (Signed)
Extubated to [place]. Mild agitation, but redirectable. No stridor. Complaining of abdominal pain. Precedex remains on 0.3. Will have low dose fentanyl available PRN for pain.  Steffanie Dunn, DO 05/30/20 10:58 AM [facility]

## 2020-05-30 NOTE — Procedures (Signed)
Extubation Procedure Note  Patient Details:   Name: Lisa Murray DOB: Oct 10, 1930 MRN: [id number]   Airway Documentation:  Airway 6.5 mm (Active)  Secured at (cm) 23 cm 05/30/20 0804  Measured From Lips 05/30/20 0804  Secured Location Center 05/30/20 0804  Secured By [employer] Tube Holder 05/30/20 0804  Tube Holder Repositioned Yes 05/30/20 0804  Prone position No 05/30/20 0804  Cuff Pressure (cm H2O) 28 cm H2O 05/30/20 0804  Site Condition Dry 05/30/20 0800   Vent end date: 05/30/20 Vent end time: 1055   Evaluation  O2 sats: 96 Complications: none Patient tolerated procedure well. Bilateral Breath Sounds: Clear,Diminished   Pt able to speak  Per CCM order, pt extubated and placed on Nasal Cannula  Aleza Pew S 05/30/2020, 10:58 AM

## 2020-05-30 NOTE — Progress Notes (Signed)
NAME:  Lisa Murray, MRN:  [id number], DOB:  10-13-1930, LOS: 7 ADMISSION DATE:  06/03/2020, CONSULTATION DATE:  05/26/2020 REFERRING MD:  Romie Levee, CHIEF COMPLAINT:  Stridor  Brief History   86 year old F with a history of hypothyroidism, osteoporosis, admitted 12/5 with acute abdominal pain, n/v in the setting of a closed loop small bowel obstruction. She underwent an ex-lap with small bowel resection, primary anastomosis & lysis of adhesions.  Course complicated by stridor post extubation requiring reintubation, hypotension on vasopressors, anemia and difficulty with placement of feeding tube in the setting of hiatal hernia.     Past Medical History  Hx of Fall, requiring hospitalization and rehab Hypothyroidism Osteoporosis HTN S/p ab hysterectomy, bladder repair Appendectomy  Lives independently at baseline, independent of all ADL's prior to current admit (12/5)  Significant Hospital Events   12/05 Admit with abd pain, s/p ex-lab with small bowel resection & lysis of adhesions 12/08 Extubated, failed / hypoxic, reintubated with observed thick secretions at cords 12/09 Episodes of bradycardia on precedex + neo. Levophed changed to neo in setting of AFwRVR  Consults:  PCCM  Procedures:  ETT 12/5 >>12/8, 12/8 >>  L Radial ALine 12/5 >> 12/8   Significant Diagnostic Tests:  CT abd/pelv 12/5 >> features of high-grade small bowel obstruction a closed loop in the right lower quadrant demonstrating dilatation, thickening and adjacent inflammation and mural hypoattenuation concerning for vascular compromise. Possibly attributable to a post surgical adhesion given history of hysterectomy and appendectomy. Delayed right renal nephrogram with moderate hydroureteronephrosis with the distal ureter deviated towards the right lower quadrant and possibly involved by the obstructive process in the right lower quadrant. Mild mural thickening of the cecum though this is possibly reactive. Large  hiatal hernia with a degree of likely organoaxial twisting with the pylorus remaining below the level of the diaphragm. Distended appearance of the proximal stomach could reflect some partial obstruction. Unchanged compression deformities and vertebral body height loss involving the L1, L2 and L4 levels. Aortic Atherosclerosis. ECHO 12/10 >> mod to severe MR, severe TR, midlly elevated PASP. LVEF 65-70% with G1DD   Micro Data:  COVID 12/5 >> negative  Influenza 12/5 >> negative  UC 12/5 >> less than 10k colonies, insignificant growth Tracheal aspirate 12/8 >> few GNR, abundant staph aureus & few K. Pneumoniae> K pneumonia R ampicillin, otherwise pan-sensitive, staph aureus sensitivities still pending  Antimicrobials:  Vanco 12/8 >> 12/11 Cefepime 12/8 >> 12/11 Cefazolin 12/11  Interim history/subjective:  Remains on sedation and vasopressors. Diuresed well.  Objective   Blood pressure 109/75, pulse 79, temperature 99.4 F (37.4 C), temperature source Axillary, resp. rate 16, height 5\' 2"  (1.575 m), weight 49.9 kg, SpO2 92 %. CVP:  [3 mmHg-4 mmHg] 4 mmHg  Vent Mode: PRVC FiO2 (%):  [30 %] 30 % Set Rate:  [16 bmp] 16 bmp Vt Set:  [400 mL] 400 mL PEEP:  [5 cmH20] 5 cmH20 Plateau Pressure:  [11 cmH20-15 cmH20] 12 cmH20   Intake/Output Summary (Last 24 hours) at 05/30/2020 0757 Last data filed at 05/30/2020 0600 Gross per 24 hour  Intake 3277.88 ml  Output 6475 ml  Net -3197.12 ml  net for admission  Filed Weights   06/12/2020 1707 05/24/20 0500 05/27/20 0838  Weight: 50.8 kg 50.5 kg 49.9 kg    Examination: General: critically ill appearing woman laying in bed in NAD, intubated and sedated HEENT: [place]/AT, eyes anicteric Neuro: RASS -5, pinpoint pupils CV: S1S2, RRR, NSR on tele.  PULM: mild cuff leak, CTAB. Breathing comfortably on MV GI: soft, NT, ND. Incision without erythema. Extremities: warm, dry, no edema Skin: no rashes or erythema.  CVP  BNP 828> 871> 988>  776  Resolved Hospital Problem list   Lactic Acidosis secondary to Bowel Ischemia  Post-extubation stridor requiring reintubation post-op  Assessment & Plan:   Acute respiratory failure due to upper airway obstruction Acute pulmonary edema  Staph aureus pneumonia Post-extubation stridor- resolved Failed extubation post-op, reintubated for stridor post-op which may have been related to contrast pre-op for imaging, treated with steroids.  Failed again 12/8 with hypoxia, poor effort.  -LTVV, 4-8 cc/lg IBW with goal plateau less than 30 and driving pressure less than 15 -Wean PEEP and FiO2 to maintain SPO2.  90% -Daily SAT and SBT.  Remains very difficult to sedate/control agitation. -PAD protocol for RASS 0 to-1. Avoiding benzodiazepines as much as possible -Continue Lasix to maintain euvolemia -continue ancef to complete 7 days of appropriate antibiotics  SBO, s/p Ex-Lap and partial resection Large hiatal hernia in left hemithorax Moderate malnutrition  -appreciate surgery's assistance -NGT in HH, con't to LIWS -con't to hold enteral meds and nutrition until bowel function returns  Shock due to sedation Bradycardia due to precedex Suspect multifactorial in the setting of sedation for mechanical ventilation, AFwithRVR. Cortisol 19.   -con't neosynephrine; had more Afib on norepi. Quad strength neosynephrine.  -titrate pressors to maintain MAP>65. Wean as able.  Paroxysmal A- fib w RVR; currently in sinus -con't tele monitoring  -May need to transition to systemic AC if still having runs of Afib- will consider lovenox. -lasix as above  -TSH WNL  Hypokalemia - resolved Hypophosphatemia - resolved, now hyperphosphatemia Refeeding syndrome -con't to monitor and replete aggressively -goal K >4, Mg >2 with AFib  Hypothyroidism -continue IV synthroid until able to take PO's   Hypergylcemia -controlled -not requiring insulin -con't accuchecks Q4h -goal BG 140-180 if requiring  insulin while hospitalized  Acute anemia Suspect multifactorial in setting of operative blood loss and dilutional component  -periodically monitor CBC  -transfuse for Hgb <7% or hemodynamically significant bleeding  Moderate Protein Calorie Malnutrition Refeeding Syndrome -TPN per pharmacy -aggressive electrolyte repletion   Best practice (evaluated daily)  Diet: NPO Pain/Anxiety/Delirium protocol (if indicated): yes VAP protocol (if indicated): yes DVT prophylaxis: lovenox  GI prophylaxis: protonix Glucose control: SSI Mobility: progressive Last date of multidisciplinary goals of care discussion: per primary team.  Message left for Dan Maker 12/10 for return call.  Family and staff present: Summary of discussion: Follow up goals of care discussion due:  Code Status: Full Code  Disposition: ICU   Labs   CBC: Recent Labs  Lab 06/14/2020 1721 05/24/2020 2151 05/25/20 1133 05/26/20 0450 05/26/20 1644 05/26/20 1954 05/27/20 0319 05/28/20 0257 05/29/20 0416  WBC 21.0*   < > 13.6* 8.9  --   --  15.9* 14.3* 15.8*  NEUTROABS 18.7*  --   --  7.7  --   --   --  11.6*  --   HGB 14.9   < > 7.4* 7.0* 8.6* 9.5* 8.4* 7.9* 7.4*  HCT 46.4*   < > 23.2* 21.7* 27.8* 29.9* 26.1* 23.7* 23.0*  MCV 98.7   < > 101.3* 100.5*  --   --  97.0 96.3 99.1  PLT 265   < > 135* 112*  --   --  152 165 179   < > = values in this interval not displayed.    Basic Metabolic  Panel: Recent Labs  Lab 05/28/20 0257 05/28/20 1700 05/29/20 0416 05/29/20 2253 05/30/20 0443  NA 141 140 141 140 137  K 2.8* 4.1 2.8* 4.6 4.5  CL 104 106 107 101 97*  CO2 25 25 24 26 26   GLUCOSE 134* 107* 184* 114* 119*  BUN 17 19 22  30* 34*  CREATININE 0.66 0.74 0.74 1.01* 1.04*  CALCIUM 8.3* 8.5* 7.6* 8.7* 9.2  MG 1.9 1.7 1.5* 3.4* 3.1*  PHOS 2.8 2.3* 2.4* 4.9* 5.3*   GFR: Estimated Creatinine Clearance: 30 mL/min (A) (by C-G formula based on SCr of 1.04 mg/dL (H)). Recent Labs  Lab 06-05-20 1721 Jun 05, 2020 1903  05/24/20 0239 05/24/20 1118 05/25/20 0531 05/26/20 0450 05/27/20 0319 05/28/20 0257 05/29/20 0416  WBC 21.0*  --    < > 17.0*   < > 8.9 15.9* 14.3* 15.8*  LATICACIDVEN 3.0* 3.4*  --  1.5  --   --   --   --   --    < > = values in this interval not displayed.    Liver Function Tests: Recent Labs  Lab 05-Jun-2020 1721 05/26/20 1957 05/27/20 0319 05/28/20 0257  AST 26 81* 53* 134*  ALT 17 32 29 105*  ALKPHOS 48 64 56 93  BILITOT 0.7 2.9* 2.2* 1.8*  PROT 7.3 6.4* 5.7* 5.6*  ALBUMIN 4.0 3.4* 2.9* 2.6*   Recent Labs  Lab 06/05/2020 1721  LIPASE 21   No results for input(s): AMMONIA in the last 168 hours.  ABG    Component Value Date/Time   PHART 7.355 05/24/2020 0558   PCO2ART 38.5 05/24/2020 0558   PO2ART 431 (H) 05/24/2020 0558   HCO3 23.8 05/26/2020 1954   TCO2 23 06-05-2020 2151   ACIDBASEDEF 2.0 05/26/2020 1954   O2SAT 29.7 05/26/2020 1954     Coagulation Profile: Recent Labs  Lab 05/25/20 0841  INR 1.6*    Cardiac Enzymes: No results for input(s): CKTOTAL, CKMB, CKMBINDEX, TROPONINI in the last 168 hours.  HbA1C: No results found for: HGBA1C  CBG: Recent Labs  Lab 05/29/20 1539 05/29/20 1950 05/29/20 2338 05/30/20 0402 05/30/20 0734  GLUCAP 113* 98 104* 89 120*    This patient is critically ill with multiple organ system failure which requires frequent high complexity decision making, assessment, support, evaluation, and titration of therapies. This was completed through the application of advanced monitoring technologies and extensive interpretation of multiple databases. During this encounter critical care time was devoted to patient care services described in this note for 41 minutes.  14/12/21, DO 05/30/20 8:40 AM [facility]

## 2020-05-31 LAB — COMPREHENSIVE METABOLIC PANEL
ALT: 29 U/L (ref 0–44)
AST: 96 U/L — ABNORMAL HIGH (ref 15–41)
Albumin: 2.7 g/dL — ABNORMAL LOW (ref 3.5–5.0)
Alkaline Phosphatase: 106 U/L (ref 38–126)
Anion gap: 13 (ref 5–15)
BUN: 57 mg/dL — ABNORMAL HIGH (ref 8–23)
CO2: 27 mmol/L (ref 22–32)
Calcium: 9.2 mg/dL (ref 8.9–10.3)
Chloride: 100 mmol/L (ref 98–111)
Creatinine, Ser: 1.28 mg/dL — ABNORMAL HIGH (ref 0.44–1.00)
GFR, Estimated: 41 mL/min — ABNORMAL LOW (ref >60)
Glucose, Bld: 157 mg/dL — ABNORMAL HIGH (ref 70–99)
Potassium: 4 mmol/L (ref 3.5–5.1)
Sodium: 140 mmol/L (ref 135–145)
Total Bilirubin: 0.9 mg/dL (ref 0.3–1.2)
Total Protein: 6.7 g/dL (ref 6.5–8.1)

## 2020-05-31 LAB — GLUCOSE, CAPILLARY
Glucose-Capillary: 121 mg/dL — ABNORMAL HIGH (ref 70–99)
Glucose-Capillary: 123 mg/dL — ABNORMAL HIGH (ref 70–99)
Glucose-Capillary: 125 mg/dL — ABNORMAL HIGH (ref 70–99)
Glucose-Capillary: 141 mg/dL — ABNORMAL HIGH (ref 70–99)
Glucose-Capillary: 90 mg/dL (ref 70–99)

## 2020-05-31 LAB — CBC
HCT: 23.8 % — ABNORMAL LOW (ref 36.0–46.0)
Hemoglobin: 7.8 g/dL — ABNORMAL LOW (ref 12.0–15.0)
MCH: 32 pg (ref 26.0–34.0)
MCHC: 32.8 g/dL (ref 30.0–36.0)
MCV: 97.5 fL (ref 80.0–100.0)
Platelets: 209 10*3/uL (ref 150–400)
RBC: 2.44 MIL/uL — ABNORMAL LOW (ref 3.87–5.11)
RDW: 14.3 % (ref 11.5–15.5)
WBC: 26 10*3/uL — ABNORMAL HIGH (ref 4.0–10.5)
nRBC: 0.3 % — ABNORMAL HIGH (ref 0.0–0.2)

## 2020-05-31 LAB — DIFFERENTIAL
Abs Immature Granulocytes: 0.85 10*3/uL — ABNORMAL HIGH (ref 0.00–0.07)
Basophils Absolute: 0.1 10*3/uL (ref 0.0–0.1)
Basophils Relative: 0 %
Eosinophils Absolute: 0 10*3/uL (ref 0.0–0.5)
Eosinophils Relative: 0 %
Immature Granulocytes: 3 %
Lymphocytes Relative: 7 %
Lymphs Abs: 1.9 10*3/uL (ref 0.7–4.0)
Monocytes Absolute: 1.9 10*3/uL — ABNORMAL HIGH (ref 0.1–1.0)
Monocytes Relative: 7 %
Neutro Abs: 21.4 10*3/uL — ABNORMAL HIGH (ref 1.7–7.7)
Neutrophils Relative %: 83 %

## 2020-05-31 LAB — PHOSPHORUS: Phosphorus: 2.9 mg/dL (ref 2.5–4.6)

## 2020-05-31 LAB — PREALBUMIN: Prealbumin: 6.1 mg/dL — ABNORMAL LOW (ref 18–38)

## 2020-05-31 LAB — BASIC METABOLIC PANEL

## 2020-05-31 LAB — MAGNESIUM: Magnesium: 2.7 mg/dL — ABNORMAL HIGH (ref 1.7–2.4)

## 2020-05-31 LAB — TRIGLYCERIDES: Triglycerides: 194 mg/dL — ABNORMAL HIGH (ref <150)

## 2020-05-31 MED ORDER — TRAVASOL 10 % IV SOLN
INTRAVENOUS | Status: AC
  Filled 2020-05-31: qty 990

## 2020-05-31 MED ORDER — LORAZEPAM 2 MG/ML IJ SOLN
0.5000 mg | Freq: Two times a day (BID) | INTRAMUSCULAR | Status: DC
  Administered 2020-05-31 (×2): 0.5 mg via INTRAVENOUS
  Filled 2020-05-31 (×2): qty 1

## 2020-05-31 MED ORDER — CEFAZOLIN SODIUM-DEXTROSE 2-4 GM/100ML-% IV SOLN
2.0000 g | Freq: Two times a day (BID) | INTRAVENOUS | Status: DC
  Administered 2020-05-31: 22:00:00 2 g via INTRAVENOUS
  Filled 2020-05-31 (×2): qty 100

## 2020-05-31 MED ORDER — ACETAMINOPHEN 650 MG RE SUPP: 650.0000 mg | Freq: Four times a day (QID) | RECTAL | Status: DC | PRN

## 2020-05-31 MED ORDER — DILTIAZEM HCL-DEXTROSE 125-5 MG/125ML-% IV SOLN (PREMIX)
5.0000 mg/h | INTRAVENOUS | Status: DC
  Administered 2020-06-01: 5 mg/h via INTRAVENOUS
  Filled 2020-05-31 (×2): qty 125

## 2020-05-31 MED ORDER — HALOPERIDOL LACTATE 5 MG/ML IJ SOLN
2.5000 mg | Freq: Four times a day (QID) | INTRAMUSCULAR | Status: DC | PRN
  Administered 2020-05-31 – 2020-06-04 (×3): 2.5 mg via INTRAVENOUS
  Filled 2020-05-31 (×3): qty 1

## 2020-05-31 MED ORDER — ALBUMIN HUMAN 25 % IV SOLN
12.5000 g | Freq: Four times a day (QID) | INTRAVENOUS | Status: AC
  Administered 2020-05-31 – 2020-06-01 (×3): 12.5 g via INTRAVENOUS
  Filled 2020-05-31 (×3): qty 50

## 2020-05-31 MED ORDER — MIDAZOLAM HCL 2 MG/2ML IJ SOLN
0.5000 mg | Freq: Once | INTRAMUSCULAR | Status: AC
  Administered 2020-05-31: 02:00:00 0.5 mg via INTRAVENOUS
  Filled 2020-05-31: qty 2

## 2020-05-31 MED FILL — Sodium Chloride IV Soln 0.9%: INTRAVENOUS | Qty: 250 | Status: AC

## 2020-05-31 MED FILL — Phenylephrine HCl IV Soln 10 MG/ML: INTRAVENOUS | Qty: 10 | Status: AC

## 2020-05-31 NOTE — Progress Notes (Signed)
Nutrition Follow-up  DOCUMENTATION CODES:   Non-severe (moderate) malnutrition in context of chronic illness  INTERVENTION:  - continue TPN/TPN advancement. - Monitor magnesium, potassium, and phosphorus daily for at least 3 days, MD to replete as needed, as pt is at risk for refeeding syndrome given moderate malnutrition.  NUTRITION DIAGNOSIS:   Moderate Malnutrition related to chronic illness as evidenced by mild fat depletion,mild muscle depletion,moderate muscle depletion. -ongoing  GOAL:   Patient will meet greater than or equal to 90% of their needs -to be met with TPN regimen  MONITOR:   Diet advancement,Labs,Weight trends,Skin,Other (Comment) (TPN regimen)  ASSESSMENT:   86 y.o. female with medical history of osteoporosis and hypothyroidism. She had acute abdominal pain which started around 0400 on 12/5 and she had one episode of emesis. Pain was constant and worsening so she presented to the ED later in the day on 12/5. Patient reported recent constipation. She has surgical hx of hysterectomy, appendectomy, and bladder repair. Patient lives independently.  Significant Events: 12/5- admission; ex lap with SBR 12/6- re-intubated; initial RD assessment; OGT malpositioned and unable to be replaced 12/8- extubated; re-intubated; NGT placed in R nare 12/9- NGT removed; triple lumen PICC placed in L brachial; TPN initiation; NGT placed in R nare 12/12- extubated; NGT removed by patient   Patient is now on nasal cannula with no NGT in place, no plans to replace at this time. She is receiving custom TPN at 55 ml/hr to increase to 75 ml/hr tonight at 1800.  Able to communicate with Pharmacist this AM about updated estimated nutrition needs. Weight is -2 lb compared to admission weight.   Notes state that CXR 12/12 showed worsening airspace disease.     Labs reviewed; CBG: 141 mg/dl, BUN: 57 mg/dl, creatinine: 1.28 mg/dl, Mg: 2.7 mg/dl, GFR: 41 ml/min.  Medications reviewed;  sliding scale novolog, 44 mcg IV synthroid/day, 2 g IV Mg sulfate x1 run 12/12, 10 mEq IV KCl x3 runs 12/12 and x2 runs 12/13.   Diet Order:   Diet Order            Diet NPO time specified  Diet effective now                 EDUCATION NEEDS:   Not appropriate for education at this time  Skin:  Skin Assessment: Skin Integrity Issues: Skin Integrity Issues:: Stage I,Incisions Stage I: mid lower back (new documentation 12/8) Incisions: abdomen (12/5)  Last BM:  PTA/unknown  Height:   Ht Readings from Last 1 Encounters:  05/25/20 _0  (1.575 m)    Weight:   Wt Readings from Last 1 Encounters:  05/27/20 49.9 kg    Estimated Nutritional Needs:  Kcal:  1645-1795 kcal Protein:  75-90 grams Fluid:  >/= 1.7 L/day     Jarome Matin, MS, RD, LDN, CNSC Inpatient Clinical Dietitian RD pager # available in [place]  After hours/weekend pager # available in [facility]

## 2020-05-31 NOTE — Progress Notes (Signed)
NAME:  Lisa Murray, MRN:  [id number], DOB:  Oct 05, 1930, LOS: 8 ADMISSION DATE:  2020/06/18, CONSULTATION DATE:  06/18/20 REFERRING MD:  Romie Levee, CHIEF COMPLAINT:  Stridor  Brief History   86 year old F with a history of hypothyroidism, osteoporosis, admitted 12/5 with acute abdominal pain, n/v in the setting of a closed loop small bowel obstruction. She underwent an ex-lap with small bowel resection, primary anastomosis & lysis of adhesions.  Course complicated by stridor post extubation requiring reintubation, hypotension on vasopressors, anemia and difficulty with placement of feeding tube in the setting of hiatal hernia.     Past Medical History  Hx of Fall, requiring hospitalization and rehab Hypothyroidism Osteoporosis HTN S/p ab hysterectomy, bladder repair Appendectomy  Lives independently at baseline, independent of all ADL's prior to current admit (12/5)  Significant Hospital Events   12/05 Admit with abd pain, s/p ex-lab with small bowel resection & lysis of adhesions 12/08 Extubated, failed / hypoxic, reintubated with observed thick secretions at cords 12/09 Episodes of bradycardia on precedex + neo. Levophed changed to neo in setting of AFwRVR 12/13 Extubated  Consults:  PCCM  Procedures:  ETT 12/5 >12/8, 12/8 > 12/12 L Radial ALine 12/5 > 12/8  Left Brachial 12/09 >>   Significant Diagnostic Tests:  CT abd/pelv 12/5 > features of high-grade small bowel obstruction a closed loop in the right lower quadrant demonstrating dilatation, thickening and adjacent inflammation and mural hypoattenuation concerning for vascular compromise. Possibly attributable to a post surgical adhesion given history of hysterectomy and appendectomy. Delayed right renal nephrogram with moderate hydroureteronephrosis with the distal ureter deviated towards the right lower quadrant and possibly involved by the obstructive process in the right lower quadrant. Mild mural thickening of the  cecum though this is possibly reactive. Large hiatal hernia with a degree of likely organoaxial twisting with the pylorus remaining below the level of the diaphragm. Distended appearance of the proximal stomach could reflect some partial obstruction. Unchanged compression deformities and vertebral body height loss involving the L1, L2 and L4 levels. Aortic Atherosclerosis. ECHO 12/10 >> mod to severe MR, severe TR, midlly elevated PASP. LVEF 65-70% with G1DD   Micro Data:  COVID 12/5 > negative  Influenza 12/5 > negative  UC 12/5 > less than 10k colonies, insignificant growth Tracheal aspirate 12/8 > few GNR, abundant staph aureus & few K. Pneumoniae> K pneumonia R ampicillin, otherwise pan-sensitive, staph aureus sensitivities still pending  Antimicrobials:  Vanco 12/8 > 12/11 Cefepime 12/8 > 12/11 Cefazolin 12/11  Interim history/subjective:  Increased agitation overnight. Titration of vasopressors now on 50 mcg/min NEO.   Objective   Blood pressure (!) 82/60, pulse (!) 137, temperature (!) 96.5 F (35.8 C), temperature source Axillary, resp. rate (!) 39, height 5\' 2"  (1.575 m), weight 49.9 kg, SpO2 100 %. CVP:  [1 mmHg-2 mmHg] 2 mmHg  Vent Mode: BIPAP FiO2 (%):  [50 %-60 %] 60 % Set Rate:  [15 bmp] 15 bmp PEEP:  [5 cmH20] 5 cmH20   Intake/Output Summary (Last 24 hours) at 05/31/2020 1218 Last data filed at 05/31/2020 1100 Gross per 24 hour  Intake 2952.45 ml  Output 3310 ml  Net -357.55 ml  net for admission  Filed Weights   18-Jun-2020 1707 05/24/20 0500 05/27/20 0838  Weight: 50.8 kg 50.5 kg 49.9 kg    Examination: General: elderly female, thrashing in bed  HEENT: BiPAP Neuro: confused, alert, spontaneously moves all extremities, pupils intact and reactive   CV: Tachy, Irregular, no  MRG  PULM: coarse breath sounds, slight tachypnea GI: soft, NT, ND. Incision without erythema. Extremities: warm, dry, no edema Skin: multiple bruising noted throughout   Resolved  Hospital Problem list   Lactic Acidosis secondary to Bowel Ischemia  Post-extubation stridor requiring reintubation post-op  Assessment & Plan:   Encephalopathy, Agitation. Multifactorial. In setting of Shock,  Concern for Benzo withdrawal, patient takes Xanax BID at home Plan  -Precedex for RASS goal 0/-1 -Add Scheduled Ativan BID  -PRN Haldol. Monitor QTC.   Acute respiratory failure due to upper airway obstruction Acute pulmonary edema  Staph aureus pneumonia Post-extubation stridor- resolved Failed extubation post-op, reintubated for stridor post-op which may have been related to contrast pre-op for imaging, treated with steroids.  Failed again 12/8 with hypoxia, poor effort. Extubated 12/12 Plan -continue ancef to complete 7 days of appropriate antibiotics -Continue BiPAP. Attempted to wean off this AM with oxygen saturation 70s on 6L and increased WOB -Ongoing discussion with family regarding GOC  SBO, s/p Ex-Lap and partial resection Large hiatal hernia in left hemithorax Moderate malnutrition  Plan -Per Surgery  -con't to hold enteral meds and nutrition until bowel function returns -Continue TPN  Shock due to sedation Plan  Suspect multifactorial in the setting of sedation for mechanical ventilation, AFwithRVR. Cortisol 19. Plan    -con't neosynephrine; had more Afib on norepi. Quad strength neosynephrine.  -titrate pressors to maintain MAP>65. Wean as able.  Paroxysmal A- fib w RVR Plan  -Cardiac Monitoring  -Metoprolol PRN, Can not receive Amiodarone due to allergy   Hypokalemia - resolved Hypophosphatemia - resolved, now hyperphosphatemia Refeeding syndrome Plan -con't to monitor and replete aggressively -goal K >4, Mg >2 with AFib  Hypothyroidism Plan -continue IV synthroid until able to take PO's   Hypergylcemia -controlled Plan -not requiring insulin -con't accuchecks Q4h -goal BG 140-180 if requiring insulin while hospitalized  Acute  anemia Suspect multifactorial in setting of operative blood loss and dilutional component  Plan -Trend CBC  -transfuse for Hgb <7% or hemodynamically significant bleeding  Moderate Protein Calorie Malnutrition Refeeding Syndrome Plan -TPN per pharmacy -aggressive electrolyte repletion   Best practice (evaluated daily)  Diet: NPO DVT prophylaxis: lovenox  GI prophylaxis: protonix Glucose control: SSI Mobility: progressive Last date of multidisciplinary goals of care discussion: per primary team. Spoke to [place], nephew and POA. Decision made for no CPR/ACLS. Currently okay with intubation, >> still ongoing discussion regarding this Family and staff present: Summary of discussion: Follow up goals of care discussion due:  Code Status: Full Code  Disposition: ICU   Labs   CBC: Recent Labs  Lab 05/26/20 0450 05/26/20 1644 05/26/20 1954 05/27/20 0319 05/28/20 0257 05/29/20 0416 05/31/20 0610  WBC 8.9  --   --  15.9* 14.3* 15.8* 26.0*  NEUTROABS 7.7  --   --   --  11.6*  --  21.4*  HGB 7.0*   < > 9.5* 8.4* 7.9* 7.4* 7.8*  HCT 21.7*   < > 29.9* 26.1* 23.7* 23.0* 23.8*  MCV 100.5*  --   --  97.0 96.3 99.1 97.5  PLT 112*  --   --  152 165 179 209   < > = values in this interval not displayed.    Basic Metabolic Panel: Recent Labs  Lab 05/28/20 1700 05/29/20 0416 05/29/20 2253 05/30/20 0443 05/30/20 2055 05/30/20 2223 05/31/20 0610  NA 140 141 140 137 QUESTIONABLE RESULTS, RECOMMEND RECOLLECT TO VERIFY 141 140  K 4.1 2.8* 4.6 4.5 QUESTIONABLE RESULTS, RECOMMEND  RECOLLECT TO VERIFY 3.9 4.0  CL 106 107 101 97* QUESTIONABLE RESULTS, RECOMMEND RECOLLECT TO VERIFY 97* 100  CO2 25 24 26 26  QUESTIONABLE RESULTS, RECOMMEND RECOLLECT TO VERIFY 28 27  GLUCOSE 107* 184* 114* 119* QUESTIONABLE RESULTS, RECOMMEND RECOLLECT TO VERIFY 151* 157*  BUN 19 22 30* 34* QUESTIONABLE RESULTS, RECOMMEND RECOLLECT TO VERIFY 47* 57*  CREATININE 0.74 0.74 1.01* 1.04* QUESTIONABLE RESULTS,  RECOMMEND RECOLLECT TO VERIFY 1.15* 1.28*  CALCIUM 8.5* 7.6* 8.7* 9.2 QUESTIONABLE RESULTS, RECOMMEND RECOLLECT TO VERIFY 9.1 9.2  MG 1.7 1.5* 3.4* 3.1*  --   --  2.7*  PHOS 2.3* 2.4* 4.9* 5.3*  --   --  2.9   GFR: Estimated Creatinine Clearance: 24.4 mL/min (A) (by C-G formula based on SCr of 1.28 mg/dL (H)). Recent Labs  Lab 05/27/20 0319 05/28/20 0257 05/29/20 0416 05/31/20 0610  WBC 15.9* 14.3* 15.8* 26.0*    Liver Function Tests: Recent Labs  Lab 05/26/20 1957 05/27/20 0319 05/28/20 0257 05/31/20 0610  AST 81* 53* 134* 96*  ALT 32 29 105* 29  ALKPHOS 64 56 93 106  BILITOT 2.9* 2.2* 1.8* 0.9  PROT 6.4* 5.7* 5.6* 6.7  ALBUMIN 3.4* 2.9* 2.6* 2.7*   No results for input(s): LIPASE, AMYLASE in the last 168 hours. No results for input(s): AMMONIA in the last 168 hours.  ABG    Component Value Date/Time   PHART 7.530 (H) 05/30/2020 2116   PCO2ART 33.8 05/30/2020 2116   PO2ART 60.8 (L) 05/30/2020 2116   HCO3 28.1 (H) 05/30/2020 2116   TCO2 23 06/05/2020 2151   ACIDBASEDEF 2.0 05/26/2020 1954   O2SAT 91.8 05/30/2020 2116     Coagulation Profile: Recent Labs  Lab 05/25/20 0841  INR 1.6*    Cardiac Enzymes: No results for input(s): CKTOTAL, CKMB, CKMBINDEX, TROPONINI in the last 168 hours.  HbA1C: No results found for: HGBA1C  CBG: Recent Labs  Lab 05/30/20 0734 05/30/20 1154 05/30/20 1658 05/30/20 1915 05/31/20 0807  GLUCAP 120* 113* 128* 144* 141*    This patient is critically ill with multiple organ system failure which requires frequent high complexity decision making, assessment, support, evaluation, and titration of therapies. This was completed through the application of advanced monitoring technologies and extensive interpretation of multiple databases. During this encounter critical care time was devoted to patient care services described in this note for 52 minutes.  06/02/20, AGACNP-BC [facility]  PCCM Pgr:  [phone]

## 2020-05-31 NOTE — Plan of Care (Signed)
  Problem: Clinical Measurements: Goal: Ability to maintain clinical measurements within normal limits will improve Outcome: Progressing Goal: Postoperative complications will be avoided or minimized Outcome: Progressing   Problem: Skin Integrity: Goal: Demonstration of wound healing without infection will improve Outcome: Progressing   Problem: Clinical Measurements: Goal: Ability to maintain clinical measurements within normal limits will improve Outcome: Progressing Goal: Postoperative complications will be avoided or minimized Outcome: Progressing   Problem: Skin Integrity: Goal: Demonstration of wound healing without infection will improve Outcome: Progressing

## 2020-05-31 NOTE — Progress Notes (Signed)
eLink Physician-Brief Progress Note Patient Name: Lisa Murray DOB: 07/30/1930 MRN: [id number]   Date of Service  05/31/2020  HPI/Events of Note  Agitation - Sat = 91% on BiPAP.   eICU Interventions  Plan: 1. Versed 0.5 mg IV X 1 now.      Intervention Category Major Interventions: Delirium, psychosis, severe agitation - evaluation and management  [place] Fedora Eugene 05/31/2020, 1:00 AM

## 2020-05-31 NOTE — Progress Notes (Signed)
PHARMACY NOTE:  ANTIMICROBIAL RENAL DOSAGE ADJUSTMENT  Current antimicrobial regimen includes a mismatch between antimicrobial dosage and estimated renal function.  As per policy approved by the Pharmacy & Therapeutics and Medical Executive Committees, the antimicrobial dosage will be adjusted accordingly.  Current antimicrobial dosage:  Cefazolin 2 g iv q 8 hours  Indication: MSSA PNA  Renal Function:  Estimated Creatinine Clearance: 24.4 mL/min (A) (by C-G formula based on SCr of 1.28 mg/dL (H)). []      On intermittent HD, scheduled: []      On CRRT    Antimicrobial dosage has been changed to:  Cefazolin 2 g iv q 12 hours  Additional comments:   Thank you for allowing pharmacy to be a part of this patient's care.  , [facility] 05/31/2020 8:38 AM

## 2020-05-31 NOTE — Plan of Care (Signed)
  Problem: Coping: Goal: Level of anxiety will decrease Outcome: Progressing   Problem: Elimination: Goal: Will not experience complications related to bowel motility Outcome: Progressing Goal: Will not experience complications related to urinary retention Outcome: Progressing   Problem: Safety: Goal: Non-violent Restraint(s) Outcome: Not Applicable

## 2020-05-31 NOTE — Progress Notes (Signed)
Anesthesiology Follow-up:  86 year old female originally admitted on June 01, 2020 with small bowel obstruction requiring emergency exploratory lap and resection of 12 inches of strangulated small bowel with primary anastomosis. Patient was extubated in the OR but subsequently required reintubation 7 hours later for stridor. She was extubated on 12/8 and was again re-intubated for agitation and respiratory failure with afib.  She was subsequently extubated again yesterday. Now on BiPAP 15/5 FiO2 0.5 patient confused and delirious has been treated with haldol and ativan. On TPN after she pulled out her NG tube. Now in SR on low dose neosynephrine for hemodynamic support.   VS: T- 35.8 BP- 101/54 HR- 67 (SR) RR- 24 O2 sat 100%   K-4.0 BUN/Cr- 57/1.28 H/H- 7.8/23.8 WBC- 26,000  CXR- L. Middle and lower lobe airspace disease concerning for pneumonia  86 year old female 8 days s/p exploratory lap and small bowel obstruction. Difficult post-op course complicated by failed intubation X 2, delrium and probable pneumonia. Prognosis guarded. Appreciate care from CCM.  Kipp Brood

## 2020-05-31 NOTE — TOC Progression Note (Addendum)
Transition of Care [facility]) - Progression Note    Patient Details  Name: Lisa Murray MRN: [id number] Date of Birth: 1931/05/02  Transition of Care [facility]) CM/SW Contact  Golda Acre, RN Phone Number: 05/31/2020, 9:43 AM  Clinical Narrative:    Patient extubated yesterday.  On BiPap.  Increasing pressors overnight, but are starting to wean some this am.  In restraints.  Pulled her NGT out yesterday despite 4 point restraints and a sitter.  In a fib. CXR yesterday showed worsening airspace disease Plan is to send to home may possibly need snf placement. Following for progression  Expected Discharge Plan: Home/Self Care Barriers to Discharge: No Barriers Identified  Expected Discharge Plan and Services Expected Discharge Plan: Home/Self Care   Discharge Planning Services: CM Consult   Living arrangements for the past 2 months: Single Family Home                                       Social Determinants of Health (SDOH) Interventions    Readmission Risk Interventions No flowsheet data found.

## 2020-05-31 NOTE — Progress Notes (Signed)
8 Days Post-Op  Subjective: Patient extubated yesterday.  On BiPap.  Increasing pressors overnight, but are starting to wean some this am.  In restraints.  Pulled her NGT out yesterday despite 4 point restraints and a sitter.  In a fib. CXR yesterday showed worsening airspace disease  ROS: See above, otherwise other systems negative  Objective: Vital signs in last 24 hours: Temp:  [96.6 F (35.9 C)-98.2 F (36.8 C)] 96.6 F (35.9 C) (12/13 0735) Pulse Rate:  [55-149] 77 (12/13 0800) Resp:  [17-40] 23 (12/13 0800) BP: (81-173)/(40-109) 105/50 (12/13 0830) SpO2:  [85 %-100 %] 100 % (12/13 0800) FiO2 (%):  [50 %] 50 % (12/13 0759) Last BM Date:  (PTA)  Intake/Output from previous day: 12/12 0701 - 12/13 0700 In: 2729.6 [I.V.:2227.4; IV Piggyback:502.2] Out: 3250 [Urine:3250] Intake/Output this shift: Total I/O In: 379.6 [I.V.:108.8; IV Piggyback:270.9] Out: 60 [Urine:60]  PE: Gen: elderly ill female on Bipap Heart: irregularly irregular Lungs: on Bipap, diminished BS Abd: soft, appropriately tender, midline incision c/d/i Ext: mittens in place  Lab Results:  Recent Labs    05/29/20 0416 05/31/20 0610  WBC 15.8* 26.0*  HGB 7.4* 7.8*  HCT 23.0* 23.8*  PLT 179 209   BMET Recent Labs    05/30/20 2223 05/31/20 0610  NA 141 140  K 3.9 4.0  CL 97* 100  CO2 28 27  GLUCOSE 151* 157*  BUN 47* 57*  CREATININE 1.15* 1.28*  CALCIUM 9.1 9.2   PT/INR No results for input(s): LABPROT, INR in the last 72 hours. CMP     Component Value Date/Time   NA 140 05/31/2020 0610   NA 141 03/01/2020 0000   K 4.0 05/31/2020 0610   CL 100 05/31/2020 0610   CO2 27 05/31/2020 0610   GLUCOSE 157 (H) 05/31/2020 0610   BUN 57 (H) 05/31/2020 0610   BUN 10 03/01/2020 0000   CREATININE 1.28 (H) 05/31/2020 0610   CALCIUM 9.2 05/31/2020 0610   PROT 6.7 05/31/2020 0610   ALBUMIN 2.7 (L) 05/31/2020 0610   AST 96 (H) 05/31/2020 0610   ALT 29 05/31/2020 0610   ALKPHOS 106  05/31/2020 0610   BILITOT 0.9 05/31/2020 0610   GFRNONAA 41 (L) 05/31/2020 0610   GFRAA  05/30/2020 2055    QUESTIONABLE RESULTS, RECOMMEND RECOLLECT TO VERIFY   Lipase     Component Value Date/Time   LIPASE 21 06/14/2020 1721       Studies/Results: DG CHEST PORT 1 VIEW  Result Date: 05/30/2020 CLINICAL DATA:  Hypoxia EXAM: PORTABLE CHEST 1 VIEW COMPARISON:  05/29/2020 FINDINGS: Worsening airspace disease in the left mid and lower lung. Possible layering left effusion. Right lung clear. Heart is normal size. IMPRESSION: Worsening left mid and lower lung consolidation concerning for pneumonia. Possible layering left effusion. Electronically Signed   By: Charlett Nose M.D.   On: 05/30/2020 21:27    Anti-infectives: Anti-infectives (From admission, onward)   Start     Dose/Rate Route Frequency Ordered Stop   05/31/20 2200  ceFAZolin (ANCEF) IVPB 2g/100 mL premix        2 g 200 mL/hr over 30 Minutes Intravenous Every 12 hours 05/31/20 0837     05/29/20 1800  ceFAZolin (ANCEF) IVPB 2g/100 mL premix  Status:  Discontinued        2 g 200 mL/hr over 30 Minutes Intravenous Every 12 hours 05/29/20 0951 05/29/20 0954   05/29/20 1800  ceFAZolin (ANCEF) IVPB 2g/100 mL premix  Status:  Discontinued  2 g 200 mL/hr over 30 Minutes Intravenous Every 12 hours 05/29/20 0954 05/29/20 1512   05/29/20 1600  ceFAZolin (ANCEF) IVPB 2g/100 mL premix  Status:  Discontinued        2 g 200 mL/hr over 30 Minutes Intravenous Every 8 hours 05/29/20 1512 05/31/20 0837   05/27/20 2000  vancomycin (VANCOREADY) IVPB 500 mg/100 mL  Status:  Discontinued        500 mg 100 mL/hr over 60 Minutes Intravenous Every 24 hours 05/26/20 1800 05/29/20 0951   05/26/20 2000  vancomycin (VANCOCIN) IVPB 1000 mg/200 mL premix        1,000 mg 200 mL/hr over 60 Minutes Intravenous  Once 05/26/20 1752 05/26/20 2245   05/26/20 1830  ceFEPIme (MAXIPIME) 2 g in sodium chloride 0.9 % 100 mL IVPB  Status:  Discontinued         2 g 200 mL/hr over 30 Minutes Intravenous Every 12 hours 05/26/20 1750 05/29/20 0944   05/19/2020 2100  cefoTEtan (CEFOTAN) 1 g in sodium chloride 0.9 % 100 mL IVPB        1 g 200 mL/hr over 30 Minutes Intravenous On call to O.R. 05/24/2020 2008 06/14/2020 2112       Assessment/Plan POD 8, s/p ex lap with SBR secondary to adhesive band with closed loop SBO, Dr. Maisie Fus on 05/19/2020 -NGT pulled out yesterday by patient.  Continue NPO at this time given tenuous respiratory status.  Leave NGT out for right now and see how she does.  Lactic acidosis 2/2 ischemic bowel - Cleared on follow up lactic acid level 12/6  Acute blood loss anemia combined with dilutional anemia - HGBstable - monitor hgb and transfuse only if needed  Non-severe moderate degree of malnutrition, present on admission -Continue TPN till tolerating nutritional requirements PO  Post-extubation stridor requiring reintubation Possible flash pulmonary edema and or pneumonia Atrial fibrillation with rapid ventricular response - Appreciate critical care team's assistance  - Failed extubation 05/26/2020 - extubated 12/12, but on Bipap and very tenuous - Antibiotics per critical care  Hypothyroidism - home medications  Hyperglycemia due to acute illness - SSI  FEN: IVF, TNA ID: [email or web address]/[email or web address] per critical care VTE: SCDs, 30 lovenox Foley: Continue Dispo: Continued ICU care   LOS: 8 days    Letha Cape , [facility] Surgery 05/31/2020, 9:23 AM Please see Amion for pager number during day hours 7:00am-4:30pm or 7:00am -11:30am on weekends

## 2020-05-31 NOTE — Progress Notes (Signed)
eLink Physician-Brief Progress Note Patient Name: Lisa Murray DOB: 1931/03/23 MRN: [id number]   Date of Service  05/31/2020  HPI/Events of Note  AFIB with RVR - Ventricular rate = 125-135. BP = 135/87. Patient is allergic to Iodine (Amiodarone). Last K+ = 4.0 and Mg++ = 2.7.  eICU Interventions  Plan: 1.Cardizem IV infusion. Titrate to HR = 65 to 105.      Intervention Category Major Interventions: Arrhythmia - evaluation and management  Shantai Tiedeman Eugene 05/31/2020, 11:50 PM

## 2020-05-31 NOTE — Addendum Note (Signed)
Addendum  created 05/31/20 1543 by Kipp Brood, MD   Clinical Note Signed

## 2020-05-31 NOTE — Progress Notes (Signed)
PHARMACY - TOTAL PARENTERAL NUTRITION CONSULT NOTE   Indication: Prolonged ileus s/p bowel resection  Patient Measurements: Height: 5\' 2"  (157.5 cm) Weight: 49.9 kg (110 lb 0.2 oz) IBW/kg (Calculated) : 50.1 TPN AdjBW (KG): 50.5 Body mass index is 20.12 kg/m.  Assessment: [name] s/p small bowel resection. Unable to place NGT in IR due to large hiatal hernia. Pharmacy consulted to initiate TPN for prolonged malnutrition/ileus.   Glucose / Insulin: Goal 150-180 on sSSI q 4 h (8 units/ 24 hrs).  CBGs 113-157 Electrolytes: refeeding resolving to resolved, lytes wnl except Mg slightly leevated Renal: SCr increased (0.66 > 1.04>>1.28) on high-dose Lasix LFTs / TGs: AST/ALT mildly elevated but improved to 96/29, Tbili wnl.  TG 194, propofol d/c 12/12. Prealbumin / albumin: in process/2.7  Intake / Output; MIVF: UOP 3250 ml/24 h on Lasix 60 mg iv q 8 hours. No MIVF GI Imaging: 12/5 CT: closed loop SBO, large hiatal hernia Surgeries / Procedures:  12/5 ex-lap, small bowel resection 12/9 NG tube replaced (currently in hiatal hernia).  LIWS till return of bowel function.  Central access:  Triple lumen PICC 12/9 TPN start date: 12/9  Nutritional Goals (per RD recommendation on 12/9) Kcal:  1034 kcal; Protein:  95-105 grams; Fluid:  >/= 1.7 L/day Goal TPN rate is 75 mL/hr (provides 99 g of protein and 1040 kcals per day)  Current Nutrition:  NPO  Plan:  At 1800: Advance TPN to 75 mL/hr  Electrolytes in TPN:  - Continue 5mEq/L of Na  - Continue with decreased to 39mEq/L of K (keeps approximately 40 mEq in TPN), 2.60mEq/L of Ca - Continue without Mg but will add phos back at 5 mmol/L  - Continue to Cl:Ac ratio 1:1 Add standard MVI and trace elements to TPN Continue Sensitive q4h SSI and adjust as needed  No MIVF Monitor TPN labs on Mon/Thurs BMET, Mg, and Phos in AM   4m D  05/31/2020 7:08 AM

## 2020-06-01 ENCOUNTER — Inpatient Hospital Stay (HOSPITAL_COMMUNITY): Payer: PPO

## 2020-06-01 ENCOUNTER — Encounter (HOSPITAL_COMMUNITY): Payer: Self-pay | Admitting: General Surgery

## 2020-06-01 DIAGNOSIS — Z7189 Other specified counseling: Secondary | ICD-10-CM

## 2020-06-01 DIAGNOSIS — Z515 Encounter for palliative care: Secondary | ICD-10-CM

## 2020-06-01 LAB — PHOSPHORUS
Phosphorus: 2 mg/dL — ABNORMAL LOW (ref 2.5–4.6)
Phosphorus: 3 mg/dL (ref 2.5–4.6)

## 2020-06-01 LAB — CBC WITH DIFFERENTIAL/PLATELET
Abs Immature Granulocytes: 0.52 10*3/uL — ABNORMAL HIGH (ref 0.00–0.07)
Abs Immature Granulocytes: 1.06 10*3/uL — ABNORMAL HIGH (ref 0.00–0.07)
Basophils Absolute: 0.1 10*3/uL (ref 0.0–0.1)
Basophils Absolute: 0.1 10*3/uL (ref 0.0–0.1)
Basophils Relative: 0 %
Basophils Relative: 0 %
Eosinophils Absolute: 0 10*3/uL (ref 0.0–0.5)
Eosinophils Absolute: 0 10*3/uL (ref 0.0–0.5)
Eosinophils Relative: 0 %
Eosinophils Relative: 0 %
HCT: 26.4 % — ABNORMAL LOW (ref 36.0–46.0)
HCT: 29 % — ABNORMAL LOW (ref 36.0–46.0)
Hemoglobin: 8.5 g/dL — ABNORMAL LOW (ref 12.0–15.0)
Hemoglobin: 9.3 g/dL — ABNORMAL LOW (ref 12.0–15.0)
Immature Granulocytes: 2 %
Immature Granulocytes: 3 %
Lymphocytes Relative: 5 %
Lymphocytes Relative: 4 %
Lymphs Abs: 1.2 10*3/uL (ref 0.7–4.0)
Lymphs Abs: 1.4 10*3/uL (ref 0.7–4.0)
MCH: 32.4 pg (ref 26.0–34.0)
MCH: 31.3 pg (ref 26.0–34.0)
MCHC: 32.2 g/dL (ref 30.0–36.0)
MCHC: 32.1 g/dL (ref 30.0–36.0)
MCV: 100.8 fL — ABNORMAL HIGH (ref 80.0–100.0)
MCV: 97.6 fL (ref 80.0–100.0)
Monocytes Absolute: 1.8 10*3/uL — ABNORMAL HIGH (ref 0.1–1.0)
Monocytes Absolute: 2.1 10*3/uL — ABNORMAL HIGH (ref 0.1–1.0)
Monocytes Relative: 7 %
Monocytes Relative: 7 %
Neutro Abs: 23.8 10*3/uL — ABNORMAL HIGH (ref 1.7–7.7)
Neutro Abs: 26.3 10*3/uL — ABNORMAL HIGH (ref 1.7–7.7)
Neutrophils Relative %: 86 %
Neutrophils Relative %: 86 %
Platelets: 197 10*3/uL (ref 150–400)
Platelets: 216 10*3/uL (ref 150–400)
RBC: 2.62 MIL/uL — ABNORMAL LOW (ref 3.87–5.11)
RBC: 2.97 MIL/uL — ABNORMAL LOW (ref 3.87–5.11)
RDW: 15.9 % — ABNORMAL HIGH (ref 11.5–15.5)
RDW: 15.5 % (ref 11.5–15.5)
WBC: 27.4 10*3/uL — ABNORMAL HIGH (ref 4.0–10.5)
WBC: 30.9 10*3/uL — ABNORMAL HIGH (ref 4.0–10.5)
nRBC: 0.1 % (ref 0.0–0.2)
nRBC: 0.1 % (ref 0.0–0.2)

## 2020-06-01 LAB — CBC
HCT: 20.9 % — ABNORMAL LOW (ref 36.0–46.0)
Hemoglobin: 6.8 g/dL — CL (ref 12.0–15.0)
MCH: 32.2 pg (ref 26.0–34.0)
MCHC: 32.5 g/dL (ref 30.0–36.0)
MCV: 99.1 fL (ref 80.0–100.0)
Platelets: 210 10*3/uL (ref 150–400)
RBC: 2.11 MIL/uL — ABNORMAL LOW (ref 3.87–5.11)
RDW: 14.6 % (ref 11.5–15.5)
WBC: 28.4 10*3/uL — ABNORMAL HIGH (ref 4.0–10.5)
nRBC: 0.1 % (ref 0.0–0.2)

## 2020-06-01 LAB — BASIC METABOLIC PANEL
Anion gap: 12 (ref 5–15)
Anion gap: 11 (ref 5–15)
BUN: 58 mg/dL — ABNORMAL HIGH (ref 8–23)
BUN: 54 mg/dL — ABNORMAL HIGH (ref 8–23)
CO2: 25 mmol/L (ref 22–32)
CO2: 26 mmol/L (ref 22–32)
Calcium: 9.8 mg/dL (ref 8.9–10.3)
Calcium: 9.8 mg/dL (ref 8.9–10.3)
Chloride: 107 mmol/L (ref 98–111)
Chloride: 108 mmol/L (ref 98–111)
Creatinine, Ser: 0.95 mg/dL (ref 0.44–1.00)
Creatinine, Ser: 0.87 mg/dL (ref 0.44–1.00)
GFR, Estimated: 58 mL/min — ABNORMAL LOW (ref >60)
GFR, Estimated: >60 mL/min (ref >60)
Glucose, Bld: 134 mg/dL — ABNORMAL HIGH (ref 70–99)
Glucose, Bld: 120 mg/dL — ABNORMAL HIGH (ref 70–99)
Potassium: 3.1 mmol/L — ABNORMAL LOW (ref 3.5–5.1)
Potassium: 3.5 mmol/L (ref 3.5–5.1)
Sodium: 144 mmol/L (ref 135–145)
Sodium: 145 mmol/L (ref 135–145)

## 2020-06-01 LAB — MAGNESIUM: Magnesium: 2.4 mg/dL (ref 1.7–2.4)

## 2020-06-01 LAB — GLUCOSE, CAPILLARY
Glucose-Capillary: 118 mg/dL — ABNORMAL HIGH (ref 70–99)
Glucose-Capillary: 119 mg/dL — ABNORMAL HIGH (ref 70–99)
Glucose-Capillary: 126 mg/dL — ABNORMAL HIGH (ref 70–99)
Glucose-Capillary: 132 mg/dL — ABNORMAL HIGH (ref 70–99)
Glucose-Capillary: 133 mg/dL — ABNORMAL HIGH (ref 70–99)
Glucose-Capillary: 135 mg/dL — ABNORMAL HIGH (ref 70–99)
Glucose-Capillary: 138 mg/dL — ABNORMAL HIGH (ref 70–99)
Glucose-Capillary: 155 mg/dL — ABNORMAL HIGH (ref 70–99)

## 2020-06-01 LAB — PREPARE RBC (CROSSMATCH)

## 2020-06-01 MED ORDER — TRAVASOL 10 % IV SOLN
INTRAVENOUS | Status: DC
  Filled 2020-06-01: qty 900

## 2020-06-01 MED ORDER — SODIUM CHLORIDE 0.9% IV SOLUTION: Freq: Once | INTRAVENOUS | Status: DC

## 2020-06-01 MED ORDER — IOHEXOL 9 MG/ML PO SOLN
ORAL | Status: AC
  Filled 2020-06-01: qty 1000

## 2020-06-01 MED ORDER — SODIUM CHLORIDE 0.9 % IV SOLN
2.0000 g | Freq: Two times a day (BID) | INTRAVENOUS | Status: DC
  Administered 2020-06-01 – 2020-06-02 (×3): 2 g via INTRAVENOUS
  Filled 2020-06-01 (×3): qty 2

## 2020-06-01 MED ORDER — CEFAZOLIN SODIUM-DEXTROSE 2-4 GM/100ML-% IV SOLN
2.0000 g | Freq: Three times a day (TID) | INTRAVENOUS | Status: DC
  Filled 2020-06-01: qty 100

## 2020-06-01 MED ORDER — POTASSIUM PHOSPHATES 15 MMOLE/5ML IV SOLN
30.0000 mmol | Freq: Once | INTRAVENOUS | Status: AC
  Administered 2020-06-01: 05:00:00 30 mmol via INTRAVENOUS
  Filled 2020-06-01: qty 10

## 2020-06-01 MED ORDER — IOHEXOL 9 MG/ML PO SOLN: 500.0000 mL | ORAL | Status: AC

## 2020-06-01 NOTE — Progress Notes (Signed)
PHARMACY - TOTAL PARENTERAL NUTRITION CONSULT NOTE   Indication: Prolonged ileus s/p bowel resection  Patient Measurements: Height: 5\' 2"  (157.5 cm) Weight: 49.9 kg (110 lb 0.2 oz) IBW/kg (Calculated) : 50.1 TPN AdjBW (KG): 50.5 Body mass index is 20.12 kg/m.  Assessment: [name] s/p small bowel resection. Unable to place NGT in IR due to large hiatal hernia. Pharmacy consulted to initiate TPN for prolonged malnutrition/ileus.   Glucose / Insulin: Goal 150-180 on sSSI q 4 h (10 units/ 24 hrs).  CBGs 90-134 Electrolytes: K and phos below goal possible refeeding combined with aggressive diuresis now stopped Renal: SCr improved off lasix LFTs / TGs: AST/ALT mildly elevated but improved to 96/29 (12/13), Tbili wnl (12/13). TG 194 (12/13), propofol d/c 12/12. Prealbumin / albumin: in process/2.7  Intake / Output; MIVF: UOP 1010 mL significantly reduced off Lasix. No MIVF GI Imaging: 12/5 CT: closed loop SBO, large hiatal hernia Surgeries / Procedures:  12/5 ex-lap, small bowel resection 12/9 NG tube replaced (currently in hiatal hernia).  LIWS till return of bowel function.  Central access:  Triple lumen PICC 12/9 TPN start date: 12/9  Nutritional Goals (per RD recommendation on 12/9) Kcal:  1034 kcal; Protein:  95-105 grams; Fluid:  >/= 1.7 L/day Goal TPN rate is 75 mL/hr (provides 99 g of protein and 1040 kcals per day)  Updated RD recs 12/13 now that off extubated: 1645-1795 kcal, 75-90 g protein, >/= 1.7 L/day Goal TPN rate of 75 ml/hr providing 90 g protein and 1638 kcal per day  Current Nutrition:  NPO  Plan:  At 1800: Advance TPN to 75 mL/hr with decreased protein and increased calories per new RD recs Resume standard electrolyte concentrations Electrolytes in TPN: 40mEq/L of Na, 44mEq/L of K, 67mEq/L of Ca, 33mEq/L of Mg, and 75mmol/L of Phos. Cl:Ac ratio 1:1 Add standard MVI and trace elements to TPN Continue Sensitive q4h SSI and adjust as needed  No MIVF Monitor  TPN labs on Mon/Thurs BMET, Mg, and Phos in AM  12m D  06/01/2020 9:36 AM

## 2020-06-01 NOTE — Progress Notes (Addendum)
eLink Physician-Brief Progress Note Patient Name: Lisa Murray DOB: December 14, 1930 MRN: [id number]   Date of Service  06/01/2020  HPI/Events of Note  Patient vomited bilious material. Sat = 90%  eICU Interventions  Plan: 1. Remove BiPAP for now.  2. Place on NRM.  3. Place NGT to LIS. 4. Portable abdominal film post NGT placement.  5. Portable CXR at 5 AM.     Intervention Category Major Interventions: Other:  Shavon Zenz Dennard Nip 06/01/2020, 12:22 AM

## 2020-06-01 NOTE — Progress Notes (Signed)
Pharmacy Antibiotic Note  Lisa Murray is a 86 y.o. female currently on cefazolin for MSSA PNA (Klebsiella thought to be a possible colonizer) now ordered cefepime for possible aspiration PNA.   Plan: Cefepime 2 g iv q 12 hours  Will sign-off and follow remotely for renal adjustment and clinical course  Height: 5\' 2"  (157.5 cm) Weight: 49.9 kg (110 lb 0.2 oz) IBW/kg (Calculated) : 50.1  Temp (24hrs), Avg:98.9 F (37.2 C), Min:97.7 F (36.5 C), Max:99.5 F (37.5 C)  Recent Labs  Lab 05/27/20 0319 05/27/20 2047 05/28/20 0257 05/28/20 1700 05/29/20 0416 05/29/20 2253 05/30/20 0443 05/30/20 2055 05/30/20 2223 05/31/20 0610 06/01/20 0300  WBC 15.9*  --  14.3*  --  15.8*  --   --   --   --  26.0* 28.4*  CREATININE 0.72   < > 0.66   < > 0.74   < > 1.04* QUESTIONABLE RESULTS, RECOMMEND RECOLLECT TO VERIFY 1.15* 1.28* 0.95   < > = values in this interval not displayed.    Estimated Creatinine Clearance: 32.9 mL/min (by C-G formula based on SCr of 0.95 mg/dL).    Allergies  Allergen Reactions  . Iodine Hives  . Codeine Nausea And Vomiting  . Contrast Media [Iodinated Diagnostic Agents]   . Doxycycline Nausea And Vomiting  . Fosamax [Alendronate] Nausea And Vomiting  . Macrobid [Nitrofurantoin] Nausea And Vomiting  . Tramadol Nausea And Vomiting  . Erythromycin Rash  . Morphine And Related Anxiety  . Novocain [Procaine] Anxiety  . Penicillin G Benzathine Rash  . Sulfa Antibiotics Rash    Thank you for allowing pharmacy to be a part of this patient's care.  06/03/20 D 06/01/2020 1:01 PM

## 2020-06-01 NOTE — TOC Progression Note (Signed)
Transition of Care [facility]) - Progression Note    Patient Details  Name: Lisa Murray MRN: [id number] Date of Birth: 04-16-1931  [facility]) CM/SW Contact  Golda Acre, RN Phone Number: 06/01/2020, 7:53 AM  Clinical Narrative:    Pt vomiting taken off bipap and placed on hf nrb at 15l/min, ntg reinserted,Iv precedex due to confusion, iv cardizem -a.fib, Iv p[henylephrine due to low bp, iv tpn.   Expected Discharge Plan: Home/Self Care Barriers to Discharge: No Barriers Identified  Expected Discharge Plan and Services Expected Discharge Plan: Home/Self Care   Discharge Planning Services: CM Consult   Living arrangements for the past 2 months: Single Family Home                                       Social Determinants of Health (SDOH) Interventions    Readmission Risk Interventions No flowsheet data found.

## 2020-06-01 NOTE — Progress Notes (Signed)
eLink Physician-Brief Progress Note Patient Name: Lisa Murray DOB: 1930/12/11 MRN: [id number]   Date of Service  06/01/2020  HPI/Events of Note  Hypokalemia / Hypophosphatemia - K+ = 3.1, PO4--- = 2.0 and Creatinine = 0.95.  eICU Interventions  Plan: 1. Replace K+ and PO4---. 2. Repeat the BMP at phosphorus level at 12 noon.      Intervention Category Major Interventions: Electrolyte abnormality - evaluation and management  Aidynn Krenn Eugene 06/01/2020, 4:12 AM

## 2020-06-01 NOTE — Progress Notes (Signed)
eLink Physician-Brief Progress Note Patient Name: Lisa Murray DOB: 1930-08-16 MRN: [id number]   Date of Service  06/01/2020  HPI/Events of Note  Anemia - Hgb = 6.8.  eICU Interventions  Will transfuse 1 unit PRBC now.     Intervention Category Major Interventions: Other:  Lenell Antu 06/01/2020, 4:36 AM

## 2020-06-01 NOTE — Progress Notes (Signed)
eLink Physician-Brief Progress Note Patient Name: Lisa Murray DOB: Jun 23, 1930 MRN: [id number]   Date of Service  06/01/2020  HPI/Events of Note  Confusion - Nursing request to renew order for safety sitter.   eICU Interventions  Will renew order for safety sitter X 12 hours.      Intervention Category Major Interventions: Delirium, psychosis, severe agitation - evaluation and management  [name] 06/01/2020, 2:04 AM

## 2020-06-01 NOTE — Progress Notes (Signed)
9 Days Post-Op  Subjective: Patient now on low dose scheduled ativan to help with her agitation as she takes xanax at home.  She is more calm today, but difficult to awaken and arouse.  Still on 30 of neo down from 70 overnight.  Vomited overnight.  Put NGT in and then removed it unfortunately.  On NRB right now and off BiPap.  ROS: unable due to AMS  Objective: Vital signs in last 24 hours: Temp:  [96.5 F (35.8 C)-99.1 F (37.3 C)] 98.9 F (37.2 C) (12/14 0515) Pulse Rate:  [64-159] 71 (12/14 0700) Resp:  [20-39] 27 (12/14 0700) BP: (76-151)/(41-97) 101/48 (12/14 0700) SpO2:  [80 %-100 %] 100 % (12/14 0700) FiO2 (%):  [50 %-60 %] 50 % (12/13 2333) Last BM Date: 05/31/20  Intake/Output from previous day: 12/13 0701 - 12/14 0700 In: 2899.3 [I.V.:2247.2; IV Piggyback:652.2] Out: 1010 [Urine:1010] Intake/Output this shift: No intake/output data recorded.  PE: Gen: frail ill-appearing elderly female Abd: soft, did not seems very tender, but not awake enough to really tell, +BS, midline incision is c/d/i with staples present.  Honeycomb dressing removed.    Lab Results:  Recent Labs    05/31/20 0610 06/01/20 0300  WBC 26.0* 28.4*  HGB 7.8* 6.8*  HCT 23.8* 20.9*  PLT 209 210   BMET Recent Labs    05/31/20 0610 06/01/20 0300  NA 140 144  K 4.0 3.1*  CL 100 107  CO2 27 25  GLUCOSE 157* 134*  BUN 57* 58*  CREATININE 1.28* 0.95  CALCIUM 9.2 9.8   PT/INR No results for input(s): LABPROT, INR in the last 72 hours. CMP     Component Value Date/Time   NA 144 06/01/2020 0300   NA 141 03/01/2020 0000   K 3.1 (L) 06/01/2020 0300   CL 107 06/01/2020 0300   CO2 25 06/01/2020 0300   GLUCOSE 134 (H) 06/01/2020 0300   BUN 58 (H) 06/01/2020 0300   BUN 10 03/01/2020 0000   CREATININE 0.95 06/01/2020 0300   CALCIUM 9.8 06/01/2020 0300   PROT 6.7 05/31/2020 0610   ALBUMIN 2.7 (L) 05/31/2020 0610   AST 96 (H) 05/31/2020 0610   ALT 29 05/31/2020 0610   ALKPHOS 106  05/31/2020 0610   BILITOT 0.9 05/31/2020 0610   GFRNONAA 58 (L) 06/01/2020 0300   GFRAA  05/30/2020 2055    QUESTIONABLE RESULTS, RECOMMEND RECOLLECT TO VERIFY   Lipase     Component Value Date/Time   LIPASE 21 06/17/2020 1721       Studies/Results: DG CHEST PORT 1 VIEW  Result Date: 06/01/2020 CLINICAL DATA:  Aspiration. EXAM: PORTABLE CHEST 1 VIEW COMPARISON:  05/30/2020. FINDINGS: Left PICC line in stable position. Heart size stable. Low lung volumes. Persistent infiltrate left upper lung and left base. Persistent left-sided pleural effusion. No interim change. No pneumothorax. Degenerative changes thoracic spine and right shoulder. Surgical clips noted over the right neck. IMPRESSION: 1. Left PICC line stable position. 2. Low lung volumes. Persistent infiltrate left upper lung and left base. Persistent left-sided pleural effusion. No interim change. Electronically Signed   By: Maisie Fus  Register   On: 06/01/2020 05:47   DG CHEST PORT 1 VIEW  Result Date: 05/30/2020 CLINICAL DATA:  Hypoxia EXAM: PORTABLE CHEST 1 VIEW COMPARISON:  05/29/2020 FINDINGS: Worsening airspace disease in the left mid and lower lung. Possible layering left effusion. Right lung clear. Heart is normal size. IMPRESSION: Worsening left mid and lower lung consolidation concerning for pneumonia. Possible  layering left effusion. Electronically Signed   By: Charlett Nose M.D.   On: 05/30/2020 21:27   DG Abd Portable 1V  Result Date: 06/01/2020 CLINICAL DATA:  NG tube placement EXAM: PORTABLE ABDOMEN - 1 VIEW COMPARISON:  05/27/2020 FINDINGS: NG tube coils in the large hiatal hernia in the left lower chest. IMPRESSION: NG tube coils in the large hiatal hernia. Electronically Signed   By: Charlett Nose M.D.   On: 06/01/2020 01:09    Anti-infectives: Anti-infectives (From admission, onward)   Start     Dose/Rate Route Frequency Ordered Stop   05/31/20 2200  ceFAZolin (ANCEF) IVPB 2g/100 mL premix        2 g 200 mL/hr  over 30 Minutes Intravenous Every 12 hours 05/31/20 0837     05/29/20 1800  ceFAZolin (ANCEF) IVPB 2g/100 mL premix  Status:  Discontinued        2 g 200 mL/hr over 30 Minutes Intravenous Every 12 hours 05/29/20 0951 05/29/20 0954   05/29/20 1800  ceFAZolin (ANCEF) IVPB 2g/100 mL premix  Status:  Discontinued        2 g 200 mL/hr over 30 Minutes Intravenous Every 12 hours 05/29/20 0954 05/29/20 1512   05/29/20 1600  ceFAZolin (ANCEF) IVPB 2g/100 mL premix  Status:  Discontinued        2 g 200 mL/hr over 30 Minutes Intravenous Every 8 hours 05/29/20 1512 05/31/20 0837   05/27/20 2000  vancomycin (VANCOREADY) IVPB 500 mg/100 mL  Status:  Discontinued        500 mg 100 mL/hr over 60 Minutes Intravenous Every 24 hours 05/26/20 1800 05/29/20 0951   05/26/20 2000  vancomycin (VANCOCIN) IVPB 1000 mg/200 mL premix        1,000 mg 200 mL/hr over 60 Minutes Intravenous  Once 05/26/20 1752 05/26/20 2245   05/26/20 1830  ceFEPIme (MAXIPIME) 2 g in sodium chloride 0.9 % 100 mL IVPB  Status:  Discontinued        2 g 200 mL/hr over 30 Minutes Intravenous Every 12 hours 05/26/20 1750 05/29/20 0944   06/17/2020 2100  cefoTEtan (CEFOTAN) 1 g in sodium chloride 0.9 % 100 mL IVPB        1 g 200 mL/hr over 30 Minutes Intravenous On call to O.R. 06/14/2020 2008 06/01/2020 2112       Assessment/Plan POD 9, s/p ex lap with SBR secondary to adhesive band with closed loop SBO, Dr. Maisie Fus on 06/13/2020 -NGT replaced overnight but pulled because it was in her hiatal hernia.  This is totally ok.  Will have this replaced today to avoid aspiration.  Will also order a CT scan given increasing WBC up to 28K today.  She is AF. -still on low dose neo  Lactic acidosis 2/2 ischemic bowel - Cleared on follow up lactic acid level 12/6  Acute blood loss anemia combined with dilutional anemia - HGB6.8 today.  Getting 1 unit  Non-severe moderate degree of malnutrition, present on admission -Continue TPN till tolerating  nutritional requirements PO  Post-extubation stridor/VDRF/PNA -extubated on 12/12 -on NRB today -abx per CCM for PNA  Atrial fibrillation with rapid ventricular response - Appreciate critical care team's assistance  -on lovenox, diltiazem  Hypothyroidism - home medications  Hyperglycemia due to acute illness - SSI  FEN: IVF, TNA ID: cefepime per critical care VTE: SCDs, 50 lovenox for a fib Foley: Continue Dispo: Continued ICU care, partial DNR.  Palliative care consult to help discuss GOC with family  LOS: 9 days    Letha Cape , [facility] Surgery 06/01/2020, 7:59 AM Please see Amion for pager number during day hours 7:00am-4:30pm or 7:00am -11:30am on weekends

## 2020-06-01 NOTE — Progress Notes (Signed)
PHARMACY NOTE:  ANTIMICROBIAL RENAL DOSAGE ADJUSTMENT  Current antimicrobial regimen includes a mismatch between antimicrobial dosage and estimated renal function.  As per policy approved by the Pharmacy & Therapeutics and Medical Executive Committees, the antimicrobial dosage will be adjusted accordingly.  Current antimicrobial dosage:  Cefazolin 2 g iv q 12 hours  Indication: MSSA PNA  Renal Function:  Estimated Creatinine Clearance: 32.9 mL/min (by C-G formula based on SCr of 0.95 mg/dL). []      On intermittent HD, scheduled: []      On CRRT    Antimicrobial dosage has been changed to:  Cefazolin 2 g iv q 8 hours with improvement in SCr  Additional comments:   Thank you for allowing pharmacy to be a part of this patient's care.  , [facility] 06/01/2020 10:00 AM

## 2020-06-01 NOTE — Consult Note (Addendum)
Consultation Note Date: 06/01/2020   Patient Name: Lisa Murray  DOB: 04-Jul-1930  MRN: [id number]  Age / Sex: 86 y.o., female  PCP: Mila Palmer, MD Referring Physician: Montez Morita, Md, MD  Reason for Consultation: Establishing goals of care  HPI/Patient Profile: 86 y.o. female  with past medical history of hypothyroidism, osteoporosis admitted on 05/19/2020 with acute abdominal pain and found to have closed-loop small bowel obstruction.  She underwent ex lap with small bowel resection primary anastomosis and lysis of adhesions.  Her course after surgery has been complicated by stridor post extubation requiring reintubation, hypotension on vasopressors, anemia hernia.  She has required reintubation twice since surgery and had episode last night of emesis while on BiPAP with concern for aspiration.  Palliative consult for goals of care..   Clinical Assessment and Goals of Care: I saw and examined Ms. Glynn.  She does not respond to gentle verbal or tactile stimulation and she is not able to participate in conversation regarding goals of care.  I called and was able to reach her nephew, Dan Maker.  He reports that the care team has been doing a good job keeping him updated about her situation.  He tells me that he has a degree in emergency medicine and so he is familiar with much of the terminology and treatments that have been discussed and he understands what is going on well.    We reviewed her clinical course with multiple intubations and concern that she has continued to decline despite aggressive medical intervention.  Discussed clinical course overnight with increased WBC, fever, and vomiting with concern for aspiration.  Concepts specific to code status and care plan this hospitalization discussed.  We discussed difference between a aggressive medical intervention path and a palliative, comfort focused care  path.  Values and goals of care important to patient and family were attempted to be elicited.  Questions and concerns addressed.   PMT will continue to support holistically.   SUMMARY OF RECOMMENDATIONS   -Partial code.  No CPR in the event of cardiac arrest.  Her nephew believes that she would want to be reintubated if necessary.  He states that family would be agreeable to tracheostomy if offered. -Discussed my concern that she appears to be continuing to decline despite best medical interventions and this may be an irreversible process regardless of interventions pursued moving forward. -We will continue to follow her clinical course and continue discussion with family.  I am concerned that if she does not improve with current interventions, continuing to escalate care to reintubation and any consideration for tracheostomy would result in a very diminished quality of life compared to that which she enjoyed prior to this admission.  Code Status/Advance Care Planning:  Limited code  Prognosis:  Guarded Discharge Planning: To Be Determined      Primary Diagnoses: Present on Admission: . Small bowel obstruction (HCC) . SBO (small bowel obstruction) (HCC) . Malnutrition of moderate degree   I have reviewed the medical record, interviewed the patient and family,  and examined the patient. The following aspects are pertinent.  Past Medical History:  Diagnosis Date  . Dizziness 02/27/2020  . Hypothyroidism 02/27/2020  . Senile osteoporosis 02/27/2020   Social History   Socioeconomic History  . Marital status: Divorced    Spouse name: Not on file  . Number of children: Not on file  . Years of education: Not on file  . Highest education level: Not on file  Occupational History  . Not on file  Tobacco Use  . Smoking status: Never Smoker  . Smokeless tobacco: Never Used  Substance and Sexual Activity  . Alcohol use: Never  . Drug use: Not on file  . Sexual activity: Not on file   Other Topics Concern  . Not on file  Social History Narrative  . Not on file   Social Determinants of Health   Financial Resource Strain: Not on file  Food Insecurity: Not on file  Transportation Needs: Not on file  Physical Activity: Not on file  Stress: Not on file  Social Connections: Not on file   History reviewed. No pertinent family history. Scheduled Meds: . chlorhexidine  15 mL Mouth Rinse BID  . Chlorhexidine Gluconate Cloth  6 each Topical Q0600  . enoxaparin (LOVENOX) injection  1 mg/kg Subcutaneous Q24H  . insulin aspart  2-6 Units Subcutaneous Q4H  . levothyroxine  44 mcg Intravenous Daily  . LORazepam  0.5 mg Intravenous BID  . mouth rinse  15 mL Mouth Rinse q12n4p  . pantoprazole (PROTONIX) IV  40 mg Intravenous QHS  . sodium chloride flush  10-40 mL Intracatheter Q12H   Continuous Infusions: . sodium chloride Stopped (05/31/20 2131)  . sodium chloride    . ceFEPime (MAXIPIME) IV Stopped (06/01/20 1431)  . diltiazem (CARDIZEM) infusion 5 mg/hr (06/01/20 2043)  . phenylephrine (NEO-SYNEPHRINE) Adult infusion 10 mcg/min (06/01/20 1600)  . TPN ADULT (ION) 77 mL/hr at 06/01/20 2043   PRN Meds:.Place/Maintain arterial line **AND** sodium chloride, acetaminophen, bisacodyl, fentaNYL (SUBLIMAZE) injection, haloperidol lactate, ipratropium-albuterol, [DISCONTINUED] ondansetron **OR** ondansetron (ZOFRAN) IV, phenol, sodium chloride flush Medications Prior to Admission:  Prior to Admission medications   Medication Sig Start Date End Date Taking? Authorizing Provider  acetaminophen (TYLENOL) 650 MG CR tablet Take 650 mg by mouth every 6 (six) hours as needed for pain.    [provider]  ALPRAZolam Prudy Feeler) 0.25 MG tablet Take 1-2 tablets (0.25-0.5 mg total) by mouth See admin instructions. Take 1 tablet (0.25 mg) in the morning and take 2 tablet (0.50mg )at night 03/15/20   Ngetich, Dinah C, NP  amLODipine (NORVASC) 2.5 MG tablet Take 1 tablet (2.5 mg total) by  mouth daily. 03/15/20   Ngetich, Dinah C, NP  Azelastine HCl 0.15 % SOLN Place 2 sprays into both nostrils daily. 03/15/20   Ngetich, Dinah C, NP  bisacodyl (DULCOLAX) 10 MG suppository Place 10 mg rectally as needed for moderate constipation.    [provider]  buPROPion (WELLBUTRIN SR) 150 MG 12 hr tablet Take 1 tablet (150 mg total) by mouth 2 (two) times daily. 03/15/20   Ngetich, Dinah C, NP  Calcium Carbonate-Vitamin D (OSCAL 500/200 D-3 PO) Take 1 tablet by mouth daily.    [provider]  denosumab (PROLIA) 60 MG/ML SOSY injection Inject 60 mg into the skin every 6 (six) months.    [provider]  docusate sodium (COLACE) 100 MG capsule Take 1 capsule (100 mg total) by mouth 2 (two) times daily. 02/24/20   Dietrich Pates, PA-C  esomeprazole (NEXIUM) 20 MG capsule Take 20 mg by mouth daily at 12 noon.    [provider]  guaiFENesin (MUCINEX) 600 MG 12 hr tablet Take by mouth 2 (two) times daily. 1 tablet by mouth every 12 hours as needed for mucous secretions.    [provider]  Magnesium Hydroxide (MILK OF MAGNESIA PO) Take 30 mLs by mouth as needed.    [provider]  Menthol, Topical Analgesic, (BIOFREEZE) 4 % GEL Apply topically.    [provider]  mirtazapine (REMERON SOL-TAB) 30 MG disintegrating tablet Take 1 tablet (30 mg total) by mouth at bedtime. 03/15/20   Ngetich, Dinah C, NP  Multiple Vitamin (MULTIVITAMIN WITH MINERALS) TABS tablet Take 1 tablet by mouth daily.    [provider]  Omega-3 Fatty Acids (FISH OIL PO) Take 1 capsule by mouth daily.    [provider]  polyethylene glycol (MIRALAX) 17 g packet Take 17 g by mouth daily as needed. 02/24/20   Khatri, Hina, PA-C  Psyllium (METAMUCIL FIBER PO) Take by mouth. Mix 1 packet with 8 oz of water, and drink daily for constipation. Hold for loose stool    [provider]  rosuvastatin (CRESTOR) 10 MG tablet Take 1 tablet (10 mg total) by mouth  daily. 03/15/20   Ngetich, Dinah C, NP  Sodium Phosphates (RA SALINE ENEMA RE) Place rectally as needed.    [provider]  SYNTHROID 88 MCG tablet Take 1 tablet (88 mcg total) by mouth every morning. 03/15/20   Ngetich, Dinah C, NP  VITAMIN D PO Take 2 capsules by mouth daily.    [provider]   Allergies  Allergen Reactions  . Iodine Hives  . Codeine Nausea And Vomiting  . Contrast Media [Iodinated Diagnostic Agents]   . Doxycycline Nausea And Vomiting  . Fosamax [Alendronate] Nausea And Vomiting  . Macrobid [Nitrofurantoin] Nausea And Vomiting  . Tramadol Nausea And Vomiting  . Erythromycin Rash  . Morphine And Related Anxiety  . Novocain [Procaine] Anxiety  . Penicillin G Benzathine Rash  . Sulfa Antibiotics Rash   Review of Systems  Unable to obtain  Physical Exam General: Somnolent Heart: Regular rate and rhythm. No murmur appreciated. Lungs: Fair air movement, crackles in bases Abdomen: Soft, nontender, nondistended, positive bowel sounds.  Ext: No significant edema Skin: Warm and dry   Vital Signs: BP (!) 122/49   Pulse 82   Temp 99.5 F (37.5 C) (Axillary)   Resp (!) 35   Ht 5\' 2"  (1.575 m)   Wt 49.9 kg   SpO2 93%   BMI 20.12 kg/m  Pain Scale: CPOT   Pain Score: 10-Worst pain ever   SpO2: SpO2: 93 % O2 Device:SpO2: 93 % O2 Flow Rate: .O2 Flow Rate (L/min): 15 L/min  IO: Intake/output summary:   Intake/Output Summary (Last 24 hours) at 06/01/2020 2209 Last data filed at 06/01/2020 2043 Gross per 24 hour  Intake 3001.04 ml  Output 2075 ml  Net 926.04 ml    LBM: Last BM Date: 06/01/20 Baseline Weight: Weight: 50.8 kg Most recent weight: Weight: 49.9 kg     Palliative Assessment/Data:   Flowsheet Rows   Flowsheet Row Most Recent Value  Intake Tab   Referral Department Surgery  Unit at Time of Referral ICU  Palliative Care Primary Diagnosis Other (Comment)  [GI]  Date Notified 05/31/20  Palliative Care Type New  Palliative care  Reason for referral Clarify Goals of Care  Date of Admission 05-02-20  Date first seen by Palliative Care 06/01/20  # of days Palliative referral response time 1 Day(s)  # of days IP prior to Palliative referral 8  Clinical Assessment   Palliative Performance Scale Score 20%  Psychosocial & Spiritual Assessment   Palliative Care Outcomes   Patient/Family meeting held? Yes  Who was at the meeting? Nephew via phone  Palliative Care Outcomes Clarified goals of care      Time In: 1145 Time Out: 1300 Time Total: 75 minutes Greater than 50%  of this time was spent counseling and coordinating care related to the above assessment and plan.  Signed by: Romie Minus, MD   Please contact Palliative Medicine Team phone at [phone] for questions and concerns.  For individual provider: See Loretha Stapler

## 2020-06-01 NOTE — Progress Notes (Addendum)
NAME:  Lisa Murray, MRN:  [id number], DOB:  Oct 27, 1930, LOS: 9 ADMISSION DATE:  06-18-2020, CONSULTATION DATE:  06-18-2020 REFERRING MD:  Romie Levee, CHIEF COMPLAINT:  Stridor  Brief History   86 year old F with a history of hypothyroidism, osteoporosis, admitted 12/5 with acute abdominal pain, n/v in the setting of a closed loop small bowel obstruction. She underwent an ex-lap with small bowel resection, primary anastomosis & lysis of adhesions.  Course complicated by stridor post extubation requiring reintubation, hypotension on vasopressors, anemia and difficulty with placement of feeding tube in the setting of hiatal hernia.     Past Medical History  Hx of Fall, requiring hospitalization and rehab Hypothyroidism Osteoporosis HTN S/p ab hysterectomy, bladder repair Appendectomy  Lives independently at baseline, independent of all ADL's prior to current admit (12/5)  Significant Hospital Events   12/05 Admit with abd pain, s/p ex-lab with small bowel resection & lysis of adhesions 12/08 Extubated, failed / hypoxic, reintubated with observed thick secretions at cords 12/09 Episodes of bradycardia on precedex + neo. Levophed changed to neo in setting of AFwRVR 12/13 Extubated  Consults:  PCCM  Procedures:  ETT 12/5 >12/8, 12/8 > 12/12 L Radial ALine 12/5 > 12/8  Left Brachial 12/09 >>   Significant Diagnostic Tests:  CT abd/pelv 12/5 > features of high-grade small bowel obstruction a closed loop in the right lower quadrant demonstrating dilatation, thickening and adjacent inflammation and mural hypoattenuation concerning for vascular compromise. Possibly attributable to a post surgical adhesion given history of hysterectomy and appendectomy. Delayed right renal nephrogram with moderate hydroureteronephrosis with the distal ureter deviated towards the right lower quadrant and possibly involved by the obstructive process in the right lower quadrant. Mild mural thickening of the  cecum though this is possibly reactive. Large hiatal hernia with a degree of likely organoaxial twisting with the pylorus remaining below the level of the diaphragm. Distended appearance of the proximal stomach could reflect some partial obstruction. Unchanged compression deformities and vertebral body height loss involving the L1, L2 and L4 levels. Aortic Atherosclerosis. ECHO 12/10 >> mod to severe MR, severe TR, midlly elevated PASP. LVEF 65-70% with G1DD   Micro Data:  COVID 12/5 > negative  Influenza 12/5 > negative  UC 12/5 > less than 10k colonies, insignificant growth Tracheal aspirate 12/8 > few GNR, abundant staph aureus & few K. Pneumoniae> K pneumonia R ampicillin, otherwise pan-sensitive, staph aureus sensitivities still pending  Antimicrobials:  Vanco 12/8 > 12/11 Cefepime 12/8 > 12/11 Cefazolin 12/11  Interim history/subjective:  Failed precedex 12/13 Was given 1 dose of Haldol 12/13 overnight Currently obtunded Sats are  100% on 50% NRB, but RR is 33 Pt. Had episode of vomiting over night while on BiPAP New fever this am of 99.4, WBC is 28.4 Concerned she may have  aspirated. K of 3.1, K Phos repleted Palliation has been consulted this morning Remains on cardizem at 5 mg, rate controlled 83 Remains on Neo at 20 + 1900 cc's  1010 cc's UO last 24 Received 1 unit blood for HGB 6.8, 1 gram drop over night  Objective   Blood pressure (!) 126/59, pulse 82, temperature 99.3 F (37.4 C), temperature source Axillary, resp. rate (!) 32, height 5\' 2"  (1.575 m), weight 49.9 kg, SpO2 100 %.    Vent Mode: BIPAP;PCV FiO2 (%):  [50 %] 50 % Set Rate:  [15 bmp] 15 bmp PEEP:  [5 cmH20] 5 cmH20   Intake/Output Summary (Last 24 hours) at 06/01/2020  1126 Last data filed at 06/01/2020 0800 Gross per 24 hour  Intake 2047.05 ml  Output 1450 ml  Net 597.05 ml  net for admission  Filed Weights   05/26/2020 1707 05/24/20 0500 05/27/20 0838  Weight: 50.8 kg 50.5 kg 49.9 kg     Examination: General: obtunded female , supine in bed , will squeeze hand , but does not follow any additional commands HEENT: Temporal wasting, MM dry, No JVD or LAD, NG tube to suction, Neuro: obtunded,  spontaneously moves all extremities, pupils intact and reactive, did squeeze hand to command   CV: S1, S2, , Irregular, no MRG  PULM: bilateral chest excursion, coarse breath sounds, slight tachypnea, diminished per bases. GI: soft, NT, ND. Incision without erythema, BS +. Extremities: warm, dry, no edema, no rash, no lesions Skin: multiple bruising noted throughout   Resolved Hospital Problem list   Lactic Acidosis secondary to Bowel Ischemia  Post-extubation stridor requiring reintubation post-op  Assessment & Plan:   Encephalopathy, Agitation. Multifactorial. In setting of Shock,  Concern for Benzo withdrawal, patient takes Xanax BID at home Precedex failed, currently not infusing Plan  - Add Scheduled Ativan BID , Hold if obtunded - PRN Haldol. Monitor QTC.  - Neuro Checks - Consider CT Head  Acute respiratory failure due to upper airway obstruction Acute pulmonary edema  Staph aureus pneumonia Post-extubation stridor- resolved Concern for aspiration 12/14 after episode of emesis  Failed extubation post-op, reintubated for stridor post-op which may have been related to contrast pre-op for imaging, treated with steroids.   Failed again 12/8 with hypoxia, poor effort.  Extubated 12/12, but vomited and concern for aspiration with new fever 12/14 Plan - CXR now  - continue ancef to complete 7 days of appropriate antibiotics - Will add cefepime per pharmacy - Continue NRB  - Titrate oxygen for sats of 88%  -Ongoing discussion with family regarding GOC - Palliative care consulted 12/14  SBO, s/p Ex-Lap and partial resection Large hiatal hernia in left hemithorax Moderate malnutrition  Plan - Per Surgery  - con't to hold enteral meds and nutrition until bowel  function returns - Continue TPN - CT Abdomen per surgery with episode of emesis 12/14  and WBC of 28K , and HGB drop to 6.8 to better evaluate potential etiologies  Shock due to sedation Plan  Suspect multifactorial in the setting of sedation for mechanical ventilation, AFwithRVR. Cortisol 19. Plan    -con't neosynephrine; had more Afib on norepi. Quad strength neosynephrine.  -titrate pressors to maintain MAP>65. Wean as able.  Paroxysmal A- fib w RVR Plan  -Cardiac Monitoring - Currently has been weaned to 5 mg cardizem gtt.   -Metoprolol PRN,  - Can not receive Amiodarone due to allergy   Hypokalemia - repleted 12/14 Hypophosphatemia - repleted 12/14 Refeeding syndrome Plan -con't to monitor and replete aggressively -goal K >4, Mg >2 with AFib  Hypothyroidism Plan -continue IV synthroid until able to take PO's   Hypergylcemia -controlled Plan -not requiring insulin -con't accuchecks Q4h -goal BG 140-180 if requiring insulin while hospitalized  Acute anemia Suspect multifactorial in setting of operative blood loss and dilutional component  HGB drop 1 gram overnight 12/14 ( + 1800 cc's) Plan -Trend CBC  -transfuse for Hgb <7% or hemodynamically significant bleeding - Transfused 1 unit PRBC's 12/14  Moderate Protein Calorie Malnutrition Refeeding Syndrome Plan -TPN per pharmacy -aggressive electrolyte repletion   Best practice (evaluated daily)  Diet: NPO DVT prophylaxis: lovenox  GI prophylaxis: protonix Glucose  control: SSI Mobility: progressive Last date of multidisciplinary goals of care discussion: per primary team. Spoke to [place], nephew and POA. Decision made for no CPR/ACLS. Currently okay with intubation, >> still ongoing discussion regarding this Family and staff present: Summary of discussion: Follow up goals of care discussion due:  Code Status: Full Code  Disposition: ICU   Palliative care consulted 06/01/2020  Labs   CBC: Recent Labs   Lab 05/26/20 0450 05/26/20 1644 05/27/20 0319 05/28/20 0257 05/29/20 0416 05/31/20 0610 06/01/20 0300  WBC 8.9  --  15.9* 14.3* 15.8* 26.0* 28.4*  NEUTROABS 7.7  --   --  11.6*  --  21.4*  --   HGB 7.0*   < > 8.4* 7.9* 7.4* 7.8* 6.8*  HCT 21.7*   < > 26.1* 23.7* 23.0* 23.8* 20.9*  MCV 100.5*  --  97.0 96.3 99.1 97.5 99.1  PLT 112*  --  152 165 179 209 210   < > = values in this interval not displayed.    Basic Metabolic Panel: Recent Labs  Lab 05/29/20 0416 05/29/20 2253 05/30/20 0443 05/30/20 2055 05/30/20 2223 05/31/20 0610 06/01/20 0300  NA 141 140 137 QUESTIONABLE RESULTS, RECOMMEND RECOLLECT TO VERIFY 141 140 144  K 2.8* 4.6 4.5 QUESTIONABLE RESULTS, RECOMMEND RECOLLECT TO VERIFY 3.9 4.0 3.1*  CL 107 101 97* QUESTIONABLE RESULTS, RECOMMEND RECOLLECT TO VERIFY 97* 100 107  CO2 24 26 26  QUESTIONABLE RESULTS, RECOMMEND RECOLLECT TO VERIFY 28 27 25   GLUCOSE 184* 114* 119* QUESTIONABLE RESULTS, RECOMMEND RECOLLECT TO VERIFY 151* 157* 134*  BUN 22 30* 34* QUESTIONABLE RESULTS, RECOMMEND RECOLLECT TO VERIFY 47* 57* 58*  CREATININE 0.74 1.01* 1.04* QUESTIONABLE RESULTS, RECOMMEND RECOLLECT TO VERIFY 1.15* 1.28* 0.95  CALCIUM 7.6* 8.7* 9.2 QUESTIONABLE RESULTS, RECOMMEND RECOLLECT TO VERIFY 9.1 9.2 9.8  MG 1.5* 3.4* 3.1*  --   --  2.7* 2.4  PHOS 2.4* 4.9* 5.3*  --   --  2.9 2.0*   GFR: Estimated Creatinine Clearance: 32.9 mL/min (by C-G formula based on SCr of 0.95 mg/dL). Recent Labs  Lab 05/28/20 0257 05/29/20 0416 05/31/20 0610 06/01/20 0300  WBC 14.3* 15.8* 26.0* 28.4*    Liver Function Tests: Recent Labs  Lab 05/26/20 1957 05/27/20 0319 05/28/20 0257 05/31/20 0610  AST 81* 53* 134* 96*  ALT 32 29 105* 29  ALKPHOS 64 56 93 106  BILITOT 2.9* 2.2* 1.8* 0.9  PROT 6.4* 5.7* 5.6* 6.7  ALBUMIN 3.4* 2.9* 2.6* 2.7*   No results for input(s): LIPASE, AMYLASE in the last 168 hours. No results for input(s): AMMONIA in the last 168 hours.  ABG    Component  Value Date/Time   PHART 7.530 (H) 05/30/2020 2116   PCO2ART 33.8 05/30/2020 2116   PO2ART 60.8 (L) 05/30/2020 2116   HCO3 28.1 (H) 05/30/2020 2116   TCO2 23 06/05/20 2151   ACIDBASEDEF 2.0 05/26/2020 1954   O2SAT 91.8 05/30/2020 2116     Coagulation Profile: No results for input(s): INR, PROTIME in the last 168 hours.  Cardiac Enzymes: No results for input(s): CKTOTAL, CKMB, CKMBINDEX, TROPONINI in the last 168 hours.  HbA1C: No results found for: HGBA1C  CBG: Recent Labs  Lab 05/31/20 1558 05/31/20 1950 05/31/20 2352 06/01/20 0337 06/01/20 0748  GLUCAP 90 121* 123* 132* 126*    This patient is critically ill with multiple organ system failure which requires frequent high complexity decision making, assessment, support, evaluation, and titration of therapies. This was completed through the application of advanced monitoring  technologies and extensive interpretation of multiple databases. During this encounter critical care time was devoted to patient care services described in this note for 30 minutes.   Bevelyn Ngo, MSN, AGACNP-BC [facility] See Amion for personal pager PCCM on call pager [phone] 06/01/2020 12:06 PM

## 2020-06-02 ENCOUNTER — Inpatient Hospital Stay (HOSPITAL_COMMUNITY): Payer: PPO

## 2020-06-02 ENCOUNTER — Inpatient Hospital Stay (HOSPITAL_COMMUNITY): Payer: PPO | Admitting: Certified Registered Nurse Anesthetist

## 2020-06-02 DIAGNOSIS — T17908A Unspecified foreign body in respiratory tract, part unspecified causing other injury, initial encounter: Secondary | ICD-10-CM

## 2020-06-02 LAB — BLOOD GAS, ARTERIAL
Acid-base deficit: 2.8 mmol/L — ABNORMAL HIGH (ref 0.0–2.0)
Acid-base deficit: 0.5 mmol/L (ref 0.0–2.0)
Bicarbonate: 24.6 mmol/L (ref 20.0–28.0)
Bicarbonate: 22.8 mmol/L (ref 20.0–28.0)
FIO2: 100
FIO2: 70
MECHVT: 450 mL
O2 Saturation: 98.8 %
O2 Saturation: 96.7 %
Patient temperature: 100
Patient temperature: 98.6
RATE: 21 resp/min
pCO2 arterial: 61.4 mmHg — ABNORMAL HIGH (ref 32.0–48.0)
pCO2 arterial: 34.1 mmHg (ref 32.0–48.0)
pH, Arterial: 7.232 — ABNORMAL LOW (ref 7.350–7.450)
pH, Arterial: 7.441 (ref 7.350–7.450)
pO2, Arterial: 223 mmHg — ABNORMAL HIGH (ref 83.0–108.0)
pO2, Arterial: 86.3 mmHg (ref 83.0–108.0)

## 2020-06-02 LAB — GLUCOSE, CAPILLARY
Glucose-Capillary: 158 mg/dL — ABNORMAL HIGH (ref 70–99)
Glucose-Capillary: 157 mg/dL — ABNORMAL HIGH (ref 70–99)
Glucose-Capillary: 157 mg/dL — ABNORMAL HIGH (ref 70–99)
Glucose-Capillary: 149 mg/dL — ABNORMAL HIGH (ref 70–99)

## 2020-06-02 LAB — COMPREHENSIVE METABOLIC PANEL
ALT: 42 U/L (ref 0–44)
AST: 98 U/L — ABNORMAL HIGH (ref 15–41)
Albumin: 3 g/dL — ABNORMAL LOW (ref 3.5–5.0)
Alkaline Phosphatase: 109 U/L (ref 38–126)
Anion gap: 10 (ref 5–15)
BUN: 53 mg/dL — ABNORMAL HIGH (ref 8–23)
CO2: 25 mmol/L (ref 22–32)
Calcium: 9.8 mg/dL (ref 8.9–10.3)
Chloride: 112 mmol/L — ABNORMAL HIGH (ref 98–111)
Creatinine, Ser: 0.67 mg/dL (ref 0.44–1.00)
GFR, Estimated: >60 mL/min (ref >60)
Glucose, Bld: 173 mg/dL — ABNORMAL HIGH (ref 70–99)
Potassium: 4 mmol/L (ref 3.5–5.1)
Sodium: 147 mmol/L — ABNORMAL HIGH (ref 135–145)
Total Bilirubin: 0.9 mg/dL (ref 0.3–1.2)
Total Protein: 6.7 g/dL (ref 6.5–8.1)

## 2020-06-02 LAB — CBC WITH DIFFERENTIAL/PLATELET
Abs Immature Granulocytes: 1.15 10*3/uL — ABNORMAL HIGH (ref 0.00–0.07)
Basophils Absolute: 0.1 10*3/uL (ref 0.0–0.1)
Basophils Relative: 0 %
Eosinophils Absolute: 0 10*3/uL (ref 0.0–0.5)
Eosinophils Relative: 0 %
HCT: 31.5 % — ABNORMAL LOW (ref 36.0–46.0)
Hemoglobin: 9.8 g/dL — ABNORMAL LOW (ref 12.0–15.0)
Immature Granulocytes: 3 %
Lymphocytes Relative: 4 %
Lymphs Abs: 1.4 10*3/uL (ref 0.7–4.0)
MCH: 31.2 pg (ref 26.0–34.0)
MCHC: 31.1 g/dL (ref 30.0–36.0)
MCV: 100.3 fL — ABNORMAL HIGH (ref 80.0–100.0)
Monocytes Absolute: 2.3 10*3/uL — ABNORMAL HIGH (ref 0.1–1.0)
Monocytes Relative: 7 %
Neutro Abs: 28.8 10*3/uL — ABNORMAL HIGH (ref 1.7–7.7)
Neutrophils Relative %: 86 %
Platelets: 249 10*3/uL (ref 150–400)
RBC: 3.14 MIL/uL — ABNORMAL LOW (ref 3.87–5.11)
RDW: 16 % — ABNORMAL HIGH (ref 11.5–15.5)
WBC: 33.8 10*3/uL — ABNORMAL HIGH (ref 4.0–10.5)
nRBC: 0.1 % (ref 0.0–0.2)

## 2020-06-02 LAB — TYPE AND SCREEN
ABO/RH(D): A POS
Antibody Screen: NEGATIVE
Unit division: 0

## 2020-06-02 LAB — BPAM RBC
Blood Product Expiration Date: 202201112359
ISSUE DATE / TIME: 202112140805
Unit Type and Rh: 6200

## 2020-06-02 LAB — PROTIME-INR
INR: 1.1 (ref 0.8–1.2)
Prothrombin Time: 13.6 seconds (ref 11.4–15.2)

## 2020-06-02 LAB — MAGNESIUM: Magnesium: 2.2 mg/dL (ref 1.7–2.4)

## 2020-06-02 LAB — PHOSPHORUS: Phosphorus: 4.3 mg/dL (ref 2.5–4.6)

## 2020-06-02 MED ORDER — FENTANYL 2500MCG IN NS 250ML (10MCG/ML) PREMIX INFUSION
0.0000 ug/h | INTRAVENOUS | Status: DC
  Administered 2020-06-02: 17:00:00 50 ug/h via INTRAVENOUS
  Administered 2020-06-03: 07:00:00 150 ug/h via INTRAVENOUS
  Administered 2020-06-03 – 2020-06-04 (×2): 225 ug/h via INTRAVENOUS
  Administered 2020-06-04: 18:00:00 325 ug/h via INTRAVENOUS
  Filled 2020-06-02 (×4): qty 250

## 2020-06-02 MED ORDER — GLYCOPYRROLATE 0.2 MG/ML IJ SOLN: 0.2000 mg | INTRAMUSCULAR | Status: DC | PRN

## 2020-06-02 MED ORDER — ORAL CARE MOUTH RINSE
15.0000 mL | OROMUCOSAL | Status: DC
  Administered 2020-06-02 (×3): 15 mL via OROMUCOSAL

## 2020-06-02 MED ORDER — FENTANYL BOLUS VIA INFUSION
25.0000 ug | INTRAVENOUS | Status: DC | PRN
Start: 2020-06-02 — End: 2020-06-03
  Administered 2020-06-02: 25 ug via INTRAVENOUS
  Filled 2020-06-02: qty 25

## 2020-06-02 MED ORDER — GLYCOPYRROLATE 0.2 MG/ML IJ SOLN
0.2000 mg | INTRAMUSCULAR | Status: DC | PRN
  Administered 2020-06-04: 0.2 mg via INTRAVENOUS
  Filled 2020-06-02: qty 1

## 2020-06-02 MED ORDER — FENTANYL CITRATE (PF) 100 MCG/2ML IJ SOLN
25.0000 ug | Freq: Once | INTRAMUSCULAR | Status: AC
Start: 2020-06-02 — End: 2020-06-02
  Administered 2020-06-02: 09:00:00 25 ug via INTRAVENOUS

## 2020-06-02 MED ORDER — POLYVINYL ALCOHOL 1.4 % OP SOLN
1.0000 [drp] | Freq: Four times a day (QID) | OPHTHALMIC | Status: DC | PRN
  Filled 2020-06-02: qty 15

## 2020-06-02 MED ORDER — RACEPINEPHRINE HCL 2.25 % IN NEBU
INHALATION_SOLUTION | RESPIRATORY_TRACT | Status: AC
  Filled 2020-06-02: qty 0.5

## 2020-06-02 MED ORDER — GLYCOPYRROLATE 1 MG PO TABS: 1.0000 mg | ORAL_TABLET | ORAL | Status: DC | PRN

## 2020-06-02 MED ORDER — LORAZEPAM 2 MG/ML IJ SOLN
2.0000 mg | INTRAMUSCULAR | Status: DC | PRN
Start: 2020-06-02 — End: 2020-06-03

## 2020-06-02 MED ORDER — FENTANYL CITRATE (PF) 100 MCG/2ML IJ SOLN: 50.0000 ug | INTRAMUSCULAR | Status: DC | PRN

## 2020-06-02 MED ORDER — LORAZEPAM 2 MG/ML IJ SOLN
0.5000 mg | Freq: Every day | INTRAMUSCULAR | Status: DC
  Administered 2020-06-02: 10:00:00 0.5 mg via INTRAVENOUS
  Filled 2020-06-02: qty 1

## 2020-06-02 MED ORDER — CHLORHEXIDINE GLUCONATE 0.12% ORAL RINSE (MEDLINE KIT)
15.0000 mL | Freq: Two times a day (BID) | OROMUCOSAL | Status: DC
  Administered 2020-06-02: 12:00:00 15 mL via OROMUCOSAL

## 2020-06-02 MED ORDER — DIPHENHYDRAMINE HCL 50 MG/ML IJ SOLN: 25.0000 mg | INTRAMUSCULAR | Status: DC | PRN

## 2020-06-02 MED ORDER — FENTANYL BOLUS VIA INFUSION
100.0000 ug | INTRAVENOUS | Status: DC | PRN
Start: 2020-06-02 — End: 2020-06-05
  Administered 2020-06-03 – 2020-06-04 (×2): 100 ug via INTRAVENOUS
  Filled 2020-06-02: qty 100

## 2020-06-02 MED ORDER — LORAZEPAM 2 MG/ML IJ SOLN: 0.5000 mg | Freq: Two times a day (BID) | INTRAMUSCULAR | Status: DC

## 2020-06-02 MED ORDER — PROPOFOL 1000 MG/100ML IV EMUL
0.0000 ug/kg/min | INTRAVENOUS | Status: DC
  Administered 2020-06-02: 09:00:00 5 ug/kg/min via INTRAVENOUS
  Filled 2020-06-02: qty 100

## 2020-06-02 MED ORDER — DEXTROSE 5 % IV SOLN: INTRAVENOUS | Status: DC

## 2020-06-02 MED ORDER — FENTANYL CITRATE (PF) 100 MCG/2ML IJ SOLN
25.0000 ug | INTRAMUSCULAR | Status: DC | PRN
  Administered 2020-06-02 (×2): 25 ug via INTRAVENOUS
  Administered 2020-06-02 (×3): 50 ug via INTRAVENOUS
  Filled 2020-06-02 (×5): qty 2

## 2020-06-02 MED ORDER — FENTANYL 2500MCG IN NS 250ML (10MCG/ML) PREMIX INFUSION
25.0000 ug/h | INTRAVENOUS | Status: DC
  Administered 2020-06-02: 09:00:00 25 ug/h via INTRAVENOUS
  Filled 2020-06-02: qty 250

## 2020-06-02 MED ORDER — ENOXAPARIN SODIUM 60 MG/0.6ML ~~LOC~~ SOLN
1.0000 mg/kg | Freq: Two times a day (BID) | SUBCUTANEOUS | Status: DC
  Administered 2020-06-02: 10:00:00 50 mg via SUBCUTANEOUS
  Filled 2020-06-02 (×2): qty 0.5

## 2020-06-02 MED ORDER — TRAVASOL 10 % IV SOLN
INTRAVENOUS | Status: DC
  Filled 2020-06-02: qty 900

## 2020-06-02 NOTE — Progress Notes (Signed)
Had a long discussion with Trey Paula, patient's nephew.  I expressed concern over how poorly Ms. Ginzburg is doing.  Despite our efforts she has been reintubated overnight, her WBC continue to increase, and her mental status is essentially obtunded at this point off of sedation.  I expressed that I am concerned we have reach a point where things are unlikely, despite our efforts, to get any better.  He is receptive of this and feels that he gave her a good 24-48 hrs to get better since his last conversations with other team members.  He agrees that she would not want to keep with this level of aggressive care.  He is agreeable to discuss transition to comfort care.  I have reached out to Dr. Kendrick Fries so he can contact Trey Paula to discuss how they would like to proceed with this transition.  I informed him in EOL situations that we are generally able to have family members come visit the patient prior to passing.  He was very grateful and appreciative of our conversation and everyone's care of his aunt.  I have also let Dr. Neale Burly know as well just to keep everyone in the loop.    Letha Cape 12:20 PM 06/02/2020

## 2020-06-02 NOTE — Progress Notes (Signed)
PHARMACY - TOTAL PARENTERAL NUTRITION CONSULT NOTE   Indication: Prolonged ileus s/p bowel resection  Patient Measurements: Height: 5\' 2"  (157.5 cm) Weight: 49.9 kg (110 lb 0.2 oz) IBW/kg (Calculated) : 50.1 TPN AdjBW (KG): 50.5 Body mass index is 20.12 kg/m.  Assessment: [name] s/p small bowel resection. Unable to place NGT in IR due to large hiatal hernia. Pharmacy consulted to initiate TPN for prolonged malnutrition/ileus.   Glucose / Insulin: Goal 150-180 on sSSI q 4 h (12 units/ 24 hrs).  CBGs 126-158 Electrolytes: Na 147, K 4, Phos 4.3, Mag 2.2 Renal: SCr improved off lasix, now WNL LFTs / TGs:  AST 98, other LFTs WNL, Tbili wnl (12/15). TG 194 (12/13), propofol d/c 12/12. Prealbumin / albumin:   6.1  On 12/13/3 Intake / Output; MIVF: UOP 1775 mL; NGO 450 ml GI Imaging: 12/5 CT: closed loop SBO, large hiatal hernia Surgeries / Procedures:  12/5 ex-lap, small bowel resection  Central access:  Triple lumen PICC 12/9 TPN start date: 12/9  Nutritional Goals  Updated RD recs 12/13 since extubated, re-intubated 12/15 AM 1645-1795 kcal, 75-90 g protein, >/= 1.7 L/day Goal TPN rate of 75 ml/hr providing 90 g protein and 1638 kcal per day  Current Nutrition:  NPO  Plan:  Continue TPN at 75 mL/hr to provide full support Electrolytes in TPN: decr to 43mEq/L of Na, 58mEq/L of K, 51mEq/L of Ca, 40mEq/L of Mg, and 95mmol/L of Phos. Cl:Ac ratio 1:1 Add standard MVI and trace elements to TPN Continue Sensitive q4h SSI and adjust as needed  No MIVF Monitor TPN labs on Mon/Thurs  12m, Pharm.D 06/02/2020 8:11 AM

## 2020-06-02 NOTE — Progress Notes (Signed)
Nutrition Follow-up  DOCUMENTATION CODES:   Non-severe (moderate) malnutrition in context of chronic illness  INTERVENTION:  - continue TPN per Pharmacist. - re-weigh patient today.   NUTRITION DIAGNOSIS:   Moderate Malnutrition related to chronic illness as evidenced by mild fat depletion,mild muscle depletion,moderate muscle depletion. -ongoing  GOAL:   Patient will meet greater than or equal to 90% of their needs -to be met with TPN  MONITOR:   Vent status,Labs,Weight trends,I & O's,Other (Comment) (TPN regimen)  REASON FOR ASSESSMENT:   Ventilator  ASSESSMENT:   86 y.o. female with medical history of osteoporosis and hypothyroidism. She had acute abdominal pain which started around 0400 on 12/5 and she had one episode of emesis. Pain was constant and worsening so she presented to the ED later in the day on 12/5. Patient reported recent constipation. She has surgical hx of hysterectomy, appendectomy, and bladder repair. Patient lives independently.  Significant Events: 12/5- admission; ex lap with SBR 12/6- re-intubated; initial RD assessment; OGT malpositioned and unable to be replaced 12/8- extubated; re-intubated; NGT placed in R nare 12/9- NGT removed; triple lumen PICC placed in L brachial; TPN initiation; NGT placed in R nare 12/12- extubated; NGT removed by patient 12/13- episode of emesis while on BiPAP with concern for aspiration 12/14- NGT placed in R nare  NGT placed yesterday morning; abdominal xray report indicates that the tip of tube is within large hiatal hernia and less likely to be in LLL but could not be ruled out. CXR report from today indicates that NGT tip is in the distal esophagus vs in the hiatal hernia.   NGT with <100 ml green-brown output at this time.   Patient was re-intubated today at ~0215 by Anesthesiology. She remains intubated at this time.   No family/visitors present at the time of RD visit. Patient continues to receive custom TPN  at 75 ml/hr which is providing 1638 kcal and 90 grams protein.   Able to talk with Pharmacist and alert her to updated estimated nutrition needs post-intubation. Plan to adjust TPN regimen 12/16.   Patient has not been weighed since 12/9. No edema present at this time.   Palliative Care spoke with patient's nephew yesterday. Patient is a partial code, no CPR in the event of cardiac arrest. Nephew reported that family would be agreeable to trach if it were offered.     Patient is currently intubated on ventilator support MV: 15.6 L/min Temp (24hrs), Avg:98.9 F (37.2 C), Min:97.8 F (36.6 C), Max:100 F (37.8 C) Propofol: 4.5 ml/hr (119 kcal) BP: 107/57 and MAP: 68  Labs reviewed; CBGs: 158 and 157 mg/dl, Na: 147 mmol/l, Cl: 112 mmol/l, BUN: 53 mg/dl, AST elevated. Medications reviewed; sliding scale novolog, 44 mcg IV synthroid/day, 40 mg IV protonix/day, 30 mmol IV KPhos x1 dose 12/14. Drips; propofol @ 15 mch/kg/min, fentanyl @ 50 mcg/hr.    Diet Order:   Diet Order            Diet NPO time specified  Diet effective now                 EDUCATION NEEDS:   Not appropriate for education at this time  Skin:  Skin Assessment: Skin Integrity Issues: Skin Integrity Issues:: Incisions,Stage I,Stage II Stage I: mid lower back (12/8) Stage II: vertebreal column (new documentation 12/14) Incisions: abdomen (12/5)  Last BM:  12/14 (type 6)  Height:   Ht Readings from Last 1 Encounters:  05/25/20 5' 2"  (1.575 m)  Weight:   Wt Readings from Last 1 Encounters:  05/27/20 49.9 kg     Estimated Nutritional Needs:  Kcal:  1442 kcal Protein:  100-115 grams Fluid:  >/= 1.7 L/day      Jarome Matin, MS, RD, LDN, CNSC Inpatient Clinical Dietitian RD pager # available in [place]  After hours/weekend pager # available in [facility]

## 2020-06-02 NOTE — Procedures (Signed)
Extubation Procedure Note  Patient Details:   Name: Lisa Murray DOB: [birth year] MRN: [id number]   Airway Documentation:  Airway 7 mm (Active)  Secured at (cm) 24 cm 06/02/20 1533  Measured From Lips 06/02/20 1533  Secured Location Left 06/02/20 1533  Secured By [employer] 06/02/20 1533  Tube Holder Repositioned Yes 06/02/20 1533  Prone position No 06/02/20 1533  Cuff Pressure (cm H2O) 28 cm H2O 06/02/20 0813  Site Condition Dry 06/02/20 1533   Vent end date: 06/02/20 Vent end time: 1835   Evaluation  O2 sats: currently acceptable Patient did tolerate procedure well. Bilateral Breath Sounds: Clear,Diminished   No   Patient extubated to comfort care.   Dannielle Karvonen 06/02/2020, 6:45 PM

## 2020-06-02 NOTE — Progress Notes (Signed)
eLink Physician-Brief Progress Note Patient Name: Lisa Murray DOB: [birth year] MRN: [id number]   Date of Service  06/02/2020  HPI/Events of Note  Review of CXR for ETT placement: Endotracheal tube 3 cm above the carina.   eICU Interventions  ETT position is satisfactory.      Intervention Category Major Interventions: Other:  Demisha Nokes Dennard Nip 06/02/2020, 3:02 AM

## 2020-06-02 NOTE — Progress Notes (Signed)
Discussed with surgery and PCCM and plan is to transition to comfort with plan for liberation from ventilator once family can visit this evening.      Please call if there are specific needs with which our team can be of assistance.  Romie Minus, MD [facility] Palliative Medicine Team [phone]  NO CHARGE NOTE

## 2020-06-02 NOTE — Progress Notes (Addendum)
NAME:  Lisa Murray, MRN:  [id number], DOB:  Sep 08, 1930, LOS: 10 ADMISSION DATE:  05/28/2020, CONSULTATION DATE:  05/26/2020 REFERRING MD:  Romie Levee, CHIEF COMPLAINT:  Stridor  Brief History   [name] with a history of hypothyroidism, osteoporosis, admitted 12/5 with acute abdominal pain, n/v in the setting of a closed loop small bowel obstruction. She underwent an ex-lap with small bowel resection, primary anastomosis & lysis of adhesions.  Course complicated by stridor post extubation requiring reintubation (initially thought related to contrast allergy), hypotension on vasopressors, anemia and difficulty with placement of feeding tube in the setting of hiatal hernia.  ICU course notable for multiple failed extubations.    Past Medical History  Hx of Fall, requiring hospitalization and rehab Hypothyroidism Osteoporosis HTN S/p ab hysterectomy, bladder repair Appendectomy  Lives independently at baseline, independent of all ADL's prior to current admit (12/5)  Significant Hospital Events   12/05 Admit with abd pain, s/p ex-lab with small bowel resection & lysis of adhesions 12/08 Extubated, failed / hypoxic, reintubated with observed thick secretions at cords 12/09 Episodes of bradycardia on precedex + neo. Levophed changed to neo in setting of AFwRVR 12/13 Extubated 12/14 On 50 mcg/min NEO 12/15 Placed on bipap over night, vomited / concern for aspiration, re-intubated. Off vasopressors. On cardizem  Consults:  PCCM  Procedures:  ETT 12/5 >12/8, 12/8 > 12/12 L Radial ALine 12/5 > 12/8  Left Brachial 12/09 >> discontinued   Significant Diagnostic Tests:  CT abd/pelv 12/5 >> features of high-grade small bowel obstruction a closed loop in the right lower quadrant demonstrating dilatation, thickening and adjacent inflammation and mural hypoattenuation concerning for vascular compromise. Possibly attributable to a post surgical adhesion given history of hysterectomy and  appendectomy. Delayed right renal nephrogram with moderate hydroureteronephrosis with the distal ureter deviated towards the right lower quadrant and possibly involved by the obstructive process in the right lower quadrant. Mild mural thickening of the cecum though this is possibly reactive. Large hiatal hernia with a degree of likely organoaxial twisting with the pylorus remaining below the level of the diaphragm. Distended appearance of the proximal stomach could reflect some partial obstruction. Unchanged compression deformities and vertebral body height loss involving the L1, L2 and L4 levels. Aortic Atherosclerosis. ECHO 12/10 >> mod to severe MR, severe TR, midlly elevated PASP. LVEF 65-70% with G1DD   Micro Data:  COVID 12/5 >> negative  Influenza 12/5 >> negative  UC 12/5 > less than 10k colonies, insignificant growth Tracheal aspirate 12/8 >> MSSA & few K. Pneumoniae> K pneumonia R ampicillin, otherwise pan-sensitive  Antimicrobials:  Vanco 12/8 >> 12/11 Cefepime 12/8 >> 12/11 Cefazolin 12/11 >> 12/13  Cefepime 12/14 >>   Interim history/subjective:  RN reports pt on cardizem 10 mg for PAF rates in 120's, off neosynephrine, intermittent agitation.  RT reported that there was some concern for stridor prior to intubation > note larger ETT placed than previously.  ? Of neck swelling on left.   Afebrile / rising WBC 33.8 Vent - 50%, PEEP 8  Glucose range 158 - 173  I/O - 1.7L UOP, + 372 ml in last 24 hours   Objective   Blood pressure 127/69, pulse (!) 116, temperature 98.1 F (36.7 C), temperature source Oral, resp. rate (!) 28, height 5\' 2"  (1.575 m), weight 49.9 kg, SpO2 100 %.    Vent Mode: PRVC FiO2 (%):  [70 %-100 %] 70 % Set Rate:  [14 bmp-21 bmp] 21 bmp Vt Set:  [  450 mL] 450 mL PEEP:  [8 cmH20] 8 cmH20 Plateau Pressure:  [20 cmH20-21 cmH20] 21 cmH20   Intake/Output Summary (Last 24 hours) at 06/02/2020 0817 Last data filed at 06/02/2020 0739 Gross per 24 hour  Intake  2668.58 ml  Output 1725 ml  Net 943.58 ml  net for admission  Filed Weights   Jun 19, 2020 1707 05/24/20 0500 05/27/20 0838  Weight: 50.8 kg 50.5 kg 49.9 kg    Examination: General: elderly female lying in bed on vent in NAD, intermittent periods of agitation    HEENT: MM pink/moist, ETT, anicteric, HOH, left neck appears swollen when head is turned to the right.  However, when head return to midline there is no residual fullness or swelling on the left.  Question if this is related to positioning and presence of endotracheal tube with thin neck Neuro: tracks provider, does not follow commands, moves all ext's spontaneously / equal strength  CV: s1s2 irr irr, AF 110's on monitor, no m/r/g PULM: non-labored on vent, lungs bilaterally clear  GI: soft, bsx4 active  Extremities: warm/dry, no edema  Skin: no rashes or lesions   Resolved Hospital Problem list   Lactic Acidosis secondary to Bowel Ischemia  Post-extubation stridor requiring reintubation post-op  Assessment & Plan:   Encephalopathy, Agitation. Multifactorial. In setting of Shock,  Concern for Benzo withdrawal, patient takes Xanax BID at home -continue ativan 0.5mg  BID, home xanax use -PRH haldol, follow QTC -frequent reorientation  -ensure hearing aide in place  -RASS Goal 0 to -1  -fentanyl gtt for pain -stop propofol with hypotension  Acute respiratory failure due to upper airway obstruction Acute pulmonary edema  MSSA + Klebsiella pneumonia Post-extubation stridor- resolved Failed extubation post-op, reintubated for stridor post-op which may have been related to contrast pre-op for imaging, treated with steroids.  Failed again 12/8 with hypoxia, poor effort. Extubated 12/12 -continue cefepime  -PRVC 8cc/kg  -wean PEEP / FiO2 for sats >90% -follow intermittent CXR  -multiple re-intubation during course of admit  SBO, s/p Ex-Lap and partial resection Large hiatal hernia in left hemithorax Moderate malnutrition   -per CCS -hold all enteral meds / nutrition  -TPN per pharmacy   Shock due to sedation Suspect multifactorial in the setting of sedation for mechanical ventilation, AFwithRVR. Cortisol 19. Has had periods of increased AF on norepi.  Brady on precedex + neo.  -remove neo from [facility]    -follow BP trend  Paroxysmal A- fib w RVR -cardiac monitoring  -PRN lopressor -No amiodarone due to iodine / contrast allergy   Hypokalemia - resolved Hypophosphatemia - resolved, now hyperphosphatemia Refeeding syndrome -monitor electrolytes and replace aggressively  -goal K >4, Mg >2 with AF / refeeding   Hypothyroidism -synthroid IV   Hypergylcemia -controlled -follow glucose trend  -continue CBG Q4   Acute anemia Suspect multifactorial in setting of operative blood loss and dilutional component  -follow CBC -transfuse for Hgb <7% or active bleeding   Moderate Protein Calorie Malnutrition Refeeding Syndrome -TPN per pharmacy  -replace electrolytes as above    Best practice (evaluated daily)  Diet: NPO DVT prophylaxis: lovenox  GI prophylaxis: protonix Glucose control: SSI Mobility: progressive Last date of multidisciplinary goals of care discussion: Reviewed surgical conversation with pt's nephew from this afternoon.  They have elected to stop current efforts / palliative extubation this evening when all family can arrive.  Family and staff present: Summary of discussion: Follow up goals of care discussion due:  Code Status: DNR   Disposition: ICU  Labs   CBC: Recent Labs  Lab 05/28/20 0257 05/29/20 0416 05/31/20 0610 06/01/20 0300 06/01/20 1346 06/01/20 1618 06/02/20 0200  WBC 14.3*   < > 26.0* 28.4* 27.4* 30.9* 33.8*  NEUTROABS 11.6*  --  21.4*  --  23.8* 26.3* 28.8*  HGB 7.9*   < > 7.8* 6.8* 8.5* 9.3* 9.8*  HCT 23.7*   < > 23.8* 20.9* 26.4* 29.0* 31.5*  MCV 96.3   < > 97.5 99.1 100.8* 97.6 100.3*  PLT 165   < > 209 210 197 216 249   < > = values in this interval  not displayed.    Basic Metabolic Panel: Recent Labs  Lab 05/29/20 2253 05/30/20 0443 05/30/20 2055 05/30/20 2223 05/31/20 0610 06/01/20 0300 06/01/20 1616 06/02/20 0200  NA 140 137   < > 141 140 144 145 147*  K 4.6 4.5   < > 3.9 4.0 3.1* 3.5 4.0  CL 101 97*   < > 97* 100 107 108 112*  CO2 26 26   < > 28 27 25 26 25   GLUCOSE 114* 119*   < > 151* 157* 134* 120* 173*  BUN 30* 34*   < > 47* 57* 58* 54* 53*  CREATININE 1.01* 1.04*   < > 1.15* 1.28* 0.95 0.87 0.67  CALCIUM 8.7* 9.2   < > 9.1 9.2 9.8 9.8 9.8  MG 3.4* 3.1*  --   --  2.7* 2.4  --  2.2  PHOS 4.9* 5.3*  --   --  2.9 2.0* 3.0 4.3   < > = values in this interval not displayed.   GFR: Estimated Creatinine Clearance: 39 mL/min (by C-G formula based on SCr of 0.67 mg/dL). Recent Labs  Lab 06/01/20 0300 06/01/20 1346 06/01/20 1618 06/02/20 0200  WBC 28.4* 27.4* 30.9* 33.8*    Liver Function Tests: Recent Labs  Lab 05/26/20 1957 05/27/20 0319 05/28/20 0257 05/31/20 0610 06/02/20 0200  AST 81* 53* 134* 96* 98*  ALT 32 29 105* 29 42  ALKPHOS 64 56 93 106 109  BILITOT 2.9* 2.2* 1.8* 0.9 0.9  PROT 6.4* 5.7* 5.6* 6.7 6.7  ALBUMIN 3.4* 2.9* 2.6* 2.7* 3.0*   No results for input(s): LIPASE, AMYLASE in the last 168 hours. No results for input(s): AMMONIA in the last 168 hours.  ABG    Component Value Date/Time   PHART 7.232 (L) 06/02/2020 0328   PCO2ART 61.4 (H) 06/02/2020 0328   PO2ART 223 (H) 06/02/2020 0328   HCO3 24.6 06/02/2020 0328   TCO2 23 May 31, 2020 2151   ACIDBASEDEF 2.8 (H) 06/02/2020 0328   O2SAT 98.8 06/02/2020 0328     Coagulation Profile: Recent Labs  Lab 06/02/20 0200  INR 1.1    Cardiac Enzymes: No results for input(s): CKTOTAL, CKMB, CKMBINDEX, TROPONINI in the last 168 hours.  HbA1C: No results found for: HGBA1C  CBG: Recent Labs  Lab 06/01/20 1212 06/01/20 1630 06/01/20 1935 06/01/20 2338 06/02/20 0342  GLUCAP 119* 118* 135* 155* 158*    Critical Care Time: 37  minutes  05/31/2020, MSN, NP-C, AGACNP-BC [facility] 06/02/2020, 8:19 AM   Please see [email or web address] for pager details.

## 2020-06-02 NOTE — Progress Notes (Signed)
Daily Progress Note   Patient Name: Lisa Murray       Date: 06/02/2020 DOB: [birth year]  Age: 86 y.o. MRN#: [id number] Attending Physician: Nolon Nations, MD Primary Care Physician: Jonathon Jordan, MD Admit Date: 06/05/2020  Reason for Consultation/Follow-up: Establishing goals of care and Withdrawal of life-sustaining treatment  Subjective: I was on the floor when family arrived to visit with Lisa Murray prior to extubation.  I met with her nephew, her great nephew, her great niece, and a Theme park manager from their church.  We reviewed again her clinical course and continued decline and the fact that she is approaching end-of-life regardless of interventions moving forward.  Her nephew reports being well updated by both surgery and PCCM and expressed that based upon her decline, her wish would be to focus on comfort and liberation from life support.  We reviewed symptom management and discussed plan for family to visit as long as they would like.  They are saying their goodbyes now and will be leaving prior to her extubation.  Discussed prognosis of hours to likely very limited number of days following extubation.  Length of Stay: 10  Current Medications: Scheduled Meds:  . LORazepam  0.5 mg Intravenous BID  . sodium chloride flush  10-40 mL Intracatheter Q12H    Continuous Infusions: . sodium chloride Stopped (05/31/20 2131)  . sodium chloride    . dextrose 20 mL/hr at 06/02/20 1758  . fentaNYL infusion INTRAVENOUS 75 mcg/hr (06/02/20 1758)    PRN Meds: Place/Maintain arterial line **AND** sodium chloride, acetaminophen, diphenhydrAMINE, fentaNYL, fentaNYL, fentaNYL (SUBLIMAZE) injection, glycopyrrolate **OR** glycopyrrolate **OR** glycopyrrolate, haloperidol lactate, LORazepam, [DISCONTINUED]  ondansetron **OR** ondansetron (ZOFRAN) IV, phenol, polyvinyl alcohol, sodium chloride flush  Physical Exam         General: Unresponsive on vent  HEENT: No bruits, no goiter, no JVD Heart: Tachycardic. No murmur appreciated. Lungs: Vent supported breathing Abdomen: Soft, nontender, nondistended, positive bowel sounds.  Ext: No significant edema Skin: Warm and dry  Vital Signs: BP (!) 94/52   Pulse (!) 103   Temp (!) 101 F (38.3 C) (Axillary)   Resp (!) 38   Ht 5' 2"  (1.575 m)   Wt 51.5 kg   SpO2 96%   BMI 20.77 kg/m  SpO2: SpO2: 96 % O2 Device: O2 Device: Ventilator O2 Flow Rate:  O2 Flow Rate (L/min): 15 L/min  Intake/output summary:   Intake/Output Summary (Last 24 hours) at 06/02/2020 1825 Last data filed at 06/02/2020 1758 Gross per 24 hour  Intake 2281.3 ml  Output 1175 ml  Net 1106.3 ml   LBM: Last BM Date: 06/01/20 Baseline Weight: Weight: 50.8 kg Most recent weight: Weight: 51.5 kg       Palliative Assessment/Data:    Flowsheet Rows   Flowsheet Row Most Recent Value  Intake Tab   Referral Department Surgery  Unit at Time of Referral ICU  Palliative Care Primary Diagnosis Other (Comment)  [GI]  Date Notified 05/31/20  Palliative Care Type New Palliative care  Reason for referral Clarify Goals of Care  Date of Admission 06/05/2020  Date first seen by Palliative Care 06/01/20  # of days Palliative referral response time 1 Day(s)  # of days IP prior to Palliative referral 8  Clinical Assessment   Palliative Performance Scale Score 20%  Psychosocial & Spiritual Assessment   Palliative Care Outcomes   Patient/Family meeting held? Yes  Who was at the meeting? Nephew via phone  Palliative Care Outcomes Clarified goals of care      Patient Active Problem List   Diagnosis Date Noted  . Aspiration into airway   . Acute respiratory failure with hypoxia ([place])   . Atrial fibrillation with rapid ventricular response ([place])   . Pressure injury of skin  05/26/2020  . Malnutrition of moderate degree 05/25/2020  . Small bowel obstruction ([place]) 05/26/2020  . SBO (small bowel obstruction) ([place]) 06/18/2020  . Impaired mobility and ADLs 02/27/2020  . Frequent falls 02/27/2020  . Hypothyroidism 02/27/2020  . Senile osteoporosis 02/27/2020  . Dizziness 02/27/2020  . Slow transit constipation 02/27/2020  . Multiple rib fractures 02/19/2020    Palliative Care Assessment & Plan  Recommendations/Plan:  Plan for one-way extubation and transition to comfort care after family has chance to visit with her.  Discussed with her nephew at bedside and plan is for them to visit for a while and then they are planning on leaving prior to extubation without plan to return after she is extubated.   Family will notify RN at time they are leaving and will proceed with extubation following this.  Goals of Care and Additional Recommendations:  Limitations on Scope of Treatment: Full Comfort Care  Code Status:    Code Status Orders  (From admission, onward)         Start     Ordered   06/02/20 1610  DNR (Do not attempt resuscitation)  Continuous       Question Answer Comment  In the event of cardiac or respiratory ARREST Do not call a "code blue"   In the event of cardiac or respiratory ARREST Do not perform Intubation, CPR, defibrillation or ACLS   In the event of cardiac or respiratory ARREST Use medication by any route, position, wound care, and other measures to relive pain and suffering. May use oxygen, suction and manual treatment of airway obstruction as needed for comfort.      06/02/20 1609        Code Status History    Date Active Date Inactive Code Status Order ID Comments User Context   06/02/2020 1339 06/02/2020 1609 DNR [id number]  Donita Brooks, NP Inpatient   05/31/2020 1244 06/02/2020 1338 Partial Code [id number]  Omar Person, NP Inpatient   05/19/2020 2328 05/31/2020 1244 Full Code [id number]  Leighton Ruff, MD Inpatient    02/27/2020  0602 05/20/2020 1621 Full Code [id number]  Gayland Curry, DO Outpatient   02/19/2020 1406 02/21/2020 1703 Full Code [id number]  [facility]   Advance Care Planning Activity    Advance Directive Documentation   Flowsheet Row Most Recent Value  Type of Advance Directive Healthcare Power of Attorney, Living will  Pre-existing out of facility DNR order (yellow form or pink MOST form) --  "MOST" Form in Place? --       Prognosis:   Hours - Days  Discharge Planning:  Anticipated Hospital Death  Care plan was discussed with surgery, PCCM, bedside RN, and multiple members of family including nephew, great nephew, great niece, and pastor Thank you for allowing the Palliative Medicine Team to assist in the care of this patient.   Time In: 1750 Time Out: 1815 Total Time 25 Prolonged Time Billed No      Greater than 50%  of this time was spent counseling and coordinating care related to the above assessment and plan.  Micheline Rough, MD  Please contact Palliative Medicine Team phone at [phone] for questions and concerns.

## 2020-06-02 NOTE — Progress Notes (Signed)
Pt began to have stridor, E-link was notified. E-link MD wanted to intubate, CRNA came to bedside. She was given 70 mg rocuronium, 2 neo pushes and restarted on the neo gtt for pressure support. Her nephew Trey Paula has been called.

## 2020-06-02 NOTE — Anesthesia Procedure Notes (Signed)
Procedure Name: Intubation Date/Time: 06/02/2020 2:02 AM Performed by: Vanessa Cayuga, CRNA Pre-anesthesia Checklist: Patient identified, Emergency Drugs available, Suction available and Patient being monitored Patient Re-evaluated:Patient Re-evaluated prior to induction Oxygen Delivery Method: Circle system utilized Preoxygenation: Pre-oxygenation with 100% oxygen Induction Type: IV induction Ventilation: Mask ventilation without difficulty Laryngoscope Size: Glidescope and 3 Grade View: Grade I Tube type: Oral Tube size: 7.0 mm Number of attempts: 1 Airway Equipment and Method: Stylet Placement Confirmation: ETT inserted through vocal cords under direct vision,  positive ETCO2 and breath sounds checked- equal and bilateral Secured at: 21 cm Tube secured with: Tape Dental Injury: Teeth and Oropharynx as per pre-operative assessment

## 2020-06-02 NOTE — Progress Notes (Addendum)
eLink Physician-Brief Progress Note Patient Name: Lisa Murray DOB: January 30, 1931 MRN: [id number]   Date of Service  06/02/2020  HPI/Events of Note  Patient now on 100% NRB with sat = 89% and RR = 38-40. Nursing reports now onset of stridor. However, likely d/t retained secretions. Patient is a partial code: Intubation only. Last CXR with LLL atelectasis vs pneumonia.  eICU Interventions  Plan: 1. Will ask anesthesia to intubate. 2. Ventilator settings: 100%/PRVC 14/TV 450/P 8 3. Portable CXR STAT post intubation.  4. ABG 1 hour post intubation.  5. Sedation: Fentanyl 25-50 mcg IV Q 1 hour PRN sedation or agitation.      Intervention Category Major Interventions: Hypoxemia - evaluation and management;Respiratory failure - evaluation and management  Bulmaro Feagans Eugene 06/02/2020, 1:41 AM

## 2020-06-02 NOTE — Progress Notes (Signed)
10 Days Post-Op  Subjective: Patient continues to decline.  reintubated over night.  WBC continues to increase and she continues to decline.  CT of abd/pel and head yesterday unremarkable  ROS: See above, otherwise other systems negative  Objective: Vital signs in last 24 hours: Temp:  [97.8 F (36.6 C)-100 F (37.8 C)] 97.8 F (36.6 C) (12/15 0800) Pulse Rate:  [67-116] 105 (12/15 0900) Resp:  [14-44] 33 (12/15 0900) BP: (84-150)/(36-73) 100/52 (12/15 0900) SpO2:  [79 %-100 %] 95 % (12/15 0900) FiO2 (%):  [70 %-100 %] 70 % (12/15 0813) Last BM Date: 06/01/20  Intake/Output from previous day: 12/14 0701 - 12/15 0700 In: 2597 [I.V.:1891.8; Blood:505.2; IV Piggyback:200] Out: 2225 [Urine:1775; Emesis/NG output:450] Intake/Output this shift: Total I/O In: 71.6 [I.V.:71.6] Out: -   PE: Gen: frail ill-appearing elderly female Lungs: reintubated overnight Abd: soft, +BS, midline incision is c/d/i with staples present.  NGT in place with  Milky white output, likely from contrast from her scan yesterday.  Lab Results:  Recent Labs    06/01/20 1618 06/02/20 0200  WBC 30.9* 33.8*  HGB 9.3* 9.8*  HCT 29.0* 31.5*  PLT 216 249   BMET Recent Labs    06/01/20 1616 06/02/20 0200  NA 145 147*  K 3.5 4.0  CL 108 112*  CO2 26 25  GLUCOSE 120* 173*  BUN 54* 53*  CREATININE 0.87 0.67  CALCIUM 9.8 9.8   PT/INR Recent Labs    06/02/20 0200  LABPROT 13.6  INR 1.1   CMP     Component Value Date/Time   NA 147 (H) 06/02/2020 0200   NA 141 03/01/2020 0000   K 4.0 06/02/2020 0200   CL 112 (H) 06/02/2020 0200   CO2 25 06/02/2020 0200   GLUCOSE 173 (H) 06/02/2020 0200   BUN 53 (H) 06/02/2020 0200   BUN 10 03/01/2020 0000   CREATININE 0.67 06/02/2020 0200   CALCIUM 9.8 06/02/2020 0200   PROT 6.7 06/02/2020 0200   ALBUMIN 3.0 (L) 06/02/2020 0200   AST 98 (H) 06/02/2020 0200   ALT 42 06/02/2020 0200   ALKPHOS 109 06/02/2020 0200   BILITOT 0.9 06/02/2020 0200    GFRNONAA >60 06/02/2020 0200   GFRAA  05/30/2020 2055    QUESTIONABLE RESULTS, RECOMMEND RECOLLECT TO VERIFY   Lipase     Component Value Date/Time   LIPASE 21 06/14/2020 1721       Studies/Results: CT ABDOMEN PELVIS WO CONTRAST  Result Date: 06/01/2020 CLINICAL DATA:  Postoperative fever and elevated white blood cell count. Patient is 9 days post closed loop obstruction. EXAM: CT ABDOMEN AND PELVIS WITHOUT CONTRAST TECHNIQUE: Multidetector CT imaging of the abdomen and pelvis was performed following the standard protocol without IV contrast. COMPARISON:  CT scan 05/30/2020 FINDINGS: Lower chest: Stable large hiatal her with the left upper quadrant abdominal contents up in the chest. There is a small right pleural effusion and overlying atelectasis. Stable aortic and coronary artery calcifications. Hepatobiliary: Stable right hepatic lobe cyst. The gallbladder is moderately distended and appears to contain layering sludge or gallstones. No obvious CT findings for acute cholecystitis. No common bile duct dilatation. Pancreas: No mass, inflammation or ductal dilatation. Spleen: Normal size.  No focal lesions. Adrenals/Urinary Tract: The adrenal glands and kidneys are unremarkable. The bladder contains a Foley catheter. Stomach/Bowel: Stomach, duodenum, small bowel and colon are unremarkable. No evidence of obstruction perforation. No leaking oral contrast or free air. Contrast gets all the way to the rectum.  Vascular/Lymphatic: Stable aortic calcifications and tortuosity. Reproductive: The uterus is surgically absent. I believe both ovaries are still present appear normal. Other: No abdominal or pelvic abscess is identified. Musculoskeletal: No significant bony findings. Stable L1 and L2 fractures. IMPRESSION: 1. No findings for small bowel obstruction or perforation. No intra-abdominal/intrapelvic abscess. 2. Stable large hiatal hernia. 3. Small right pleural effusion and overlying atelectasis. 4.  Distended gallbladder with layering sludge or gallstones. No obvious CT findings for acute cholecystitis. 5. Stable L1 and L2 fractures. 6. Aortic atherosclerosis. Aortic Atherosclerosis (ICD10-I70.0). Electronically Signed   By: Rudie MeyerP.  Gallerani M.D.   On: 06/01/2020 19:28   DG Abd 1 View  Result Date: 06/01/2020 CLINICAL DATA:  NG tube placement. EXAM: ABDOMEN - 1 VIEW COMPARISON:  06/01/2020, earlier the same day.  CT scan 06/17/2020. FINDINGS: The tip of the NG tube projects in the medial inferior left hemithorax, superimposed on the gastric bubble of a large known left hemithoracic hiatal hernia. Bowel gas pattern is nonspecific. Bones are demineralized. IMPRESSION: NG tube tip position consistent with placement in the large hiatal hernia. Placement in a posterior left lower lobe airway is considered less likely but cannot be excluded on this single frontal projection. Electronically Signed   By: Kennith CenterEric  Mansell M.D.   On: 06/01/2020 09:16   CT HEAD WO CONTRAST  Result Date: 06/01/2020 CLINICAL DATA:  Mental status change, unknown cause EXAM: CT HEAD WITHOUT CONTRAST TECHNIQUE: Contiguous axial images were obtained from the base of the skull through the vertex without intravenous contrast. COMPARISON:  02/19/2020. FINDINGS: Brain: No acute infarct or intracranial hemorrhage. No mass lesion. No midline shift, ventriculomegaly or extra-axial fluid collection. Chronic right thalamic lacunar insult. Mild cerebral atrophy with ex vacuo dilatation. Chronic microvascular ischemic changes. Vascular: No hyperdense vessel or unexpected calcification. Bilateral skull base atherosclerotic calcifications. Skull: Negative for fracture or focal lesion. Sinuses/Orbits: Normal orbits. Mild pansinus disease with layering secretions. Trace bilateral mastoid free fluid. Other: None. IMPRESSION: No acute intracranial process. Chronic right thalamic lacunar insult. Electronically Signed   By: Stana Buntinghikanele  Emekauwa M.D.   On:  06/01/2020 16:26   DG CHEST PORT 1 VIEW  Result Date: 06/02/2020 CLINICAL DATA:  Post intubation EXAM: PORTABLE CHEST 1 VIEW COMPARISON:  06/01/2020 FINDINGS: Endotracheal tube is 3 cm above the carina. NG tube tip is in the distal esophagus or just into the large hiatal hernia. Left PICC line in place with the tip in the SVC. Heart is normal size. Bilateral patchy airspace opacities, most pronounced in the lower lobes concerning for pneumonia. No effusions or acute bony abnormality. IMPRESSION: Endotracheal tube 3 cm above the carina. NG tube tip in the distal esophagus or just into the hiatal hernia. Patchy bilateral lower lobe opacities concerning for pneumonia. Electronically Signed   By: Charlett NoseKevin  Dover M.D.   On: 06/02/2020 02:17   DG CHEST PORT 1 VIEW  Result Date: 06/01/2020 CLINICAL DATA:  Bowel obstruction, vomiting, decreased consciousness and possible aspiration. EXAM: PORTABLE CHEST 1 VIEW COMPARISON:  Chest x-ray earlier today at 0447 hours FINDINGS: Stable appearance of left subclavian central venous catheter with catheter tip in the lower SVC. Interval placement of gastric decompression tube that extends just into a distended stomach which occupies the left lower hemithorax. Based on chest x-ray appearance it is unclear whether much of this represents a hiatal hernia or elevated left hemidiaphragm with high positioning of the stomach. Compressed lung at the left lung base again demonstrates increased density and may be involved with pneumonia.  No edema, pneumothorax or pleural fluid identified. IMPRESSION: Interval placement of gastric decompression tube that extends just into a distended stomach which occupies the left lower hemithorax. Based on chest x-ray appearance it is unclear whether much of this represents a hiatal hernia or elevated left hemidiaphragm with high positioning of the stomach. Left lower lung atelectasis versus pneumonia. Electronically Signed   By: Irish Lack M.D.    On: 06/01/2020 13:09   DG CHEST PORT 1 VIEW  Result Date: 06/01/2020 CLINICAL DATA:  Aspiration. EXAM: PORTABLE CHEST 1 VIEW COMPARISON:  05/30/2020. FINDINGS: Left PICC line in stable position. Heart size stable. Low lung volumes. Persistent infiltrate left upper lung and left base. Persistent left-sided pleural effusion. No interim change. No pneumothorax. Degenerative changes thoracic spine and right shoulder. Surgical clips noted over the right neck. IMPRESSION: 1. Left PICC line stable position. 2. Low lung volumes. Persistent infiltrate left upper lung and left base. Persistent left-sided pleural effusion. No interim change. Electronically Signed   By: Maisie Fus  Register   On: 06/01/2020 05:47   DG Abd Portable 1V  Result Date: 06/01/2020 CLINICAL DATA:  NG tube placement EXAM: PORTABLE ABDOMEN - 1 VIEW COMPARISON:  05/27/2020 FINDINGS: NG tube coils in the large hiatal hernia in the left lower chest. IMPRESSION: NG tube coils in the large hiatal hernia. Electronically Signed   By: Charlett Nose M.D.   On: 06/01/2020 01:09    Anti-infectives: Anti-infectives (From admission, onward)   Start     Dose/Rate Route Frequency Ordered Stop   06/01/20 1400  ceFAZolin (ANCEF) IVPB 2g/100 mL premix  Status:  Discontinued        2 g 200 mL/hr over 30 Minutes Intravenous Every 8 hours 06/01/20 0958 06/01/20 1245   06/01/20 1345  ceFEPIme (MAXIPIME) 2 g in sodium chloride 0.9 % 100 mL IVPB        2 g 200 mL/hr over 30 Minutes Intravenous Every 12 hours 06/01/20 1245     05/31/20 2200  ceFAZolin (ANCEF) IVPB 2g/100 mL premix  Status:  Discontinued        2 g 200 mL/hr over 30 Minutes Intravenous Every 12 hours 05/31/20 0837 06/01/20 0958   05/29/20 1800  ceFAZolin (ANCEF) IVPB 2g/100 mL premix  Status:  Discontinued        2 g 200 mL/hr over 30 Minutes Intravenous Every 12 hours 05/29/20 0951 05/29/20 0954   05/29/20 1800  ceFAZolin (ANCEF) IVPB 2g/100 mL premix  Status:  Discontinued        2  g 200 mL/hr over 30 Minutes Intravenous Every 12 hours 05/29/20 0954 05/29/20 1512   05/29/20 1600  ceFAZolin (ANCEF) IVPB 2g/100 mL premix  Status:  Discontinued        2 g 200 mL/hr over 30 Minutes Intravenous Every 8 hours 05/29/20 1512 05/31/20 0837   05/27/20 2000  vancomycin (VANCOREADY) IVPB 500 mg/100 mL  Status:  Discontinued        500 mg 100 mL/hr over 60 Minutes Intravenous Every 24 hours 05/26/20 1800 05/29/20 0951   05/26/20 2000  vancomycin (VANCOCIN) IVPB 1000 mg/200 mL premix        1,000 mg 200 mL/hr over 60 Minutes Intravenous  Once 05/26/20 1752 05/26/20 2245   05/26/20 1830  ceFEPIme (MAXIPIME) 2 g in sodium chloride 0.9 % 100 mL IVPB  Status:  Discontinued        2 g 200 mL/hr over 30 Minutes Intravenous Every 12 hours 05/26/20 1750 05/29/20 0944  06/05/2020 2100  cefoTEtan (CEFOTAN) 1 g in sodium chloride 0.9 % 100 mL IVPB        1 g 200 mL/hr over 30 Minutes Intravenous On call to O.R. 06/10/2020 2008 05/25/2020 2112       Assessment/Plan POD 10, s/p ex lap with SBR secondary to adhesive band with closed loop SBO, Dr. Maisie Fus on 05/21/2020 -NGT remains in place.   -CT scan with no acute post operative findings -WBC up to 33K, suspect secondary to lungs -patient told surgeon preop to do what she could but if it was her time, she was ok with this.  Will try to call the nephew today for further GOC discussions as this patient's prognosis is grim. -appreciate palliative care assistance  Lactic acidosis 2/2 ischemic bowel - Cleared on follow up lactic acid level 12/6  Acute blood loss anemia combined with dilutional anemia - HGBup to 9.8 today after transfusion yesterday  Non-severe moderate degree of malnutrition, present on admission -Continue TPN till tolerating nutritional requirements PO  Post-extubation stridor/VDRF/PNA -extubated on 12/12 -reintubated overnight. -abx per CCM for PNA  Atrial fibrillation with rapid ventricular response - Appreciate  critical care team's assistance  -on lovenox, diltiazem  Hypothyroidism - home medications  Hyperglycemia due to acute illness - SSI  FEN: IVF, TNA ID: [email or web address] per critical care VTE: SCDs, 50 lovenox for a fib Foley: Continue Dispo: Continued ICU care, partial DNR   LOS: 10 days    Letha Cape , [facility] 06/02/2020, 10:03 AM Please see Amion for pager number during day hours 7:00am-4:30pm or 7:00am -11:30am on weekends

## 2020-06-02 NOTE — Progress Notes (Signed)
ANTICOAGULATION CONSULT NOTE - Initial Consult  Pharmacy Consult for LMWH Indication: atrial fibrillation  Allergies  Allergen Reactions  . Iodine Hives  . Codeine Nausea And Vomiting  . Contrast Media [Iodinated Diagnostic Agents]   . Doxycycline Nausea And Vomiting  . Fosamax [Alendronate] Nausea And Vomiting  . Macrobid [Nitrofurantoin] Nausea And Vomiting  . Tramadol Nausea And Vomiting  . Erythromycin Rash  . Morphine And Related Anxiety  . Novocain [Procaine] Anxiety  . Penicillin G Benzathine Rash  . Sulfa Antibiotics Rash    Patient Measurements: Height: 5\' 2"  (157.5 cm) Weight: 49.9 kg (110 lb 0.2 oz) IBW/kg (Calculated) : 50.1 Heparin Dosing Weight:   Vital Signs: Temp: 98.1 F (36.7 C) (12/15 0545) Temp Source: Oral (12/15 0545) BP: 127/69 (12/15 0733) Pulse Rate: 104 (12/15 0813)  Labs: Recent Labs    06/01/20 0300 06/01/20 1346 06/01/20 1616 06/01/20 1618 06/02/20 0200  HGB 6.8* 8.5*  --  9.3* 9.8*  HCT 20.9* 26.4*  --  29.0* 31.5*  PLT 210 197  --  216 249  LABPROT  --   --   --   --  13.6  INR  --   --   --   --  1.1  CREATININE 0.95  --  0.87  --  0.67    Estimated Creatinine Clearance: 39 mL/min (by C-G formula based on SCr of 0.67 mg/dL).   Medical History: Past Medical History:  Diagnosis Date  . Dizziness 02/27/2020  . Hypothyroidism 02/27/2020  . Senile osteoporosis 02/27/2020    Medications:  Medications Prior to Admission  Medication Sig Dispense Refill Last Dose  . acetaminophen (TYLENOL) 650 MG CR tablet Take 650 mg by mouth every 6 (six) hours as needed for pain.     04/28/2020 ALPRAZolam (XANAX) 0.25 MG tablet Take 1-2 tablets (0.25-0.5 mg total) by mouth See admin instructions. Take 1 tablet (0.25 mg) in the morning and take 2 tablet (0.50mg )at night 30 tablet 0   . amLODipine (NORVASC) 2.5 MG tablet Take 1 tablet (2.5 mg total) by mouth daily. 30 tablet 0   . Azelastine HCl 0.15 % SOLN Place 2 sprays into both nostrils daily. 30 mL 0    . bisacodyl (DULCOLAX) 10 MG suppository Place 10 mg rectally as needed for moderate constipation.     Marland Kitchen buPROPion (WELLBUTRIN SR) 150 MG 12 hr tablet Take 1 tablet (150 mg total) by mouth 2 (two) times daily. 60 tablet 0   . Calcium Carbonate-Vitamin D (OSCAL 500/200 D-3 PO) Take 1 tablet by mouth daily.     Marland Kitchen denosumab (PROLIA) 60 MG/ML SOSY injection Inject 60 mg into the skin every 6 (six) months.     . docusate sodium (COLACE) 100 MG capsule Take 1 capsule (100 mg total) by mouth 2 (two) times daily. 14 capsule 0   . esomeprazole (NEXIUM) 20 MG capsule Take 20 mg by mouth daily at 12 noon.     Marland Kitchen guaiFENesin (MUCINEX) 600 MG 12 hr tablet Take by mouth 2 (two) times daily. 1 tablet by mouth every 12 hours as needed for mucous secretions.     . Magnesium Hydroxide (MILK OF MAGNESIA PO) Take 30 mLs by mouth as needed.     . Menthol, Topical Analgesic, (BIOFREEZE) 4 % GEL Apply topically.     . mirtazapine (REMERON SOL-TAB) 30 MG disintegrating tablet Take 1 tablet (30 mg total) by mouth at bedtime. 30 tablet 0   . Multiple Vitamin (MULTIVITAMIN WITH MINERALS) TABS tablet  Take 1 tablet by mouth daily.     . Omega-3 Fatty Acids (FISH OIL PO) Take 1 capsule by mouth daily.     . polyethylene glycol (MIRALAX) 17 g packet Take 17 g by mouth daily as needed. 14 each 0   . Psyllium (METAMUCIL FIBER PO) Take by mouth. Mix 1 packet with 8 oz of water, and drink daily for constipation. Hold for loose stool     . rosuvastatin (CRESTOR) 10 MG tablet Take 1 tablet (10 mg total) by mouth daily. 30 tablet 0   . Sodium Phosphates (RA SALINE ENEMA RE) Place rectally as needed.     Marland Kitchen SYNTHROID 88 MCG tablet Take 1 tablet (88 mcg total) by mouth every morning. 30 tablet 0   . VITAMIN D PO Take 2 capsules by mouth daily.       Assessment: 86 yo M on LMWH full dose for afib since 05/30/20  06/02/2020  INR 1.1, Hg up to 8.9 after 1 U PRBC 12/14.  PLTC WNL< SCr down to 0.67. CrCl ~ 39 ml/min  Goal of  Therapy:  Anti-Xa level 0.6-1 units/ml 4hrs after LMWH dose given Monitor platelets by anticoagulation protocol: Yes   Plan:  Increase LMWH to 50 q12 Monitor CBC & renal function  Herby Abraham, Pharm.D 06/02/2020 8:47 AM

## 2020-06-02 NOTE — Plan of Care (Signed)
  Problem: Pain Managment: Goal: General experience of comfort will improve Outcome: Progressing   Problem: Activity: Goal: Ability to tolerate increased activity will improve Outcome: Not Applicable   Problem: Respiratory: Goal: Ability to maintain a clear airway and adequate ventilation will improve Outcome: Not Applicable   Problem: Role Relationship: Goal: Method of communication will improve Outcome: Not Applicable

## 2020-06-02 NOTE — Progress Notes (Signed)
eLink Physician-Brief Progress Note Patient Name: Lisa Murray DOB: 19-Jul-1930 MRN: [id number]   Date of Service  06/02/2020  HPI/Events of Note  ABG on 100%/PRVC 14/TV 450/P 8 = 7.232/61.4/223.  eICU Interventions  Plan: 1. Increase PRVC rate to 21. 2. Repeat ABG at 7:30 AM.     Intervention Category Major Interventions: Respiratory failure - evaluation and management;Acid-Base disturbance - evaluation and management  Rangel Echeverri Eugene 06/02/2020, 4:33 AM

## 2020-06-03 NOTE — Progress Notes (Addendum)
Patient transitioned to comfort care 12/15 pm.  She appears comfortable on current medication regimen.  Reviewed with CCS.  PCCM will be available PRN.  Please call back if new needs arise.     Canary Brim, MSN, NP-C, AGACNP-BC [facility] 06/03/2020, 8:22 AM   Please see [email or web address] for pager details.

## 2020-06-03 NOTE — Progress Notes (Signed)
Daily Progress Note   Patient Name: Lisa Murray       Date: 06/03/2020 DOB: 04/08/1931  Age: 86 y.o. MRN#: [id number] Attending Physician: Lisa Limbo, MD Primary Care Physician: Lisa Palmer, MD Admit Date: 05/27/2020  Reason for Consultation/Follow-up: Establishing goals of care and Withdrawal of life-sustaining treatment  Subjective: I saw and examined Lisa Murray.  She is actively dying in no distress.  Respirations unlabored.  I called and spoke with her nephew.  Discussed plan for continuation of comfort measures.  Length of Stay: 11  Current Medications: Scheduled Meds:    Continuous Infusions:  sodium chloride Stopped (05/31/20 2131)   dextrose 20 mL/hr at 06/03/20 0403   fentaNYL infusion INTRAVENOUS 150 mcg/hr (06/03/20 0715)    PRN Meds: acetaminophen, diphenhydrAMINE, fentaNYL, glycopyrrolate **OR** glycopyrrolate **OR** glycopyrrolate, haloperidol lactate, [DISCONTINUED] ondansetron **OR** ondansetron (ZOFRAN) IV, polyvinyl alcohol  Physical Exam         General: Somnolent. HEENT: No bruits, no goiter, no JVD Heart: Regular.  No murmur. Lungs: Decreased air movement, some periods of apnea noted. Abdomen: Soft, nontender, nondistended, positive bowel sounds.  Ext: No significant edema Skin: Warm and dry  Vital Signs: BP (!) 114/58 (BP Location: Right Arm)    Pulse 66    Temp 97.8 F (36.6 C) (Oral)    Resp 14    Ht 5\' 2"  (1.575 m)    Wt 51.5 kg    SpO2 93%    BMI 20.77 kg/m  SpO2: SpO2: 93 % O2 Device: O2 Device: Room Air O2 Flow Rate: O2 Flow Rate (L/min): 15 L/min  Intake/output summary:   Intake/Output Summary (Last 24 hours) at 06/03/2020 1803 Last data filed at 06/03/2020 1542 Gross per 24 hour  Intake 349.24 ml  Output 1650 ml  Net -1300.76  ml   LBM: Last BM Date: 06/02/20 Baseline Weight: Weight: 50.8 kg Most recent weight: Weight: 51.5 kg       Palliative Assessment/Data:    Flowsheet Rows   Flowsheet Row Most Recent Value  Intake Tab   Referral Department Surgery  Unit at Time of Referral ICU  Palliative Care Primary Diagnosis Other (Comment)  [GI]  Date Notified 05/31/20  Palliative Care Type New Palliative care  Reason for referral Clarify Goals of Care  Date of Admission 06/05/2020  Date first seen  by Palliative Care 06/01/20  # of days Palliative referral response time 1 Day(s)  # of days IP prior to Palliative referral 8  Clinical Assessment   Palliative Performance Scale Score 20%  Psychosocial & Spiritual Assessment   Palliative Care Outcomes   Patient/Family meeting held? Yes  Who was at the meeting? Nephew via phone  Palliative Care Outcomes Clarified goals of care      Patient Active Problem List   Diagnosis Date Noted   Aspiration into airway    Acute respiratory failure with hypoxia (HCC)    Atrial fibrillation with rapid ventricular response (HCC)    Pressure injury of skin 05/26/2020   Malnutrition of moderate degree 05/25/2020   Small bowel obstruction (HCC) 2020/06/22   SBO (small bowel obstruction) (HCC) 2020-06-22   Impaired mobility and ADLs 02/27/2020   Frequent falls 02/27/2020   Hypothyroidism 02/27/2020   Senile osteoporosis 02/27/2020   Dizziness 02/27/2020   Slow transit constipation 02/27/2020   Multiple rib fractures 02/19/2020    Palliative Care Assessment & Plan  Recommendations/Plan:  Lisa Murray is actively dying.  Plan moving forward is for full comfort.  Prognosis at this point is likely hours to days.  Continue same.  I called and updated her nephew.  He denies any needs or questions.  Goals of Care and Additional Recommendations:  Limitations on Scope of Treatment: Full Comfort Care  Code Status:    Code Status Orders  (From admission,  onward)         Start     Ordered   06/02/20 1610  DNR (Do not attempt resuscitation)  Continuous       Question Answer Comment  In the event of cardiac or respiratory ARREST Do not call a code blue   In the event of cardiac or respiratory ARREST Do not perform Intubation, CPR, defibrillation or ACLS   In the event of cardiac or respiratory ARREST Use medication by any route, position, wound care, and other measures to relive pain and suffering. May use oxygen, suction and manual treatment of airway obstruction as needed for comfort.      06/02/20 1609        Code Status History    Date Active Date Inactive Code Status Order ID Comments User Context   06/02/2020 1339 06/02/2020 1609 DNR [id number]  Jeanella Craze, NP Inpatient   05/31/2020 1244 06/02/2020 1338 Partial Code [id number]  Tobey Grim, NP Inpatient   June 22, 2020 2328 05/31/2020 1244 Full Code [id number]  Romie Levee, MD Inpatient   02/27/2020 0602 06-22-2020 1621 Full Code [id number]  Kermit Balo, DO Outpatient   02/19/2020 1406 02/21/2020 1703 Full Code [id number]  [facility]   Advance Care Planning Activity    Advance Directive Documentation   Flowsheet Row Most Recent Value  Type of Advance Directive Healthcare Power of Attorney, Living will  Pre-existing out of facility DNR order (yellow form or pink MOST form) --  "MOST" Form in Place? --       Prognosis:   Hours - Days  Discharge Planning:  Anticipated Hospital Death  Care plan was discussed with nephew via phone Thank you for allowing the Palliative Medicine Team to assist in the care of this patient.   Time In: 1600 Time Out: 1615 Total Time 15 Prolonged Time Billed No   Greater than 50%  of this time was spent counseling and coordinating care related to the above assessment and plan.  Romie Minus, MD  Please contact Palliative Medicine Team phone at [phone] for questions and concerns.  ° ° ° ° °

## 2020-06-03 NOTE — Progress Notes (Signed)
11 Days Post-Op  Subjective: Patient terminally extubated last night.  She appears very comfortable this morning.  No agitation and is otherwise sedated and does not respond to voice or touch.    ROS: See above, otherwise other systems negative  Objective: Vital signs in last 24 hours: Temp:  [97.9 F (36.6 C)-101 F (38.3 C)] 101 F (38.3 C) (12/15 1600) Pulse Rate:  [59-128] 59 (12/16 0808) Resp:  [12-39] 12 (12/16 0808) BP: (79-119)/(39-74) 89/52 (12/16 0808) SpO2:  [88 %-100 %] 93 % (12/16 0808) FiO2 (%):  [40 %-50 %] 40 % (12/15 1533) Weight:  [51.5 kg] 51.5 kg (12/15 1147) Last BM Date: 06/02/20  Intake/Output from previous day: 12/15 0701 - 12/16 0700 In: 1401.1 [I.V.:1301.1; IV Piggyback:100] Out: 625 [Urine:525; Emesis/NG output:100] Intake/Output this shift: No intake/output data recorded.  PE: Gen: very comfortable Heart: regular Lungs: CTAB, diminished and very purposeful deep breathes with some pausing in between  Abd: stable, midline c/d/i with staples  Lab Results:  Recent Labs    06/01/20 1618 06/02/20 0200  WBC 30.9* 33.8*  HGB 9.3* 9.8*  HCT 29.0* 31.5*  PLT 216 249   BMET Recent Labs    06/01/20 1616 06/02/20 0200  NA 145 147*  K 3.5 4.0  CL 108 112*  CO2 26 25  GLUCOSE 120* 173*  BUN 54* 53*  CREATININE 0.87 0.67  CALCIUM 9.8 9.8   PT/INR Recent Labs    06/02/20 0200  LABPROT 13.6  INR 1.1   CMP     Component Value Date/Time   NA 147 (H) 06/02/2020 0200   NA 141 03/01/2020 0000   K 4.0 06/02/2020 0200   CL 112 (H) 06/02/2020 0200   CO2 25 06/02/2020 0200   GLUCOSE 173 (H) 06/02/2020 0200   BUN 53 (H) 06/02/2020 0200   BUN 10 03/01/2020 0000   CREATININE 0.67 06/02/2020 0200   CALCIUM 9.8 06/02/2020 0200   PROT 6.7 06/02/2020 0200   ALBUMIN 3.0 (L) 06/02/2020 0200   AST 98 (H) 06/02/2020 0200   ALT 42 06/02/2020 0200   ALKPHOS 109 06/02/2020 0200   BILITOT 0.9 06/02/2020 0200   GFRNONAA >60 06/02/2020 0200    GFRAA  05/30/2020 2055    QUESTIONABLE RESULTS, RECOMMEND RECOLLECT TO VERIFY   Lipase     Component Value Date/Time   LIPASE 21 05/31/2020 1721       Studies/Results: CT ABDOMEN PELVIS WO CONTRAST  Result Date: 06/01/2020 CLINICAL DATA:  Postoperative fever and elevated white blood cell count. Patient is 9 days post closed loop obstruction. EXAM: CT ABDOMEN AND PELVIS WITHOUT CONTRAST TECHNIQUE: Multidetector CT imaging of the abdomen and pelvis was performed following the standard protocol without IV contrast. COMPARISON:  CT scan 06/10/2020 FINDINGS: Lower chest: Stable large hiatal her with the left upper quadrant abdominal contents up in the chest. There is a small right pleural effusion and overlying atelectasis. Stable aortic and coronary artery calcifications. Hepatobiliary: Stable right hepatic lobe cyst. The gallbladder is moderately distended and appears to contain layering sludge or gallstones. No obvious CT findings for acute cholecystitis. No common bile duct dilatation. Pancreas: No mass, inflammation or ductal dilatation. Spleen: Normal size.  No focal lesions. Adrenals/Urinary Tract: The adrenal glands and kidneys are unremarkable. The bladder contains a Foley catheter. Stomach/Bowel: Stomach, duodenum, small bowel and colon are unremarkable. No evidence of obstruction perforation. No leaking oral contrast or free air. Contrast gets all the way to the rectum. Vascular/Lymphatic: Stable aortic  calcifications and tortuosity. Reproductive: The uterus is surgically absent. I believe both ovaries are still present appear normal. Other: No abdominal or pelvic abscess is identified. Musculoskeletal: No significant bony findings. Stable L1 and L2 fractures. IMPRESSION: 1. No findings for small bowel obstruction or perforation. No intra-abdominal/intrapelvic abscess. 2. Stable large hiatal hernia. 3. Small right pleural effusion and overlying atelectasis. 4. Distended gallbladder with  layering sludge or gallstones. No obvious CT findings for acute cholecystitis. 5. Stable L1 and L2 fractures. 6. Aortic atherosclerosis. Aortic Atherosclerosis (ICD10-I70.0). Electronically Signed   By: Rudie Meyer M.D.   On: 06/01/2020 19:28   DG Abd 1 View  Result Date: 06/01/2020 CLINICAL DATA:  NG tube placement. EXAM: ABDOMEN - 1 VIEW COMPARISON:  06/01/2020, earlier the same day.  CT scan 05/26/2020. FINDINGS: The tip of the NG tube projects in the medial inferior left hemithorax, superimposed on the gastric bubble of a large known left hemithoracic hiatal hernia. Bowel gas pattern is nonspecific. Bones are demineralized. IMPRESSION: NG tube tip position consistent with placement in the large hiatal hernia. Placement in a posterior left lower lobe airway is considered less likely but cannot be excluded on this single frontal projection. Electronically Signed   By: Kennith Center M.D.   On: 06/01/2020 09:16   CT HEAD WO CONTRAST  Result Date: 06/01/2020 CLINICAL DATA:  Mental status change, unknown cause EXAM: CT HEAD WITHOUT CONTRAST TECHNIQUE: Contiguous axial images were obtained from the base of the skull through the vertex without intravenous contrast. COMPARISON:  02/19/2020. FINDINGS: Brain: No acute infarct or intracranial hemorrhage. No mass lesion. No midline shift, ventriculomegaly or extra-axial fluid collection. Chronic right thalamic lacunar insult. Mild cerebral atrophy with ex vacuo dilatation. Chronic microvascular ischemic changes. Vascular: No hyperdense vessel or unexpected calcification. Bilateral skull base atherosclerotic calcifications. Skull: Negative for fracture or focal lesion. Sinuses/Orbits: Normal orbits. Mild pansinus disease with layering secretions. Trace bilateral mastoid free fluid. Other: None. IMPRESSION: No acute intracranial process. Chronic right thalamic lacunar insult. Electronically Signed   By: Stana Bunting M.D.   On: 06/01/2020 16:26   DG CHEST  PORT 1 VIEW  Result Date: 06/02/2020 CLINICAL DATA:  Post intubation EXAM: PORTABLE CHEST 1 VIEW COMPARISON:  06/01/2020 FINDINGS: Endotracheal tube is 3 cm above the carina. NG tube tip is in the distal esophagus or just into the large hiatal hernia. Left PICC line in place with the tip in the SVC. Heart is normal size. Bilateral patchy airspace opacities, most pronounced in the lower lobes concerning for pneumonia. No effusions or acute bony abnormality. IMPRESSION: Endotracheal tube 3 cm above the carina. NG tube tip in the distal esophagus or just into the hiatal hernia. Patchy bilateral lower lobe opacities concerning for pneumonia. Electronically Signed   By: Charlett Nose M.D.   On: 06/02/2020 02:17   DG CHEST PORT 1 VIEW  Result Date: 06/01/2020 CLINICAL DATA:  Bowel obstruction, vomiting, decreased consciousness and possible aspiration. EXAM: PORTABLE CHEST 1 VIEW COMPARISON:  Chest x-ray earlier today at 0447 hours FINDINGS: Stable appearance of left subclavian central venous catheter with catheter tip in the lower SVC. Interval placement of gastric decompression tube that extends just into a distended stomach which occupies the left lower hemithorax. Based on chest x-ray appearance it is unclear whether much of this represents a hiatal hernia or elevated left hemidiaphragm with high positioning of the stomach. Compressed lung at the left lung base again demonstrates increased density and may be involved with pneumonia. No edema, pneumothorax  or pleural fluid identified. IMPRESSION: Interval placement of gastric decompression tube that extends just into a distended stomach which occupies the left lower hemithorax. Based on chest x-ray appearance it is unclear whether much of this represents a hiatal hernia or elevated left hemidiaphragm with high positioning of the stomach. Left lower lung atelectasis versus pneumonia. Electronically Signed   By: Irish Lack M.D.   On: 06/01/2020 13:09     Anti-infectives: Anti-infectives (From admission, onward)   Start     Dose/Rate Route Frequency Ordered Stop   06/01/20 1400  ceFAZolin (ANCEF) IVPB 2g/100 mL premix  Status:  Discontinued        2 g 200 mL/hr over 30 Minutes Intravenous Every 8 hours 06/01/20 0958 06/01/20 1245   06/01/20 1345  ceFEPIme (MAXIPIME) 2 g in sodium chloride 0.9 % 100 mL IVPB  Status:  Discontinued        2 g 200 mL/hr over 30 Minutes Intravenous Every 12 hours 06/01/20 1245 06/02/20 1757   05/31/20 2200  ceFAZolin (ANCEF) IVPB 2g/100 mL premix  Status:  Discontinued        2 g 200 mL/hr over 30 Minutes Intravenous Every 12 hours 05/31/20 0837 06/01/20 0958   05/29/20 1800  ceFAZolin (ANCEF) IVPB 2g/100 mL premix  Status:  Discontinued        2 g 200 mL/hr over 30 Minutes Intravenous Every 12 hours 05/29/20 0951 05/29/20 0954   05/29/20 1800  ceFAZolin (ANCEF) IVPB 2g/100 mL premix  Status:  Discontinued        2 g 200 mL/hr over 30 Minutes Intravenous Every 12 hours 05/29/20 0954 05/29/20 1512   05/29/20 1600  ceFAZolin (ANCEF) IVPB 2g/100 mL premix  Status:  Discontinued        2 g 200 mL/hr over 30 Minutes Intravenous Every 8 hours 05/29/20 1512 05/31/20 0837   05/27/20 2000  vancomycin (VANCOREADY) IVPB 500 mg/100 mL  Status:  Discontinued        500 mg 100 mL/hr over 60 Minutes Intravenous Every 24 hours 05/26/20 1800 05/29/20 0951   05/26/20 2000  vancomycin (VANCOCIN) IVPB 1000 mg/200 mL premix        1,000 mg 200 mL/hr over 60 Minutes Intravenous  Once 05/26/20 1752 05/26/20 2245   05/26/20 1830  ceFEPIme (MAXIPIME) 2 g in sodium chloride 0.9 % 100 mL IVPB  Status:  Discontinued        2 g 200 mL/hr over 30 Minutes Intravenous Every 12 hours 05/26/20 1750 05/29/20 0944   2020/05/24 2100  cefoTEtan (CEFOTAN) 1 g in sodium chloride 0.9 % 100 mL IVPB        1 g 200 mL/hr over 30 Minutes Intravenous On call to O.R. May 24, 2020 2008 05-24-20 2112       Assessment/Plan POD11, s/p ex lap with SBR  secondary to adhesive band with closed loop SBO, Dr. Maisie Fus on May 24, 2020 -comfort measures in place -no further surgical intervention  Lactic acidosis 2/2 ischemic bowel - Cleared on follow up lactic acid level 12/6  Acute blood loss anemia combined with dilutional anemia - no further labs  Non-severe moderate degree of malnutrition, present on admission -TNA stopped 12/15  Post-extubation stridor/VDRF/PNA -terminally extubated on 12/15 for comfort measures  Atrial fibrillation with rapid ventricular response - Appreciate critical care team's assistance    Hypothyroidism - home medications  Hyperglycemia due to acute illness - SSI  FEN: none ID: DC VTE: DC Foley: Continue Dispo: 6E for comfort care.  Patient  appears to be very comfortable and will leave on fentanyl gtt.  Patient appears to be going through active dying stages.  Appreciate CCM and palliative's assistance with this patient.   LOS: 11 days    Letha Cape , [facility] Surgery 06/03/2020, 8:24 AM Please see Amion for pager number during day hours 7:00am-4:30pm or 7:00am -11:30am on weekends

## 2020-06-03 NOTE — TOC Progression Note (Signed)
Transition of Care [facility]) - Progression Note    Patient Details  Name: Lisa Murray MRN: [id number] Date of Birth: [birth year]  Transition of Care [facility]) CM/SW Contact  Golda Acre, RN Phone Number: 06/03/2020, 10:41 AM  Clinical Narrative:    11 Days Post-Op  Subjective: Patient terminally extubated last night.  She appears very comfortable this morning.  No agitation and is otherwise sedated and does not respond to voice or touch.   Recommendations/Plan:  Plan for one-way extubation and transition to comfort care after family has chance to visit with her.  Discussed with her nephew at bedside and plan is for them to visit for a while and then they are planning on leaving prior to extubation without plan to return after she is extubated.   Family will notify RN at time they are leaving and will proceed with extubation following this. Plan folloing for hospital death/temp 101.0 this am,   Expected Discharge Plan: Home/Self Care Barriers to Discharge: No Barriers Identified  Expected Discharge Plan and Services Expected Discharge Plan: Home/Self Care   Discharge Planning Services: CM Consult   Living arrangements for the past 2 months: Single Family Home                                       Social Determinants of Health (SDOH) Interventions    Readmission Risk Interventions No flowsheet data found.

## 2020-06-19 NOTE — Progress Notes (Signed)
Daily Progress Note   Patient Name: Lisa Murray       Date: 06/05/2020 DOB: [birth year]  Age: 87 y.o. MRN#: [id number] Attending Physician: No att. providers found Primary Care Physician: Mila Palmer, MD Admit Date: 06-06-2020  Reason for Consultation/Follow-up: Establishing goals of care and Withdrawal of life-sustaining treatment  Subjective: I saw and examined Ms. Schey.  She is actively dying in no distress.  While she appears comfortable, her breathing is changing with increased apnea and more gasping type respirations.  I called and left a voicemail for her nephew.  He called back and we discussed plan for continuation of comfort measures.  Length of Stay: 12  Physical Exam         General: Somnolent. HEENT: No bruits, no goiter, no JVD Heart: Regular.  No murmur. Lungs: Decreased air movement, some periods of apnea noted. Abdomen: Soft, nontender, nondistended, positive bowel sounds.  Ext: No significant edema Skin: Warm and dry  Vital Signs: BP (!) 97/50 (BP Location: Right Arm)   Pulse 65   Temp 98.1 F (36.7 C) (Oral)   Resp 18   Ht 5\' 2"  (1.575 m)   Wt 51.5 kg   SpO2 (!) 86%   BMI 20.77 kg/m  SpO2: SpO2: (!) 86 % O2 Device: O2 Device: Room Air O2 Flow Rate: O2 Flow Rate (L/min): 15 L/min  Intake/output summary:  No intake or output data in the 24 hours ending 06/05/20 0956 LBM: Last BM Date: 06/02/20 Baseline Weight: Weight: 50.8 kg Most recent weight: Weight: 51.5 kg       Palliative Assessment/Data:    Flowsheet Rows   Flowsheet Row Most Recent Value  Intake Tab   Referral Department Surgery  Unit at Time of Referral ICU  Palliative Care Primary Diagnosis Other (Comment)  [GI]  Date Notified 05/31/20  Palliative Care Type New Palliative  care  Reason for referral Clarify Goals of Care  Date of Admission 06-06-20  Date first seen by Palliative Care 06/01/20  # of days Palliative referral response time 1 Day(s)  # of days IP prior to Palliative referral 8  Clinical Assessment   Palliative Performance Scale Score 20%  Psychosocial & Spiritual Assessment   Palliative Care Outcomes   Patient/Family meeting held? Yes  Who was at the meeting? Nephew via phone  Palliative Care Outcomes Clarified goals of care      Patient Active Problem List   Diagnosis Date Noted  . Aspiration into airway   . Acute respiratory failure with hypoxia (HCC)   . Atrial fibrillation with rapid ventricular response (HCC)   . Pressure injury of skin 05/26/2020  . Malnutrition of moderate degree 05/25/2020  . Small bowel obstruction (HCC) 06/11/2020  . SBO (small bowel obstruction) (HCC) 06/14/2020  . Impaired mobility and ADLs 02/27/2020  . Frequent falls 02/27/2020  . Hypothyroidism 02/27/2020  . Senile osteoporosis 02/27/2020  . Dizziness 02/27/2020  . Slow transit constipation 02/27/2020  . Multiple rib fractures 02/19/2020    Palliative Care Assessment & Plan  Recommendations/Plan:  Ms. Mcqueary is actively dying.  Her physical exam has changed more today.  I talked with her nephew and we discussed that prognosis at this point is likely hours to possibly a couple of days at best.  Goals of Care and Additional Recommendations:  Limitations on Scope of Treatment: Full Comfort Care  Code Status:    Code Status Orders  (From admission, onward)         Start     Ordered   06/02/20 1610  DNR (Do not attempt resuscitation)  Continuous       Question Answer Comment  In the event of cardiac or respiratory ARREST Do not call a "code blue"   In the event of cardiac or respiratory ARREST Do not perform Intubation, CPR, defibrillation or ACLS   In the event of cardiac or respiratory ARREST Use medication by any route, position, wound  care, and other measures to relive pain and suffering. May use oxygen, suction and manual treatment of airway obstruction as needed for comfort.      06/02/20 1609        Code Status History    Date Active Date Inactive Code Status Order ID Comments User Context   06/02/2020 1339 06/02/2020 1609 DNR [id number]  Jeanella Craze, NP Inpatient   05/31/2020 1244 06/02/2020 1338 Partial Code [id number]  Tobey Grim, NP Inpatient   05/24/2020 2328 05/31/2020 1244 Full Code [id number]  Romie Levee, MD Inpatient   02/27/2020 0602 06/10/2020 1621 Full Code [id number]  Kermit Balo, DO Outpatient   02/19/2020 1406 02/21/2020 1703 Full Code [id number]  [facility]   Advance Care Planning Activity    Advance Directive Documentation   Flowsheet Row Most Recent Value  Type of Advance Directive Healthcare Power of Attorney, Living will  Pre-existing out of facility DNR order (yellow form or pink MOST form) --  "MOST" Form in Place? --       Prognosis:   Hours - Days  Discharge Planning:  Anticipated Hospital Death  Care plan was discussed with nephew via phone Thank you for allowing the Palliative Medicine Team to assist in the care of this patient.   Total Time 15 Prolonged Time Billed No   Greater than 50%  of this time was spent counseling and coordinating care related to the above assessment and plan.  Romie Minus, MD  Please contact Palliative Medicine Team phone at [phone] for questions and concerns.

## 2020-06-19 NOTE — Progress Notes (Signed)
12 Days Post-Op  Subjective: Patient with increase work up of breathing but otherwise comfortable.   ROS: See above, otherwise other systems negative  Objective: Vital signs in last 24 hours: Temp:  [97.8 F (36.6 C)-98.1 F (36.7 C)] 98.1 F (36.7 C) (12/16 2244) Pulse Rate:  [65-66] 65 (12/16 2244) Resp:  [14-18] 18 (12/16 2244) BP: (97-114)/(50-58) 97/50 (12/16 2244) SpO2:  [86 %-93 %] 86 % (12/16 2244) Last BM Date: 06/02/20  Intake/Output from previous day: 12/16 0701 - 12/17 0700 In: -  Out: 3800 [Urine:3800] Intake/Output this shift: No intake/output data recorded.  PE: Gen: very comfortable Lungs: loud upper airway secretions  Lab Results:  Recent Labs    06/01/20 1618 06/02/20 0200  WBC 30.9* 33.8*  HGB 9.3* 9.8*  HCT 29.0* 31.5*  PLT 216 249   BMET Recent Labs    06/01/20 1616 06/02/20 0200  NA 145 147*  K 3.5 4.0  CL 108 112*  CO2 26 25  GLUCOSE 120* 173*  BUN 54* 53*  CREATININE 0.87 0.67  CALCIUM 9.8 9.8   PT/INR Recent Labs    06/02/20 0200  LABPROT 13.6  INR 1.1   CMP     Component Value Date/Time   NA 147 (H) 06/02/2020 0200   NA 141 03/01/2020 0000   K 4.0 06/02/2020 0200   CL 112 (H) 06/02/2020 0200   CO2 25 06/02/2020 0200   GLUCOSE 173 (H) 06/02/2020 0200   BUN 53 (H) 06/02/2020 0200   BUN 10 03/01/2020 0000   CREATININE 0.67 06/02/2020 0200   CALCIUM 9.8 06/02/2020 0200   PROT 6.7 06/02/2020 0200   ALBUMIN 3.0 (L) 06/02/2020 0200   AST 98 (H) 06/02/2020 0200   ALT 42 06/02/2020 0200   ALKPHOS 109 06/02/2020 0200   BILITOT 0.9 06/02/2020 0200   GFRNONAA >60 06/02/2020 0200   GFRAA  05/30/2020 2055    QUESTIONABLE RESULTS, RECOMMEND RECOLLECT TO VERIFY   Lipase     Component Value Date/Time   LIPASE 21 06/20/20 1721       Studies/Results: No results found.  Anti-infectives: Anti-infectives (From admission, onward)   Start     Dose/Rate Route Frequency Ordered Stop   06/01/20 1400  ceFAZolin  (ANCEF) IVPB 2g/100 mL premix  Status:  Discontinued        2 g 200 mL/hr over 30 Minutes Intravenous Every 8 hours 06/01/20 0958 06/01/20 1245   06/01/20 1345  ceFEPIme (MAXIPIME) 2 g in sodium chloride 0.9 % 100 mL IVPB  Status:  Discontinued        2 g 200 mL/hr over 30 Minutes Intravenous Every 12 hours 06/01/20 1245 06/02/20 1757   05/31/20 2200  ceFAZolin (ANCEF) IVPB 2g/100 mL premix  Status:  Discontinued        2 g 200 mL/hr over 30 Minutes Intravenous Every 12 hours 05/31/20 0837 06/01/20 0958   05/29/20 1800  ceFAZolin (ANCEF) IVPB 2g/100 mL premix  Status:  Discontinued        2 g 200 mL/hr over 30 Minutes Intravenous Every 12 hours 05/29/20 0951 05/29/20 0954   05/29/20 1800  ceFAZolin (ANCEF) IVPB 2g/100 mL premix  Status:  Discontinued        2 g 200 mL/hr over 30 Minutes Intravenous Every 12 hours 05/29/20 0954 05/29/20 1512   05/29/20 1600  ceFAZolin (ANCEF) IVPB 2g/100 mL premix  Status:  Discontinued        2 g 200 mL/hr over 30 Minutes  Intravenous Every 8 hours 05/29/20 1512 05/31/20 0837   05/27/20 2000  vancomycin (VANCOREADY) IVPB 500 mg/100 mL  Status:  Discontinued        500 mg 100 mL/hr over 60 Minutes Intravenous Every 24 hours 05/26/20 1800 05/29/20 0951   05/26/20 2000  vancomycin (VANCOCIN) IVPB 1000 mg/200 mL premix        1,000 mg 200 mL/hr over 60 Minutes Intravenous  Once 05/26/20 1752 05/26/20 2245   05/26/20 1830  ceFEPIme (MAXIPIME) 2 g in sodium chloride 0.9 % 100 mL IVPB  Status:  Discontinued        2 g 200 mL/hr over 30 Minutes Intravenous Every 12 hours 05/26/20 1750 05/29/20 0944   06/16/2020 2100  cefoTEtan (CEFOTAN) 1 g in sodium chloride 0.9 % 100 mL IVPB        1 g 200 mL/hr over 30 Minutes Intravenous On call to O.R. 05/21/2020 2008 05/25/2020 2112       Assessment/Plan POD12, s/p ex lap with SBR secondary to adhesive band with closed loop SBO, Dr. Maisie Fus on 05/24/2020 -comfort measures in place -no further surgical  intervention  Lactic acidosis 2/2 ischemic bowel - Cleared on follow up lactic acid level 12/6  Acute blood loss anemia combined with dilutional anemia - no further labs  Non-severe moderate degree of malnutrition, present on admission -TNA stopped 12/15  Post-extubation stridor/VDRF/PNA -terminally extubated on 12/15 for comfort measures  Atrial fibrillation with rapid ventricular response - Appreciate critical care team's assistance    Hypothyroidism - home medications  Hyperglycemia due to acute illness - SSI  FEN: none ID: DC VTE: DC Foley: Continue Dispo: 6E for comfort care.     LOS: 12 days    Letha Cape , [facility] Surgery Jun 07, 2020, 10:11 AM Please see Amion for pager number during day hours 7:00am-4:30pm or 7:00am -11:30am on weekends

## 2020-06-19 NOTE — Care Management Important Message (Signed)
Important Message  Patient Details IM Letter given to the Patient. Name: Lisa Murray MRN: [id number] Date of Birth: August 03, 1930   Medicare Important Message Given:  Yes     Caren Macadam 06/15/20, 9:47 AM

## 2020-06-19 NOTE — Progress Notes (Signed)
Patient found unresponsive. Time of death October 08, 2028. Patient on comfort care. MD notified. Dan Maker, nephew notified. Trey Paula would like for patient to be sent with Nadine Counts at [facility] [employer]. Phone number is [phone].

## 2020-06-19 NOTE — Discharge Summary (Signed)
Patient ID: Lisa Murray [id number] 1931/03/22 87 y.o.  Admit date: 06/09/2020 Death date: 06/21/2020  Admitting Diagnosis: Closed loop SBO   Discharge Diagnosis Patient Active Problem List   Diagnosis Date Noted  . Aspiration into airway   . Acute respiratory failure with hypoxia (HCC)   . Atrial fibrillation with rapid ventricular response (HCC)   . Pressure injury of skin 05/26/2020  . Malnutrition of moderate degree 05/25/2020  . Small bowel obstruction (HCC) 06-09-2020  . SBO (small bowel obstruction) (HCC) 06/09/20  . Impaired mobility and ADLs 02/27/2020  . Frequent falls 02/27/2020  . Hypothyroidism 02/27/2020  . Senile osteoporosis 02/27/2020  . Dizziness 02/27/2020  . Slow transit constipation 02/27/2020  . Multiple rib fractures 02/19/2020  s/p ex lap with SBR  Consultants CCM Palliative care medicine  Reason for Admission: Lisa Murray is an 87 y.o. female who is here for acute onset abd pain that started ~4am this morning.  She has had one episode of emesis.  Her pain is worsening.  It has been constant.  She admits to some recent constipation as well.  Surgical history significant for abdominal hysterectomy appendectomy and bladder repair.  Patient lives independently.  She is status post a fall earlier in the year requiring hospitalization and rehab.  Procedures Ex lap with SBR, Dr. Maisie Fus 06/09/2020  Hospital Course:  POD12, s/p ex lap with SBR secondary to adhesive band with closed loop SBO, Dr. Maisie Fus on 06-09-2020 Patient was admitted and sent to the ICU postoperatively.  She failed multiple attempts at extubation.  An NGT remained in place due to a post op ileus.  Unfortunately, the patient never recovered post operatively more from a pulmonary standpoint than abdominal standpoint, and ultimately continued to worsen.  Palliative care was consulted and after several discussions with next of kin, the decision was made the transition to comfort care.  Her  NGT was removed and she was transitioned to full comfort care.  She expired on 2020/06/21.  Lactic acidosis 2/2 ischemic bowel - Cleared on follow up lactic acid level 12/6  Acute blood loss anemia combined with dilutional anemia - no further labs were drawn after transition to comfort care.  See chart for lab values.  Non-severe moderate degree of malnutrition, present on admission -TNA stopped 12/15  Post-extubation stridor/VDRF/PNA The patient remained intubated post operatively, thought to be secondary to a reaction to the IV contrast from her CT scan as she had some stridor.  She was given racemic epi but required reintubation several times.  she ultimately continued to deteriorate and likely developed aspiration PNA after pulling out her NGT.  The decision was made to pursue comfort care and she was terminally extubated on 12/15 for comfort measures.  Atrial fibrillation with rapid ventricular response - see chart for details.   Hypothyroidism - home medications  Hyperglycemia due to acute illness - SSI   Allergies as of Jun 21, 2020      Reactions   Iodine Hives   Codeine Nausea And Vomiting   Contrast Media [iodinated Diagnostic Agents]    Doxycycline Nausea And Vomiting   Fosamax [alendronate] Nausea And Vomiting   Macrobid [nitrofurantoin] Nausea And Vomiting   Tramadol Nausea And Vomiting   Erythromycin Rash   Morphine And Related Anxiety   Novocain [procaine] Anxiety   Penicillin G Benzathine Rash   Sulfa Antibiotics Rash      Medication List    ASK your doctor about these medications   acetaminophen 650 MG  CR tablet Commonly known as: TYLENOL Take 650 mg by mouth every 6 (six) hours as needed for pain.   ALPRAZolam 0.25 MG tablet Commonly known as: XANAX Take 1-2 tablets (0.25-0.5 mg total) by mouth See admin instructions. Take 1 tablet (0.25 mg) in the morning and take 2 tablet (0.50mg )at night   amLODipine 2.5 MG tablet Commonly known as:  NORVASC Take 1 tablet (2.5 mg total) by mouth daily.   Azelastine HCl 0.15 % Soln Place 2 sprays into both nostrils daily.   Biofreeze 4 % Gel Generic drug: Menthol (Topical Analgesic) Apply topically.   bisacodyl 10 MG suppository Commonly known as: DULCOLAX Place 10 mg rectally as needed for moderate constipation.   buPROPion 150 MG 12 hr tablet Commonly known as: WELLBUTRIN SR Take 1 tablet (150 mg total) by mouth 2 (two) times daily.   denosumab 60 MG/ML Sosy injection Commonly known as: PROLIA Inject 60 mg into the skin every 6 (six) months.   docusate sodium 100 MG capsule Commonly known as: COLACE Take 1 capsule (100 mg total) by mouth 2 (two) times daily.   esomeprazole 20 MG capsule Commonly known as: NEXIUM Take 20 mg by mouth daily at 12 noon.   FISH OIL PO Take 1 capsule by mouth daily.   guaiFENesin 600 MG 12 hr tablet Commonly known as: MUCINEX Take by mouth 2 (two) times daily. 1 tablet by mouth every 12 hours as needed for mucous secretions.   METAMUCIL FIBER PO Take by mouth. Mix 1 packet with 8 oz of water, and drink daily for constipation. Hold for loose stool   MILK OF MAGNESIA PO Take 30 mLs by mouth as needed.   mirtazapine 30 MG disintegrating tablet Commonly known as: REMERON SOL-TAB Take 1 tablet (30 mg total) by mouth at bedtime.   multivitamin with minerals Tabs tablet Take 1 tablet by mouth daily.   OSCAL 500/200 D-3 PO Take 1 tablet by mouth daily.   polyethylene glycol 17 g packet Commonly known as: MiraLax Take 17 g by mouth daily as needed.   RA SALINE ENEMA RE Place rectally as needed.   rosuvastatin 10 MG tablet Commonly known as: CRESTOR Take 1 tablet (10 mg total) by mouth daily.   Synthroid 88 MCG tablet Generic drug: levothyroxine Take 1 tablet (88 mcg total) by mouth every morning.   VITAMIN D PO Take 2 capsules by mouth daily.          Signed: Barnetta Chapel, [facility]  Surgery 06/17/2020, 10:35 AM Please see Amion for pager number during day hours 7:00am-4:30pm, 7-11:30am on Weekends

## 2020-06-19 DEATH — deceased

## 2022-03-31 IMAGING — CT CT HEAD W/O CM
4 series · 16 of 47 positions shown, 18 images · non-contrast
Comparison: None.

CLINICAL DATA: Head trauma.

EXAM:
CT HEAD WITHOUT CONTRAST
TECHNIQUE: Contiguous axial images were obtained from the base of the skull
through the vertex without intravenous contrast.

[Series 3: head without · axial · non-contrast · 0.49mm/px · z∈[-205,-85]mm · 7 of 32 slices shown, 9 images]
[im 4/32  brain]
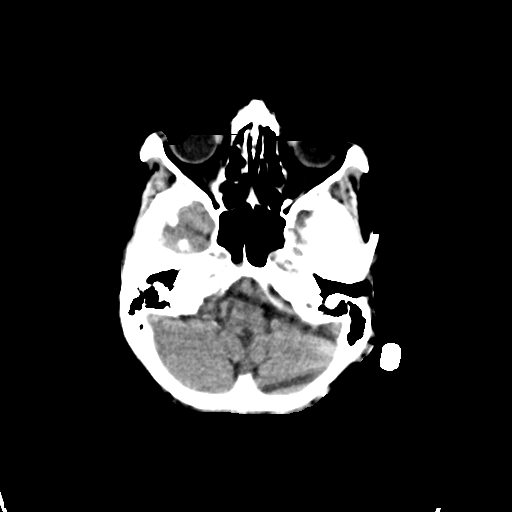
[im 4/32  bone]
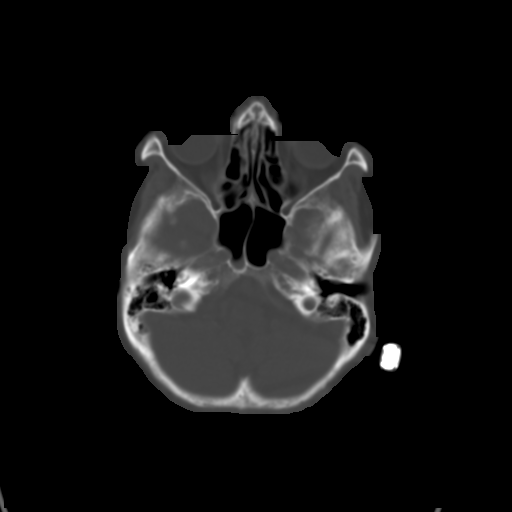
[im 8/32  brain]
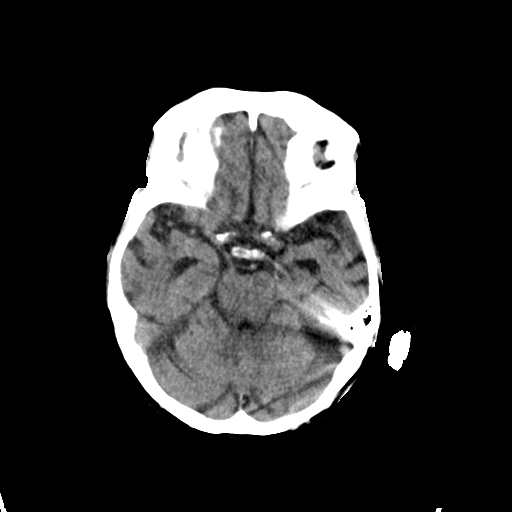
[im 12/32  brain]
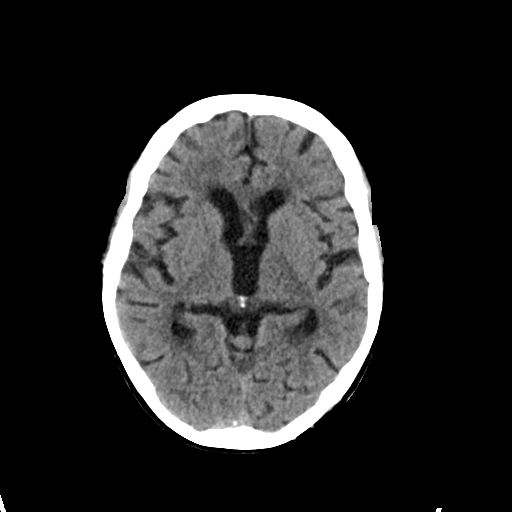
[im 16/32  brain]
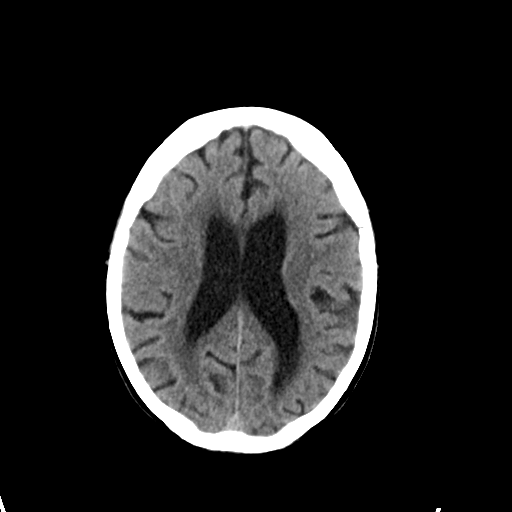
[im 20/32  brain]
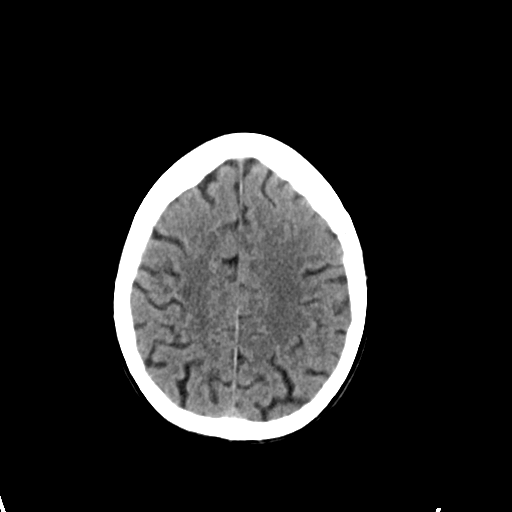
[im 20/32  bone]
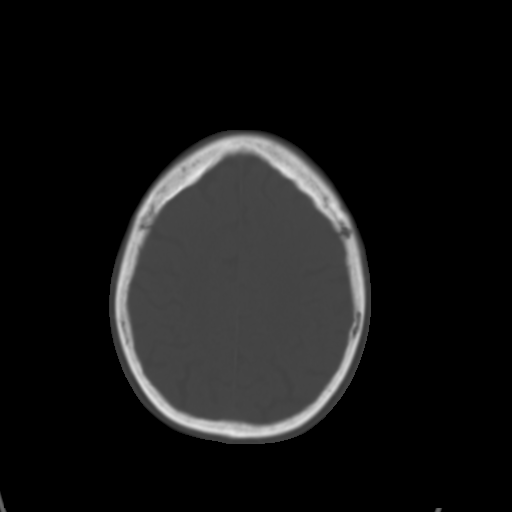
[im 24/32  brain]
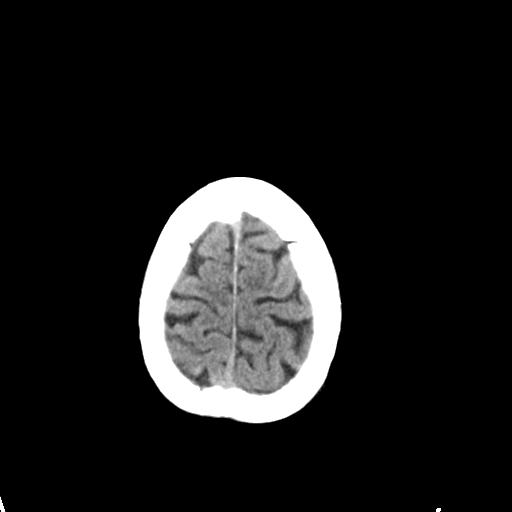
[im 28/32  brain]
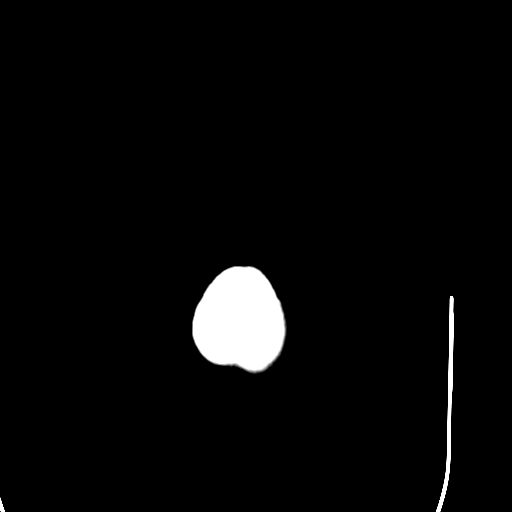

[Series 4: head bone · axial · 0.49mm/px · z∈[-206,-174]mm · 3 of 78 slices shown]
[im 8/78  bone]
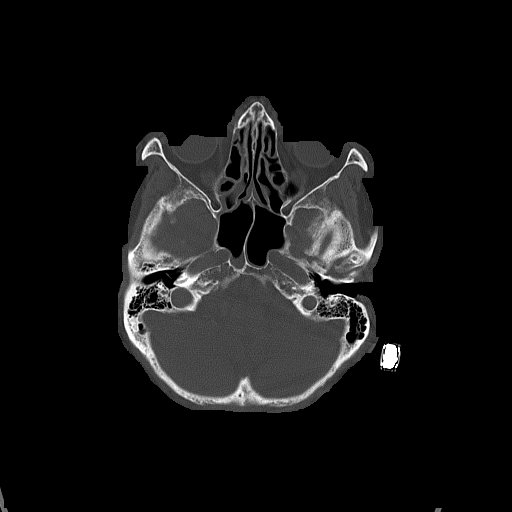
[im 16/78  bone]
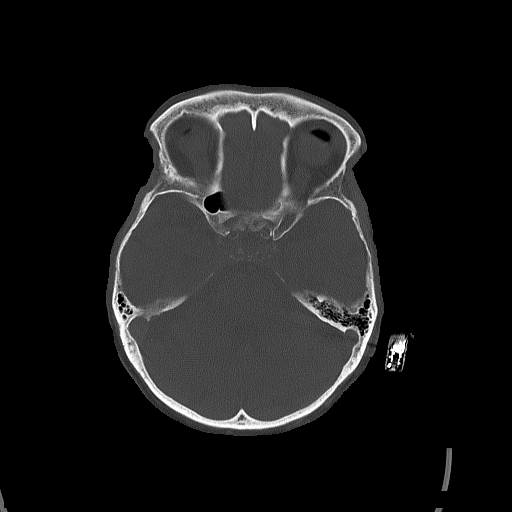
[im 24/78  bone]
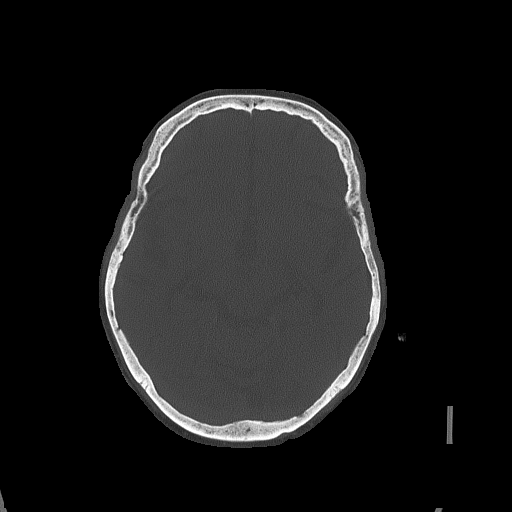

[Series 5: head without cor · coronal · non-contrast · 0.31mm/px · 3 of 67 slices shown]
[im 23/67  brain]
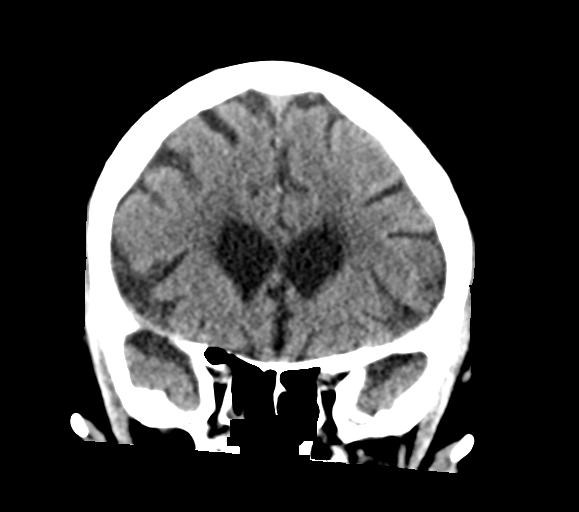
[im 30/67  brain]
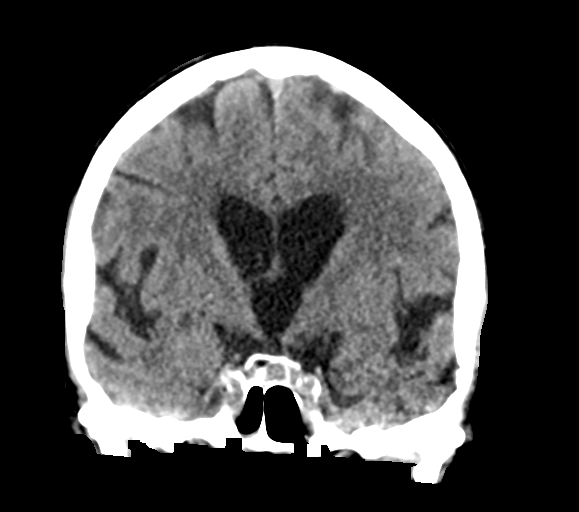
[im 37/67  brain]
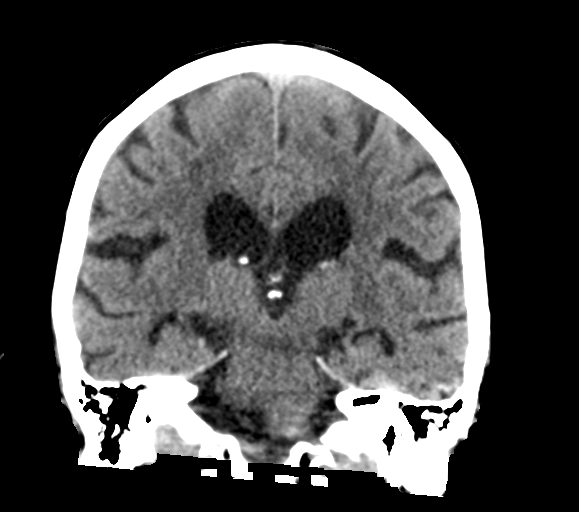

[Series 6: head without sag · sagittal · non-contrast · 0.32mm/px · 3 of 60 slices shown]
[im 21/60  brain]
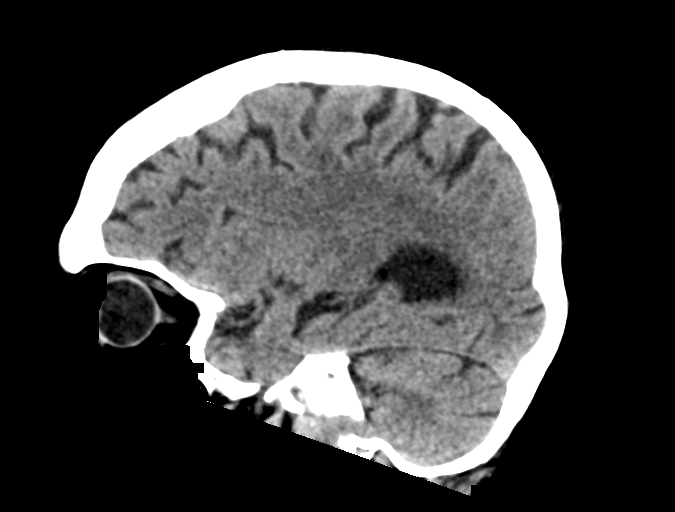
[im 31/60  brain]
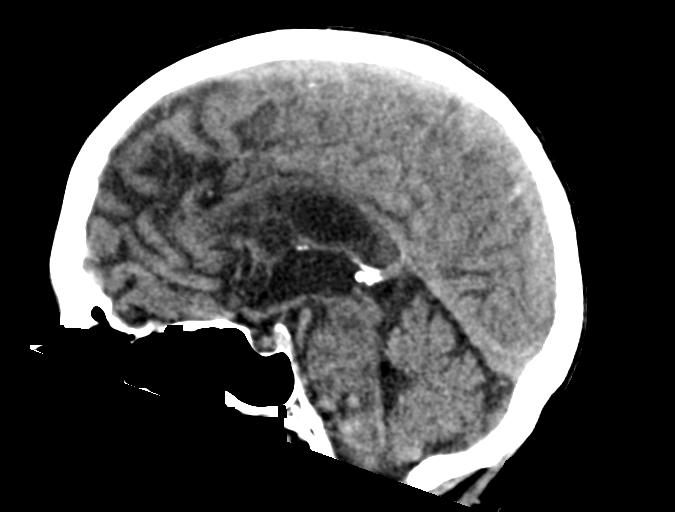
[im 40/60  brain]
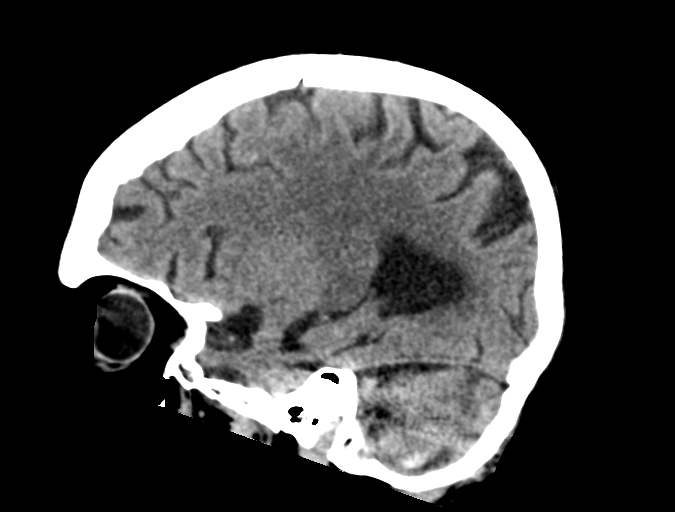

[16 of 47 positions shown; findings below may reference images not displayed]

FINDINGS: Brain: No evidence of acute large vascular territory infarction,
hemorrhage, hydrocephalus, extra-axial collection or mass
lesion/mass effect. Small hypodensity in the right thalamus. Mild to
moderate diffuse cerebral volume loss with ex vacuo ventricular
dilation. Patchy white matter hypoattenuation is nonspecific but
likely the sequela of chronic microvascular ischemic disease.

Vascular: Calcific intracranial atherosclerosis. No hyperdense
vessel identified.

Skull: Normal. Negative for fracture or focal lesion.89

Sinuses/Orbits: Mild scattered paranasal sinus mucosal thickening.

Other: No mastoid effusions. See CT chest for characterization a
findings seen on the SCOUT.
IMPRESSION: 1. No acute traumatic intracranial abnormality.
2. Small hypodensity in the right thalamus is consistent with an
age-indeterminate lacunar infarct, favored remote. MRI could further
evaluate if there is concern for acute infarct.

## 2022-03-31 IMAGING — CT CT CERVICAL SPINE W/O CM
3 series · 14 of 33 positions shown, 17 images · non-contrast
Comparison: None.

CLINICAL DATA: Neck trauma.  Fell at home.

EXAM:
CT CERVICAL SPINE WITHOUT CONTRAST
TECHNIQUE: Multidetector CT imaging of the cervical spine was performed without
intravenous contrast. Multiplanar CT image reconstructions were also
generated.

[Series 4: c_spine 2.0 st · axial · 0.28mm/px · z∈[-396,-250]mm · 6 of 95 slices shown, 8 images]
[im 15/95  soft-tissue]
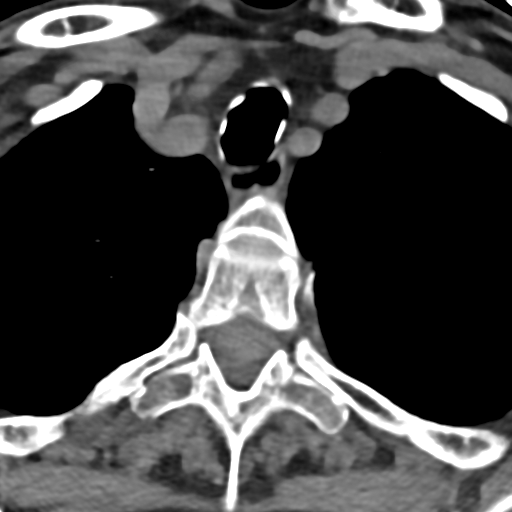
[im 15/95  bone]
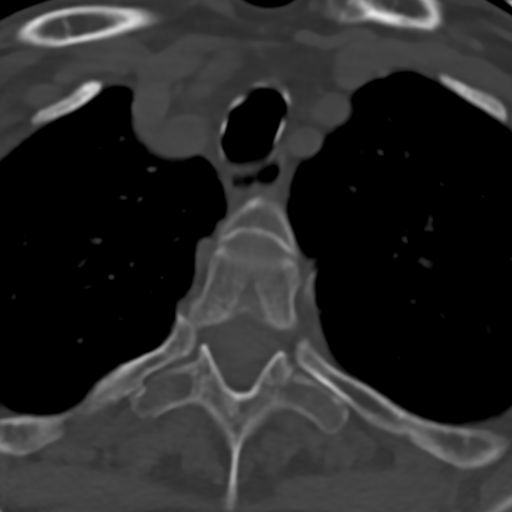
[im 29/95  bone]
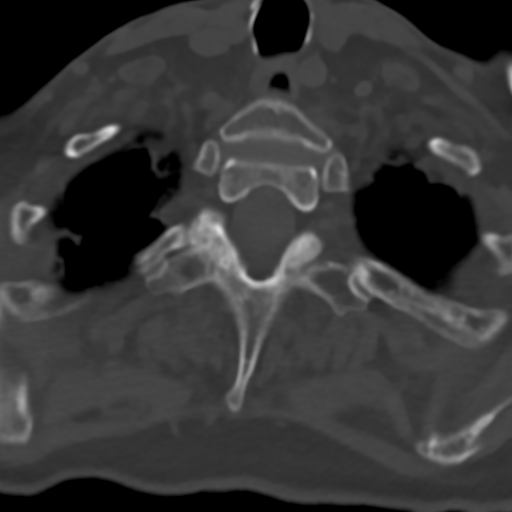
[im 44/95  bone]
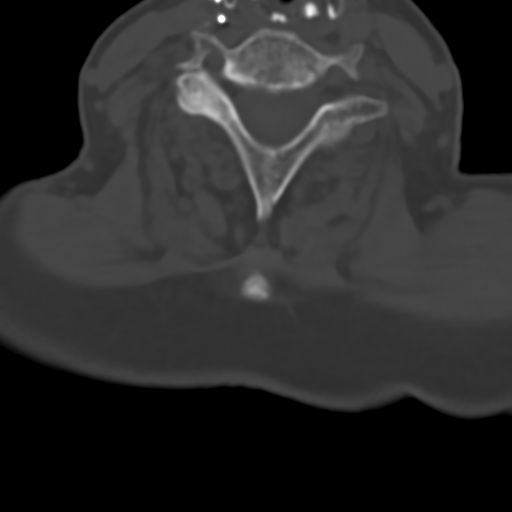
[im 58/95  bone]
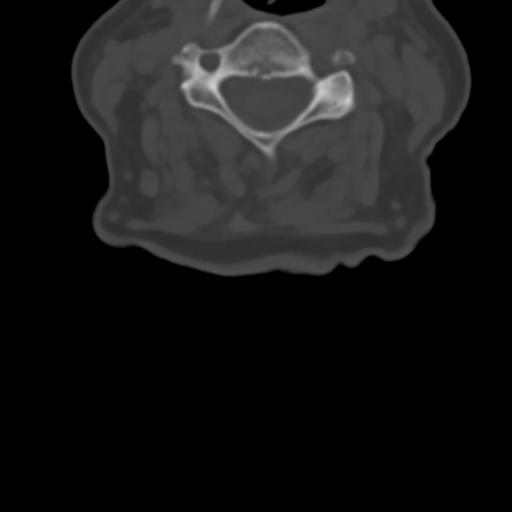
[im 73/95  soft-tissue]
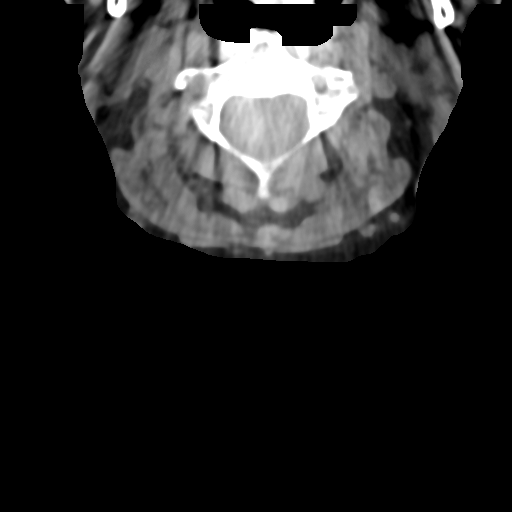
[im 73/95  bone]
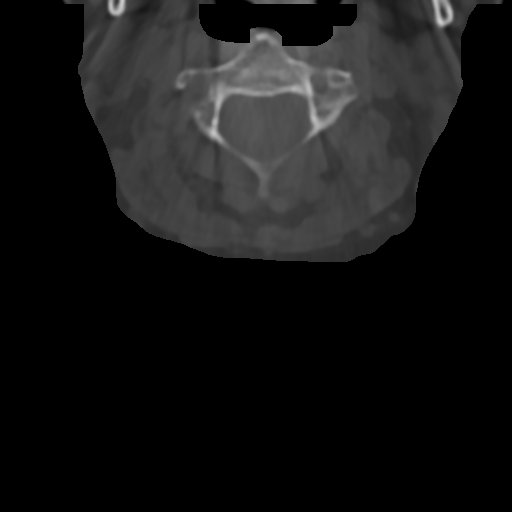
[im 87/95  bone]
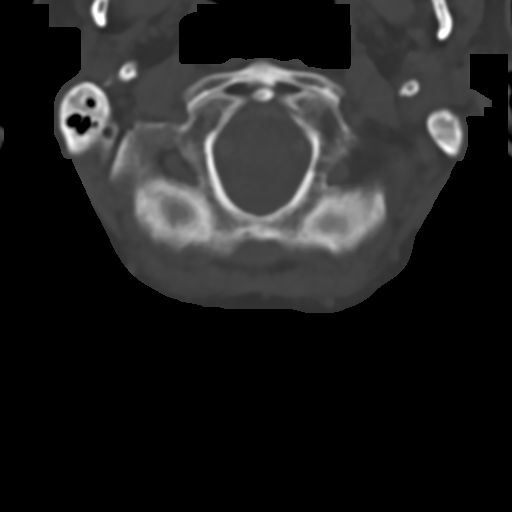

[Series 8: c_spine 2.0 sag bone · sagittal · 0.38mm/px · 5 of 61 slices shown, 6 images]
[im 21/61  bone]
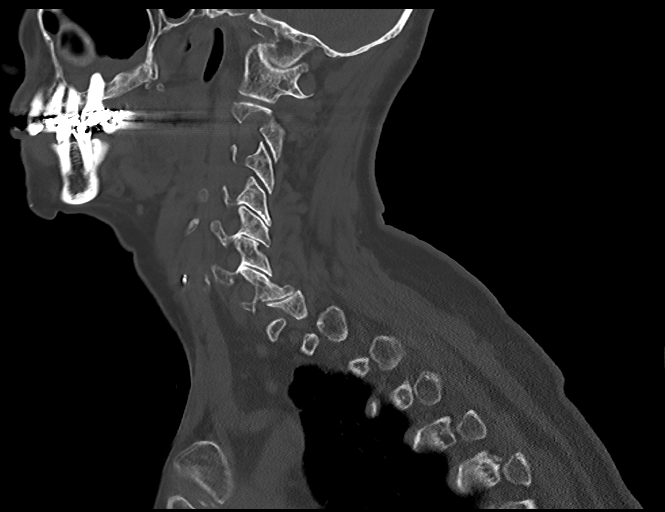
[im 26/61  bone]
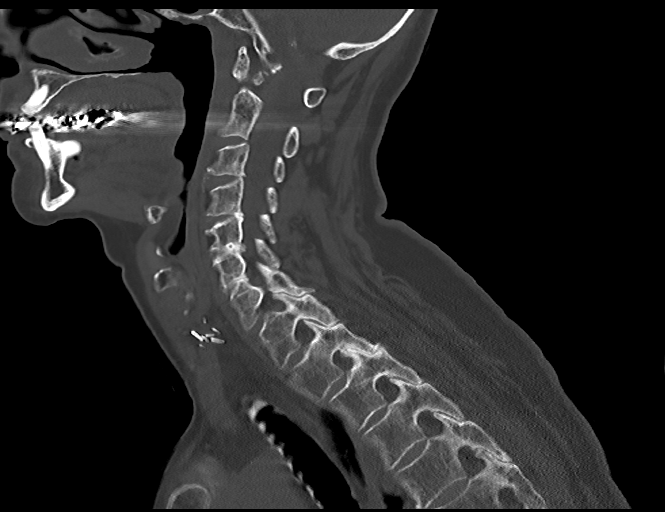
[im 31/61  soft-tissue]
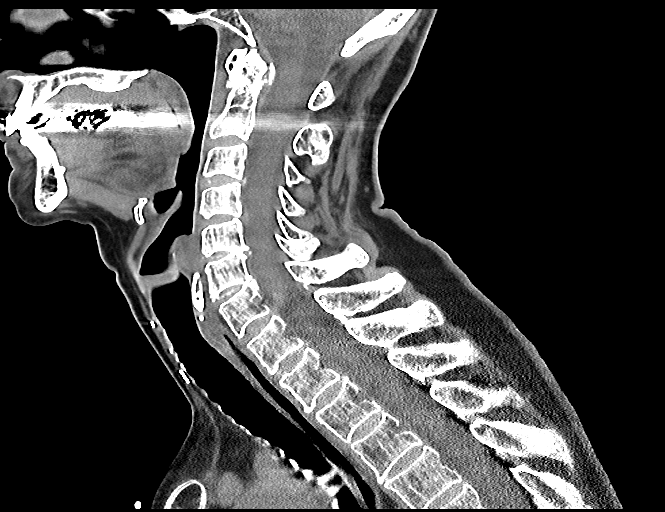
[im 31/61  bone]
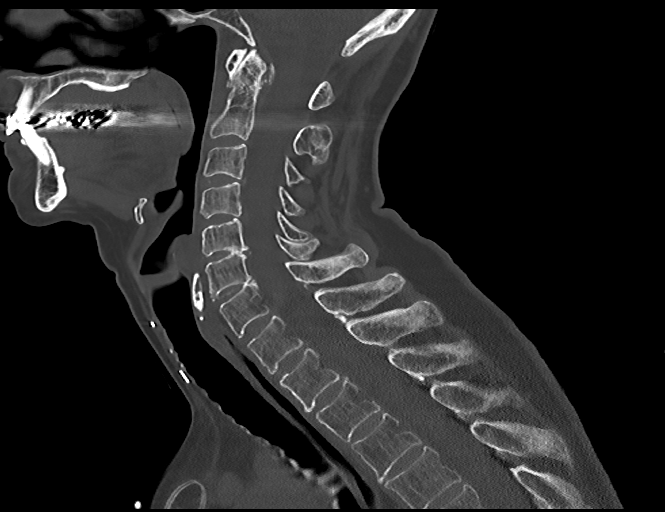
[im 36/61  bone]
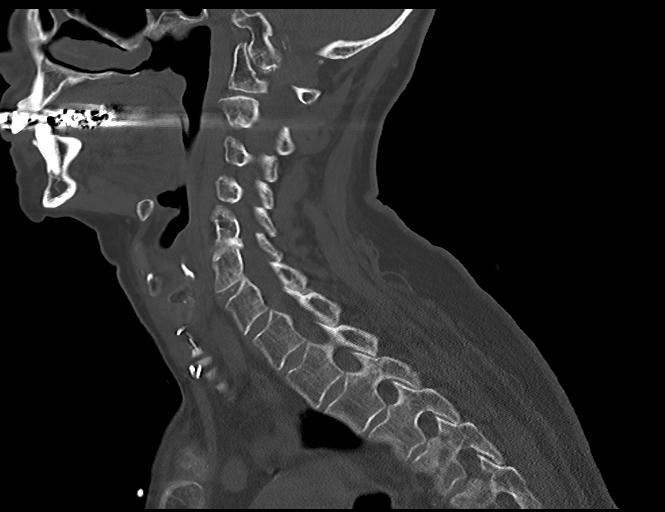
[im 41/61  bone]
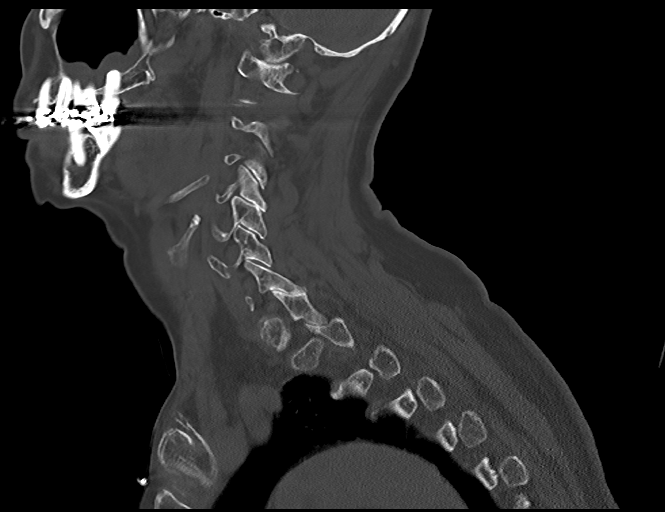

[Series 9: c_spine 2.0 cor bone · coronal · 0.28mm/px · 3 of 105 slices shown]
[im 21/105  bone]
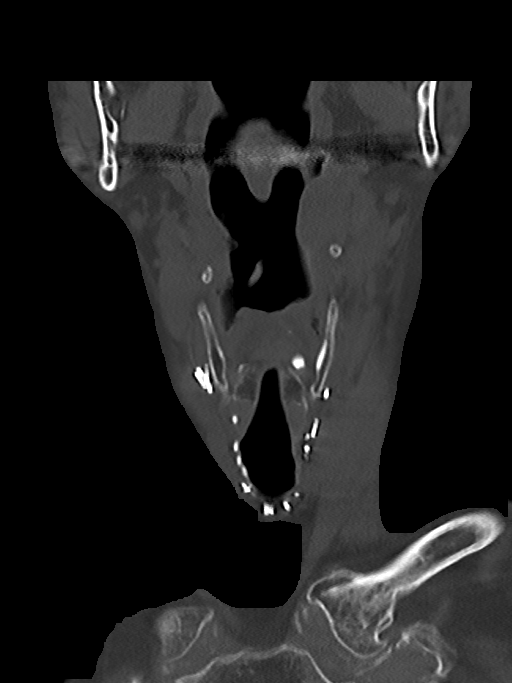
[im 42/105  bone]
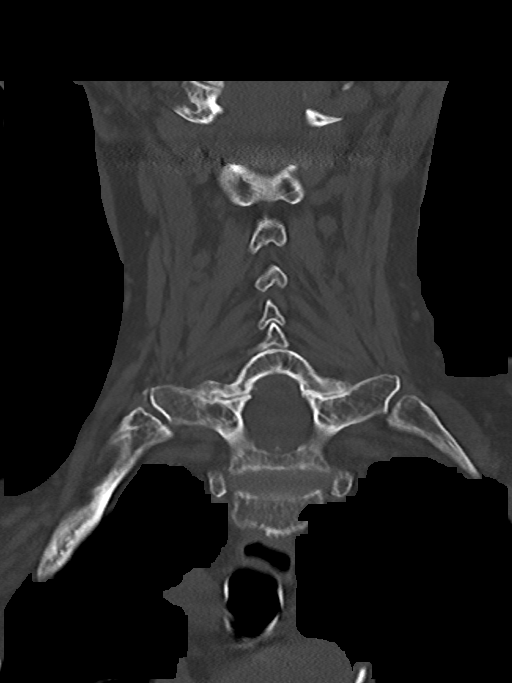
[im 63/105  bone]
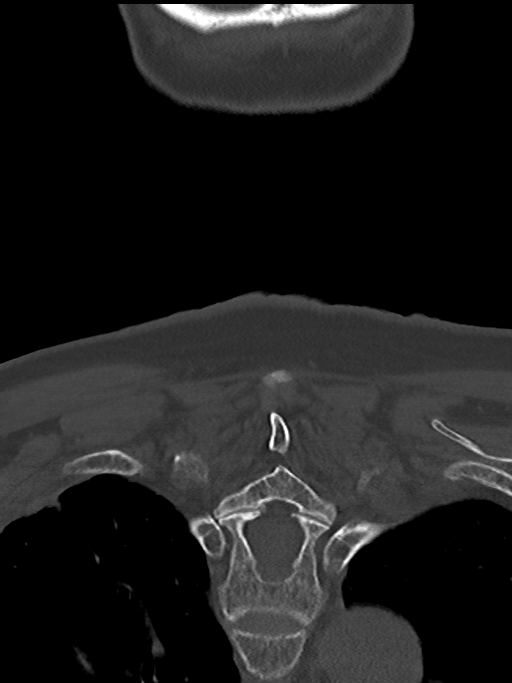

[14 of 33 positions shown; findings below may reference images not displayed]

FINDINGS: Alignment: Normal.

Skull base and vertebrae: No acute fracture. No primary bone lesion
or focal pathologic process. Diffuse osteopenia.

Soft tissues and spinal canal: No prevertebral fluid or swelling. No
visible large canal hematoma.

Degenerative changes: Craniocervical degenerative change. Multilevel
mild-to-moderate degenerative disc disease, greatest at C5-C6.
Posterior disc bulge at C4-C5 which effaces the ventral CSF and
contacts the ventral cord without evidence high-grade stenosis.

Upper chest: Negative.

Other: Atherosclerotic vascular calcifications.
IMPRESSION: 1. No evidence of acute fracture or malalignment.
2. Degenerative changes, including C4-C5 posterior disc bulge which
effaces the ventral CSF without evidence of advanced canal stenosis.

## 2022-04-03 IMAGING — DX DG CHEST 2V
2 series · 2 of 2 positions shown · non-contrast
Comparison: [DATE] [DATE], [DATE].  [DATE] [DATE], [DATE].

CLINICAL DATA: Left rib pain.

EXAM:
CHEST - 2 VIEW

[chest lat]
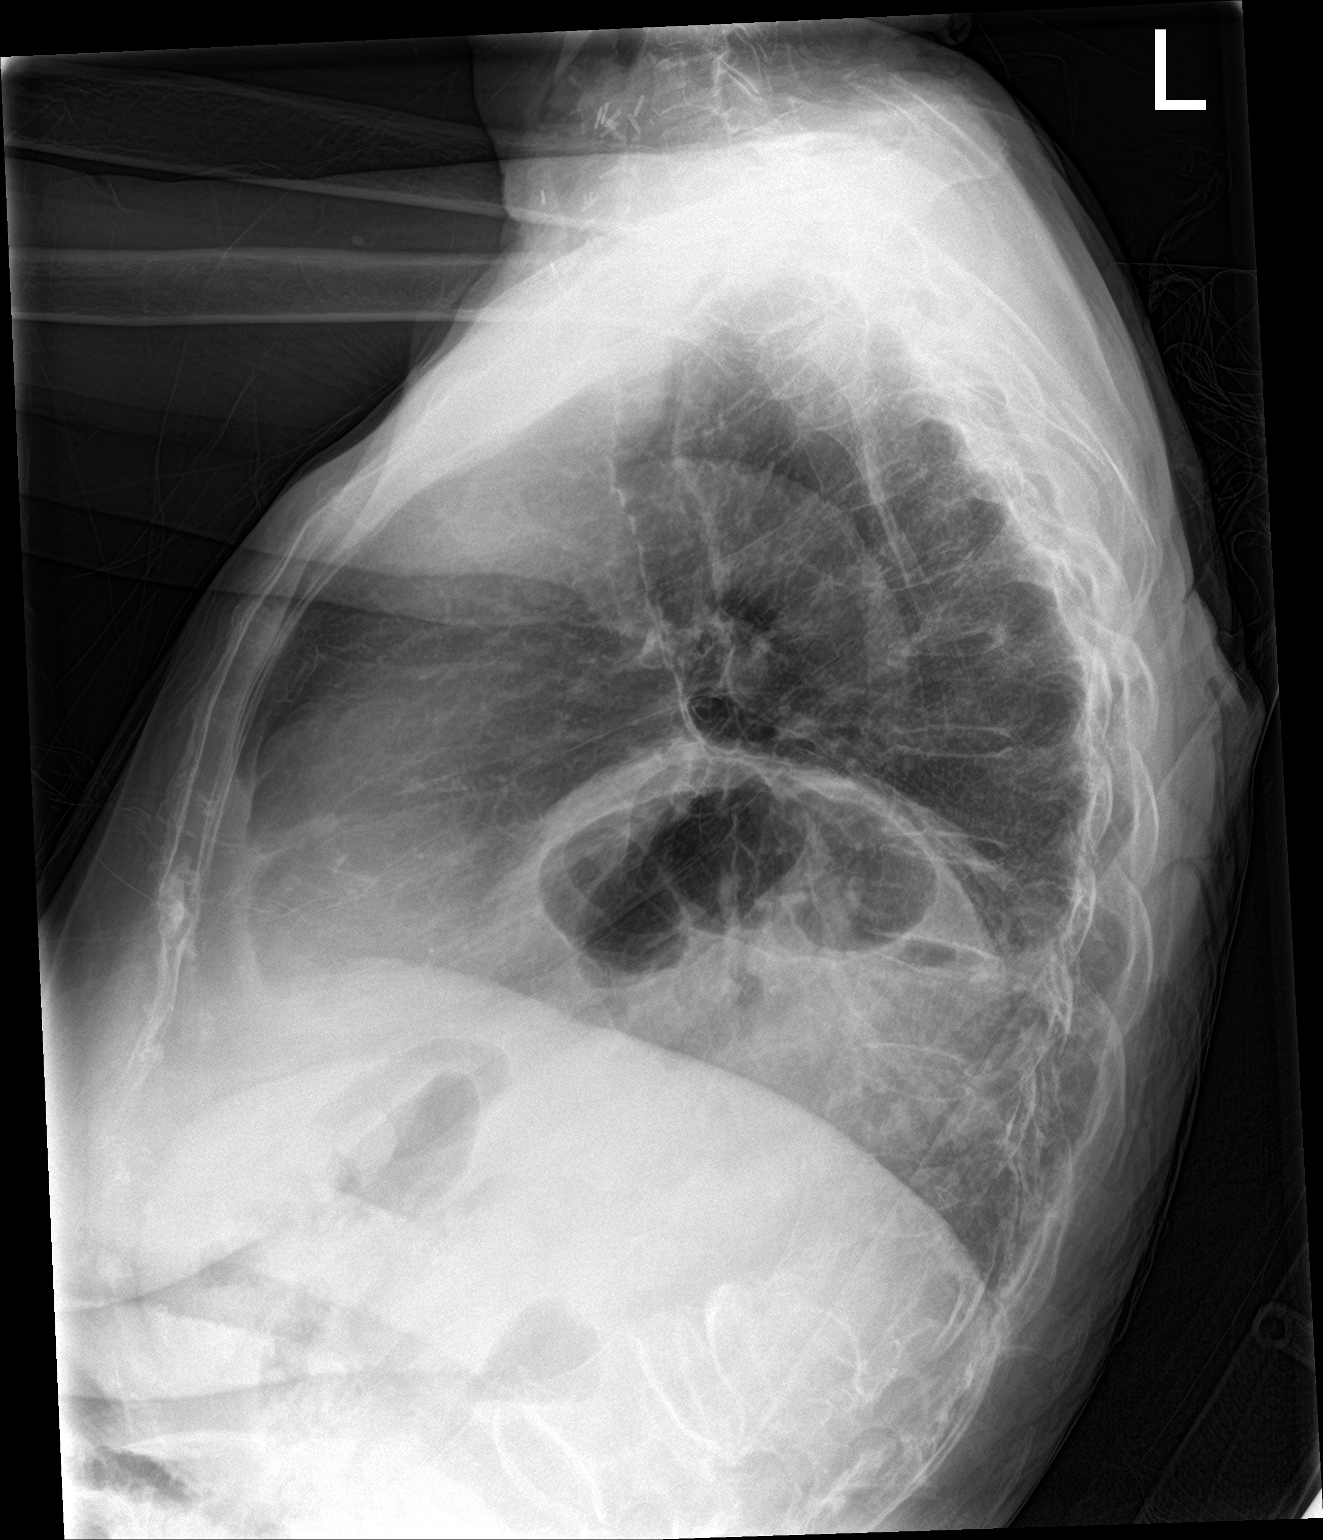

[chest ap]
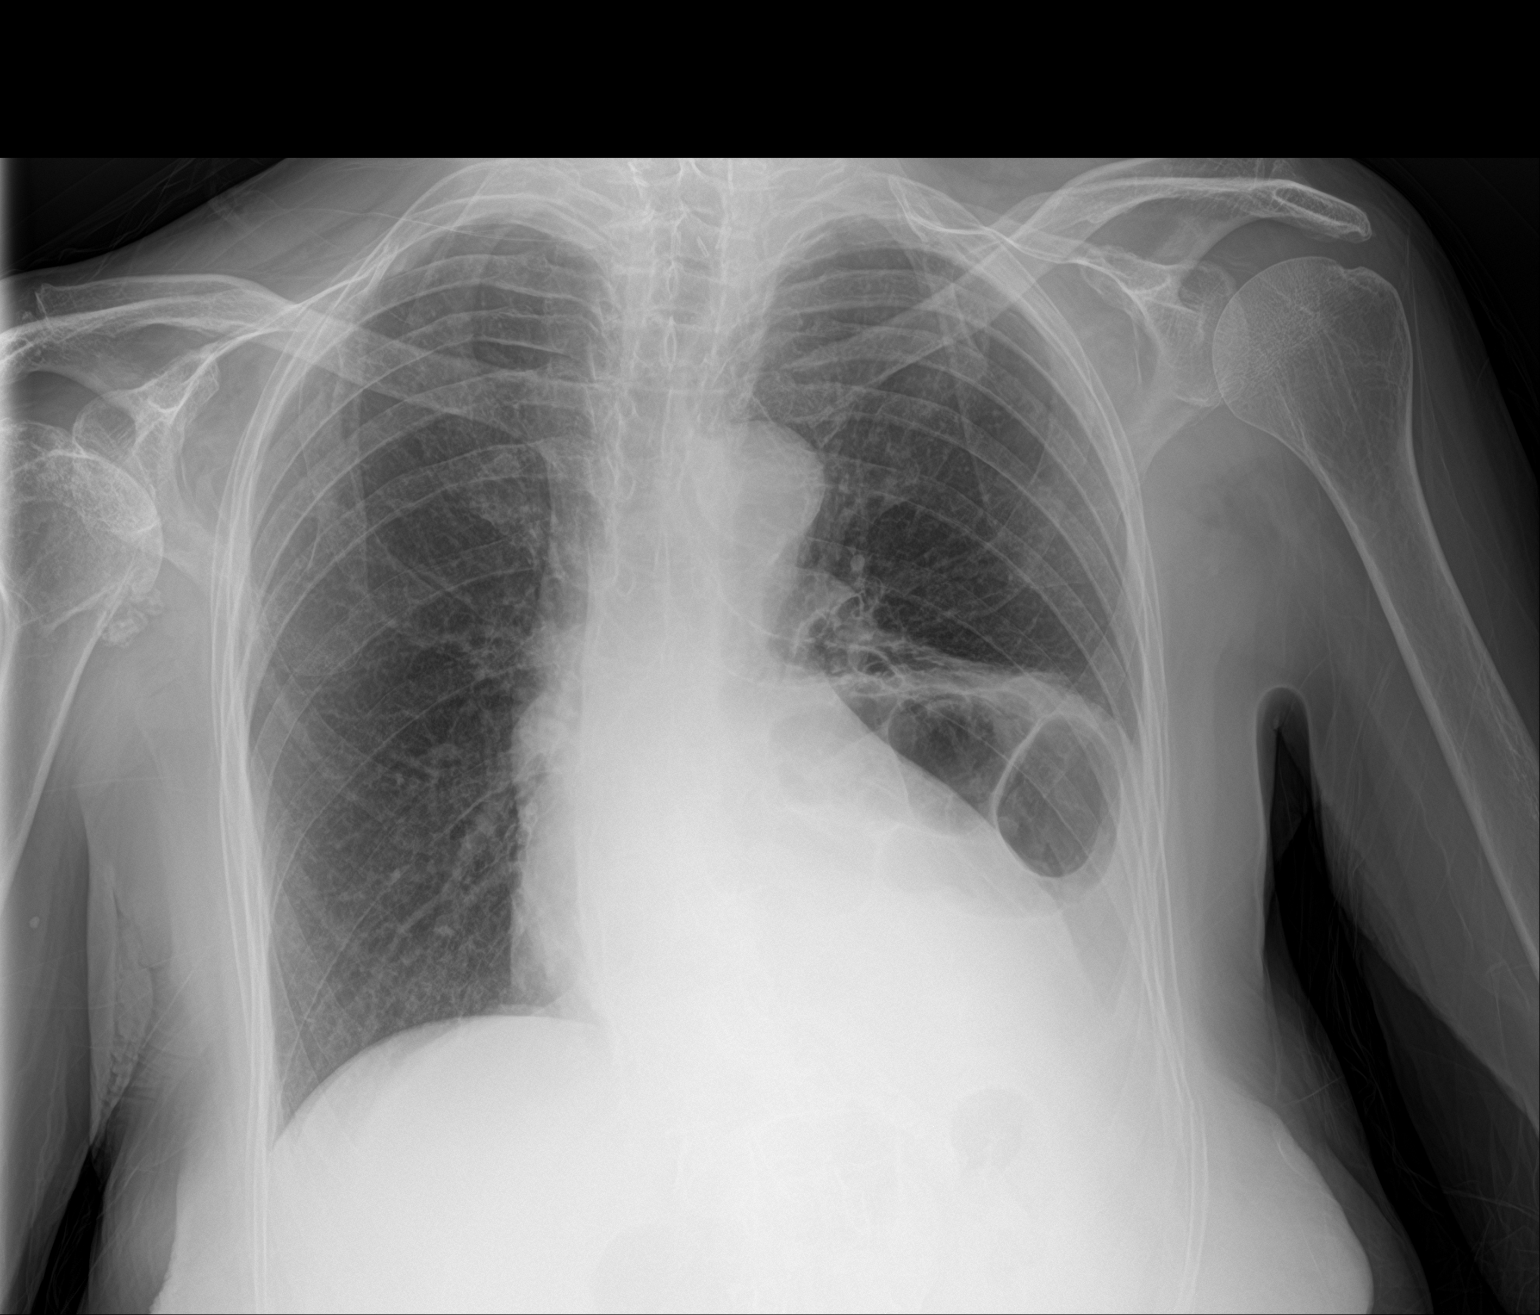

[2 of 2 positions shown; findings below may reference images not displayed]

FINDINGS: The heart size and mediastinal contours are within normal limits. No
pneumothorax is noted. Right lung is clear. Stable elevated left
hemidiaphragm is noted with mild left basilar subsegmental
atelectasis. The visualized skeletal structures are unremarkable.
IMPRESSION: Stable elevated left hemidiaphragm with mild left basilar
subsegmental atelectasis.
